# Patient Record
Sex: Female | Born: 1990 | Hispanic: No | Smoking: Never smoker
Health system: Southern US, Community
[De-identification: ages and names within clinical notes are randomized; demographics above are authoritative.]

## PROBLEM LIST (undated history)

## (undated) ENCOUNTER — Inpatient Hospital Stay (HOSPITAL_COMMUNITY): Payer: Self-pay

## (undated) DIAGNOSIS — Z3490 Encounter for supervision of normal pregnancy, unspecified, unspecified trimester: Secondary | ICD-10-CM

## (undated) DIAGNOSIS — E0590 Thyrotoxicosis, unspecified without thyrotoxic crisis or storm: Secondary | ICD-10-CM

---

## 2013-05-02 ENCOUNTER — Inpatient Hospital Stay (HOSPITAL_COMMUNITY): Payer: Medicaid Other

## 2013-05-02 ENCOUNTER — Inpatient Hospital Stay (HOSPITAL_COMMUNITY)
Admission: AD | Admit: 2013-05-02 | Discharge: 2013-05-02 | Disposition: A | Discharge status: Home / Self Care | Payer: Medicaid Other | Source: Ambulatory Visit | Attending: Family Medicine | Admitting: Family Medicine

## 2013-05-02 ENCOUNTER — Encounter (HOSPITAL_COMMUNITY): Payer: Self-pay | Admitting: Family

## 2013-05-02 DIAGNOSIS — O200 Threatened abortion: Secondary | ICD-10-CM

## 2013-05-02 DIAGNOSIS — R109 Unspecified abdominal pain: Secondary | ICD-10-CM | POA: Diagnosis not present

## 2013-05-02 DIAGNOSIS — O209 Hemorrhage in early pregnancy, unspecified: Secondary | ICD-10-CM | POA: Insufficient documentation

## 2013-05-02 LAB — HCG, QUANTITATIVE, PREGNANCY: hCG, Beta Chain, Quant, S: 353 m[IU]/mL — ABNORMAL HIGH (ref <5)

## 2013-05-02 LAB — URINALYSIS, ROUTINE W REFLEX MICROSCOPIC
Bilirubin Urine: NEGATIVE
Nitrite: NEGATIVE
Specific Gravity, Urine: 1.025 (ref 1.005–1.030)
pH: 6 (ref 5.0–8.0)

## 2013-05-02 LAB — URINE MICROSCOPIC-ADD ON

## 2013-05-02 LAB — WET PREP, GENITAL
Clue Cells Wet Prep HPF POC: NONE SEEN
Yeast Wet Prep HPF POC: NONE SEEN

## 2013-05-02 LAB — ABO/RH: ABO/RH(D): A POS

## 2013-05-02 NOTE — MAU Provider Note (Signed)
None     Chief Complaint:  Vaginal Bleeding and Abdominal Pain   ZENITH KERCHEVAL is  22 y.o. G1P1001 at Unknown presents complaining of Vaginal Bleeding and Abdominal Pain She had a home + UPT 4 days ago, and started having cramping and bleeding today.  States that is more than spotting, but less than a period. Obstetrical/Gynecological History: OB History   Grav Para Term Preterm Abortions TAB SAB Ect Mult Living   1 1 1       1      Past Medical History: Past Medical History  Diagnosis Date  . Medical history non-contributory     Past Surgical History: Past Surgical History  Procedure Laterality Date  . Cesarean section      Family History: History reviewed. No pertinent family history.  Social History: History  Substance Use Topics  . Smoking status: Never Smoker   . Smokeless tobacco: Never Used  . Alcohol Use: No    Allergies: No Known Allergies  Meds:  No prescriptions prior to admission    Review of Systems   Constitutional: Negative for fever and chills Eyes: Negative for visual disturbances Respiratory: Negative for shortness of breath, dyspnea Cardiovascular: Negative for chest pain or palpitations  Gastrointestinal: Negative for vomiting, diarrhea and constipation Genitourinary: Negative for dysuria and urgency Musculoskeletal: Negative for back pain, joint pain, myalgias  Neurological: Negative for dizziness and headaches    Physical Exam  Blood pressure 125/73, pulse 92, temperature 98.2 F (36.8 C), temperature source Oral, resp. rate 16, height 5\' 6"  (1.676 m), weight 64.921 kg (143 lb 2 oz), last menstrual period 03/30/2013, SpO2 100.00%. GENERAL: Well-developed, well-nourished female in no acute distress.  LUNGS: Clear to auscultation bilaterally.  HEART: Regular rate and rhythm. ABDOMEN: Soft, nontender, nondistended, gravid.  EXTREMITIES: Nontender, no edema, 2+ distal pulses. DTR's 2+ CERVICAL EXAM:anterior, short, closed, firm     Labs: Results for orders placed during the hospital encounter of 05/02/13 (from the past 24 hour(s))  URINALYSIS, ROUTINE W REFLEX MICROSCOPIC   Collection Time    05/02/13  2:20 PM      Result Value Range   Color, Urine YELLOW  YELLOW   APPearance CLEAR  CLEAR   Specific Gravity, Urine 1.025  1.005 - 1.030   pH 6.0  5.0 - 8.0   Glucose, UA NEGATIVE  NEGATIVE mg/dL   Hgb urine dipstick LARGE (*) NEGATIVE   Bilirubin Urine NEGATIVE  NEGATIVE   Ketones, ur 15 (*) NEGATIVE mg/dL   Protein, ur NEGATIVE  NEGATIVE mg/dL   Urobilinogen, UA 0.2  0.0 - 1.0 mg/dL   Nitrite NEGATIVE  NEGATIVE   Leukocytes, UA NEGATIVE  NEGATIVE  URINE MICROSCOPIC-ADD ON   Collection Time    05/02/13  2:20 PM      Result Value Range   Squamous Epithelial / LPF FEW (*) RARE   WBC, UA 0-2  <3 WBC/hpf   RBC / HPF 7-10  <3 RBC/hpf   Bacteria, UA FEW (*) RARE   Urine-Other MUCOUS PRESENT    HCG, QUANTITATIVE, PREGNANCY   Collection Time    05/02/13  3:40 PM      Result Value Range   hCG, Beta Chain, Quant, S 353 (*) <5 mIU/mL  ABO/RH   Collection Time    05/02/13  3:40 PM      Result Value Range   ABO/RH(D) A POS    WET PREP, GENITAL   Collection Time    05/02/13  4:45 PM  Result Value Range   Yeast Wet Prep HPF POC NONE SEEN  NONE SEEN   Trich, Wet Prep NONE SEEN  NONE SEEN   Clue Cells Wet Prep HPF POC NONE SEEN  NONE SEEN   WBC, Wet Prep HPF POC FEW (*) NONE SEEN   Imaging Studies:   Assessment: ZAYNAB CHIPMAN is  22 y.o. G1P1000, probable early IUP, threatened ab.  Plan: F/U 2 days for repeat HCG; f/u ultrasound 10 days [facility] start [facility]  CRESENZO-DISHMAN,Teddy Pena 7/28/20145:44 PM

## 2013-05-02 NOTE — MAU Note (Signed)
Patient is in with c/o vaginal bleeding that started today. She states that she had cramps yesterday. lmp 03/30/13. She states that she had a positive home pregnancy test 4 days ago.

## 2013-05-02 NOTE — MAU Note (Signed)
Patient presents to MAU with c/o vaginal bleeding since 1330 today. Reports mild menstrual cramp like bleeding earlier today until bleeding began. Denies pain at this time.

## 2013-05-02 NOTE — MAU Provider Note (Signed)
Chart reviewed and agree with management and plan.  

## 2013-05-04 ENCOUNTER — Encounter (HOSPITAL_COMMUNITY): Payer: Self-pay | Admitting: *Deleted

## 2013-05-04 ENCOUNTER — Inpatient Hospital Stay (HOSPITAL_COMMUNITY)
Admission: AD | Admit: 2013-05-04 | Discharge: 2013-05-04 | Disposition: A | Discharge status: Home / Self Care | Payer: Medicaid Other | Source: Ambulatory Visit | Attending: Obstetrics & Gynecology | Admitting: Obstetrics & Gynecology

## 2013-05-04 DIAGNOSIS — O4690 Antepartum hemorrhage, unspecified, unspecified trimester: Secondary | ICD-10-CM

## 2013-05-04 DIAGNOSIS — O99891 Other specified diseases and conditions complicating pregnancy: Secondary | ICD-10-CM | POA: Insufficient documentation

## 2013-05-04 DIAGNOSIS — O209 Hemorrhage in early pregnancy, unspecified: Secondary | ICD-10-CM

## 2013-05-04 LAB — HCG, QUANTITATIVE, PREGNANCY: hCG, Beta Chain, Quant, S: 899 m[IU]/mL — ABNORMAL HIGH (ref <5)

## 2013-05-04 NOTE — MAU Provider Note (Signed)
  History     CSN: [id number]  Arrival date and time: 05/04/13 1554   None     No chief complaint on file.  HPI Nicole Love is 22 y.o. G1P1001 Unknown weeks presenting for repeat BHCG.  She was seen 7/28--BHCG 353, blood type A+ and U/S showed possible early IUGS.      Past Medical History  Diagnosis Date  . Medical history non-contributory     Past Surgical History  Procedure Laterality Date  . Cesarean section      No family history on file.  History  Substance Use Topics  . Smoking status: Never Smoker   . Smokeless tobacco: Never Used  . Alcohol Use: No    Allergies: No Known Allergies  No prescriptions prior to admission    ROS Physical Exam   Last menstrual period 03/30/2013.  Physical Exam  MAU Course  Procedures  MDM  BHCG pending.  Care turned over to J. Either, PA at 20:10  Assessment and Plan    Sorren Vallier,EVE M 05/04/2013, 4:01 PM

## 2013-05-04 NOTE — MAU Note (Signed)
Here for rpt BHCG.  No pain, less bleeding now- just spotting.

## 2013-05-04 NOTE — Discharge Instructions (Signed)
Pelvic Rest °Pelvic rest is sometimes recommended for women when:  °· The placenta is partially or completely covering the opening of the cervix (placenta previa). °· There is bleeding between the uterine wall and the amniotic sac in the first trimester (subchorionic hemorrhage). °· The cervix begins to open without labor starting (incompetent cervix, cervical insufficiency). °· The labor is too early (preterm labor). °HOME CARE INSTRUCTIONS °· Do not have sexual intercourse, stimulation, or an orgasm. °· Do not use tampons, douche, or put anything in the vagina. °· Do not lift anything over 10 pounds (4.5 kg). °· Avoid strenuous activity or straining your pelvic muscles. °SEEK MEDICAL CARE IF:  °· You have any vaginal bleeding during pregnancy. Treat this as a potential emergency. °· You have cramping pain felt low in the stomach (stronger than menstrual cramps). °· You notice vaginal discharge (watery, mucus, or bloody). °· You have a low, dull backache. °· There are regular contractions or uterine tightening. °SEEK IMMEDIATE MEDICAL CARE IF: °You have vaginal bleeding and have placenta previa.  °Document Released: 01/17/2011 Document Revised: 12/15/2011 Document Reviewed: 01/17/2011 °ExitCare® Patient Information ©2014 [employer]. ° °

## 2013-05-11 ENCOUNTER — Inpatient Hospital Stay (HOSPITAL_COMMUNITY)
Admission: AD | Admit: 2013-05-11 | Discharge: 2013-05-11 | Disposition: A | Discharge status: Home / Self Care | Payer: Medicaid - Out of State | Source: Ambulatory Visit | Attending: Obstetrics & Gynecology | Admitting: Obstetrics & Gynecology

## 2013-05-11 ENCOUNTER — Ambulatory Visit (HOSPITAL_COMMUNITY)
Admission: RE | Admit: 2013-05-11 | Discharge: 2013-05-11 | Disposition: A | Discharge status: Home / Self Care | Payer: Medicaid Other | Source: Ambulatory Visit | Attending: Medical | Admitting: Medical

## 2013-05-11 ENCOUNTER — Encounter (HOSPITAL_COMMUNITY): Payer: Self-pay | Admitting: Medical

## 2013-05-11 DIAGNOSIS — O368390 Maternal care for abnormalities of the fetal heart rate or rhythm, unspecified trimester, not applicable or unspecified: Secondary | ICD-10-CM | POA: Insufficient documentation

## 2013-05-11 DIAGNOSIS — O209 Hemorrhage in early pregnancy, unspecified: Secondary | ICD-10-CM

## 2013-05-11 DIAGNOSIS — O4690 Antepartum hemorrhage, unspecified, unspecified trimester: Secondary | ICD-10-CM

## 2013-05-11 DIAGNOSIS — Z3689 Encounter for other specified antenatal screening: Secondary | ICD-10-CM | POA: Insufficient documentation

## 2013-05-11 DIAGNOSIS — O26859 Spotting complicating pregnancy, unspecified trimester: Secondary | ICD-10-CM | POA: Insufficient documentation

## 2013-05-11 DIAGNOSIS — O34219 Maternal care for unspecified type scar from previous cesarean delivery: Secondary | ICD-10-CM | POA: Insufficient documentation

## 2013-05-11 DIAGNOSIS — O3680X0 Pregnancy with inconclusive fetal viability, not applicable or unspecified: Secondary | ICD-10-CM | POA: Insufficient documentation

## 2013-05-11 NOTE — MAU Provider Note (Signed)
Ms. Nicole Love is a 22 y.o. G2P1001 at Unknown who presents to MAU today for follow-up US results. Patient reports continued spotting since last visit that is improving. Denies pain.   LMP 03/30/2013 GENERAL: Well-developed, well-nourished female in no acute distress.  HEENT: Normocephalic, atraumatic.   LUNGS: Effort normal HEART: Regular rate  SKIN: Warm, dry and without erythema PSYCH: Normal mood and affect    A: IUP at [redacted]w[redacted]d with fetal bradycardia  P: Discharge home First trimester warning signs reviewed Patient plans to start prenatal care at [facility] Pregnancy confirmation letter and Medicaid assistance information given Patient may return to MAU as needed or if her condition were to change or worsen  Freddi Starr, PA-C 05/11/2013 10:05 AM

## 2013-05-25 ENCOUNTER — Ambulatory Visit (INDEPENDENT_AMBULATORY_CARE_PROVIDER_SITE_OTHER): Payer: Medicaid Other | Admitting: Advanced Practice Midwife

## 2013-05-25 ENCOUNTER — Encounter: Payer: Self-pay | Admitting: Advanced Practice Midwife

## 2013-05-25 VITALS — BP 123/88 | Temp 97.8°F | Wt 140.1 lb

## 2013-05-25 DIAGNOSIS — O34219 Maternal care for unspecified type scar from previous cesarean delivery: Secondary | ICD-10-CM

## 2013-05-25 LAB — POCT URINALYSIS DIP (DEVICE)
Glucose, UA: NEGATIVE mg/dL
Ketones, ur: 40 mg/dL — AB
Leukocytes, UA: NEGATIVE
Specific Gravity, Urine: >=1.03 (ref 1.005–1.030)

## 2013-05-25 LAB — HIV ANTIBODY (ROUTINE TESTING W REFLEX): HIV: NONREACTIVE

## 2013-05-25 LAB — OB RESULTS CONSOLE GC/CHLAMYDIA
Chlamydia: NEGATIVE
Gonorrhea: NEGATIVE

## 2013-05-25 MED ORDER — PROMETHAZINE HCL 12.5 MG PO TABS: 12.5000 mg | ORAL_TABLET | Freq: Four times a day (QID) | ORAL | Status: DC | PRN

## 2013-05-25 MED ORDER — PRENATAL VITAMINS 0.8 MG PO TABS: 1.0000 | ORAL_TABLET | Freq: Every day | ORAL | Status: DC

## 2013-05-25 NOTE — Progress Notes (Signed)
First screen scheduled @ [facility] on 9/18 @ 0900

## 2013-05-25 NOTE — Progress Notes (Signed)
Pulse- 89 Weight gain 25-35lbs New ob packet given

## 2013-05-25 NOTE — Progress Notes (Signed)
S: Nicole Love is a 22 y.o. G2P1001 at [redacted]w[redacted]d who presents today for her initial prenatal visit. She has a c-section for FTP with her first pregnancy. She was 4cm at the time of the c-section. She was induced due to post-dates. She is interested in trying a VBAC. She denies any other complications with that pregnancy. She is planning on breastfeeding. She is having some nausea, and states that she has lost about 5lbs due to nausea at this time, and would like a RX to help.  O: VS reviewed Abd: soft, NT External: no lesion Vagina: small amount of white discharge Cervix: pink, smooth, no CMT Uterus: 8 weeks A/P: G2P1001 at [redacted]w[redacted]d Initial prenatal  Desires: FIRST screen, AFP and anatomy scan.  Labs today  Will schedule FIRST screen RX: phenergan and PNV 75% of 45 min visit spent in counseling.

## 2013-05-26 LAB — OBSTETRIC PANEL
Antibody Screen: NEGATIVE
Basophils Absolute: 0 10*3/uL (ref 0.0–0.1)
Basophils Relative: 0 % (ref 0–1)
Eosinophils Absolute: 0.1 10*3/uL (ref 0.0–0.7)
Eosinophils Relative: 1 % (ref 0–5)
Lymphocytes Relative: 24 % (ref 12–46)
MCHC: 35.5 g/dL (ref 30.0–36.0)
MCV: 90.3 fL (ref 78.0–100.0)
Monocytes Absolute: 0.7 10*3/uL (ref 0.1–1.0)
Platelets: 232 10*3/uL (ref 150–400)
RDW: 12.9 % (ref 11.5–15.5)
Rubella: 4.01 Index — ABNORMAL HIGH (ref <0.90)
WBC: 7.8 10*3/uL (ref 4.0–10.5)

## 2013-06-22 ENCOUNTER — Encounter: Payer: Self-pay | Admitting: Obstetrics and Gynecology

## 2013-06-22 ENCOUNTER — Other Ambulatory Visit: Payer: Self-pay | Admitting: Obstetrics and Gynecology

## 2013-06-22 DIAGNOSIS — Z3682 Encounter for antenatal screening for nuchal translucency: Secondary | ICD-10-CM

## 2013-06-23 ENCOUNTER — Ambulatory Visit (HOSPITAL_COMMUNITY): Payer: Medicaid Other

## 2013-06-23 ENCOUNTER — Ambulatory Visit (HOSPITAL_COMMUNITY): Payer: Medicaid Other | Attending: Obstetrics and Gynecology

## 2013-07-19 ENCOUNTER — Encounter: Payer: Self-pay | Admitting: Family Medicine

## 2013-07-19 ENCOUNTER — Ambulatory Visit (INDEPENDENT_AMBULATORY_CARE_PROVIDER_SITE_OTHER): Payer: Medicaid Other | Admitting: Family Medicine

## 2013-07-19 VITALS — BP 112/79 | Temp 98.5°F | Wt 137.0 lb

## 2013-07-19 DIAGNOSIS — Z3480 Encounter for supervision of other normal pregnancy, unspecified trimester: Secondary | ICD-10-CM | POA: Insufficient documentation

## 2013-07-19 DIAGNOSIS — Z23 Encounter for immunization: Secondary | ICD-10-CM

## 2013-07-19 DIAGNOSIS — O34219 Maternal care for unspecified type scar from previous cesarean delivery: Secondary | ICD-10-CM

## 2013-07-19 DIAGNOSIS — Z3481 Encounter for supervision of other normal pregnancy, first trimester: Secondary | ICD-10-CM

## 2013-07-19 LAB — POCT URINALYSIS DIP (DEVICE)
Glucose, UA: NEGATIVE mg/dL
Hgb urine dipstick: NEGATIVE
Nitrite: NEGATIVE
Specific Gravity, Urine: >=1.03 (ref 1.005–1.030)
Urobilinogen, UA: 1 mg/dL (ref 0.0–1.0)
pH: 6 (ref 5.0–8.0)

## 2013-07-19 MED ORDER — PROMETHAZINE HCL 12.5 MG PO TABS: 12.5000 mg | ORAL_TABLET | Freq: Four times a day (QID) | ORAL | Status: DC | PRN

## 2013-07-19 MED ORDER — PRENATAL 19 PO CHEW: 1.0000 | CHEWABLE_TABLET | Freq: Every day | ORAL | Status: DC

## 2013-07-19 NOTE — Progress Notes (Signed)
P=91  Pt states that Prenatal vitamin is making her sick/desires different prescription. Phenergan prescription is in need of a new prescription

## 2013-07-19 NOTE — Patient Instructions (Signed)
Pregnancy - Second Trimester The second trimester of pregnancy (3 to 6 months) is a period of rapid growth for you and your baby. At the end of the sixth month, your baby is about 9 inches long and weighs 1 1/2 pounds. You will begin to feel the baby move between 18 and 20 weeks of the pregnancy. This is called quickening. Weight gain is faster. A clear fluid (colostrum) may leak out of your breasts. You may feel small contractions of the womb (uterus). This is known as false labor or Braxton-Hicks contractions. This is like a practice for labor when the baby is ready to be born. Usually, the problems with morning sickness have usually passed by the end of your first trimester. Some women develop small dark blotches (called cholasma, mask of pregnancy) on their face that usually goes away after the baby is born. Exposure to the sun makes the blotches worse. Acne may also develop in some pregnant women and pregnant women who have acne, may find that it goes away. PRENATAL EXAMS  Blood work may continue to be done during prenatal exams. These tests are done to check on your health and the probable health of your baby. Blood work is used to follow your blood levels (hemoglobin). Anemia (low hemoglobin) is common during pregnancy. Iron and vitamins are given to help prevent this. You will also be checked for diabetes between 24 and 28 weeks of the pregnancy. Some of the previous blood tests may be repeated.  The size of the uterus is measured during each visit. This is to make sure that the baby is continuing to grow properly according to the dates of the pregnancy.  Your blood pressure is checked every prenatal visit. This is to make sure you are not getting toxemia.  Your urine is checked to make sure you do not have an infection, diabetes or protein in the urine.  Your weight is checked often to make sure gains are happening at the suggested rate. This is to ensure that both you and your baby are  growing normally.  Sometimes, an ultrasound is performed to confirm the proper growth and development of the baby. This is a test which bounces harmless sound waves off the baby so your caregiver can more accurately determine due dates. Sometimes, a test is done on the amniotic fluid surrounding the baby. This test is called an amniocentesis. The amniotic fluid is obtained by sticking a needle into the belly (abdomen). This is done to check the chromosomes in instances where there is a concern about possible genetic problems with the baby. It is also sometimes done near the end of pregnancy if an early delivery is required. In this case, it is done to help make sure the baby's lungs are mature enough for the baby to live outside of the womb. CHANGES OCCURING IN THE SECOND TRIMESTER OF PREGNANCY Your body goes through many changes during pregnancy. They vary from person to person. Talk to your caregiver about changes you notice that you are concerned about.  During the second trimester, you will likely have an increase in your appetite. It is normal to have cravings for certain foods. This varies from person to person and pregnancy to pregnancy.  Your lower abdomen will begin to bulge.  You may have to urinate more often because the uterus and baby are pressing on your bladder. It is also common to get more bladder infections during pregnancy. You can help this by drinking lots of fluids   and emptying your bladder before and after intercourse.  You may begin to get stretch marks on your hips, abdomen, and breasts. These are normal changes in the body during pregnancy. There are no exercises or medicines to take that prevent this change.  You may begin to develop swollen and bulging veins (varicose veins) in your legs. Wearing support hose, elevating your feet for 15 minutes, 3 to 4 times a day and limiting salt in your diet helps lessen the problem.  Heartburn may develop as the uterus grows and  pushes up against the stomach. Antacids recommended by your caregiver helps with this problem. Also, eating smaller meals 4 to 5 times a day helps.  Constipation can be treated with a stool softener or adding bulk to your diet. Drinking lots of fluids, and eating vegetables, fruits, and whole grains are helpful.  Exercising is also helpful. If you have been very active up until your pregnancy, most of these activities can be continued during your pregnancy. If you have been less active, it is helpful to start an exercise program such as walking.  Hemorrhoids may develop at the end of the second trimester. Warm sitz baths and hemorrhoid cream recommended by your caregiver helps hemorrhoid problems.  Backaches may develop during this time of your pregnancy. Avoid heavy lifting, wear low heal shoes, and practice good posture to help with backache problems.  Some pregnant women develop tingling and numbness of their hand and fingers because of swelling and tightening of ligaments in the wrist (carpel tunnel syndrome). This goes away after the baby is born.  As your breasts enlarge, you may have to get a bigger bra. Get a comfortable, cotton, support bra. Do not get a nursing bra until the last month of the pregnancy if you will be nursing the baby.  You may get a dark line from your belly button to the pubic area called the linea nigra.  You may develop rosy cheeks because of increase blood flow to the face.  You may develop spider looking lines of the face, neck, arms, and chest. These go away after the baby is born. HOME CARE INSTRUCTIONS   It is extremely important to avoid all smoking, herbs, alcohol, and unprescribed drugs during your pregnancy. These chemicals affect the formation and growth of the baby. Avoid these chemicals throughout the pregnancy to ensure the delivery of a healthy infant.  Most of your home care instructions are the same as suggested for the first trimester of your  pregnancy. Keep your caregiver's appointments. Follow your caregiver's instructions regarding medicine use, exercise, and diet.  During pregnancy, you are providing food for you and your baby. Continue to eat regular, well-balanced meals. Choose foods such as meat, fish, milk and other low fat dairy products, vegetables, fruits, and whole-grain breads and cereals. Your caregiver will tell you of the ideal weight gain.  A physical sexual relationship may be continued up until near the end of pregnancy if there are no other problems. Problems could include early (premature) leaking of amniotic fluid from the membranes, vaginal bleeding, abdominal pain, or other medical or pregnancy problems.  Exercise regularly if there are no restrictions. Check with your caregiver if you are unsure of the safety of some of your exercises. The greatest weight gain will occur in the last 2 trimesters of pregnancy. Exercise will help you:  Control your weight.  Get you in shape for labor and delivery.  Lose weight after you have the baby.  Wear   a good support or jogging bra for breast tenderness during pregnancy. This may help if worn during sleep. Pads or tissues may be used in the bra if you are leaking colostrum.  Do not use hot tubs, steam rooms or saunas throughout the pregnancy.  Wear your seat belt at all times when driving. This protects you and your baby if you are in an accident.  Avoid raw meat, uncooked cheese, cat litter boxes, and soil used by cats. These carry germs that can cause birth defects in the baby.  The second trimester is also a good time to visit your dentist for your dental health if this has not been done yet. Getting your teeth cleaned is okay. Use a soft toothbrush. Brush gently during pregnancy.  It is easier to leak urine during pregnancy. Tightening up and strengthening the pelvic muscles will help with this problem. Practice stopping your urination while you are going to the  bathroom. These are the same muscles you need to strengthen. It is also the muscles you would use as if you were trying to stop from passing gas. You can practice tightening these muscles up 10 times a set and repeating this about 3 times per day. Once you know what muscles to tighten up, do not perform these exercises during urination. It is more likely to contribute to an infection by backing up the urine.  Ask for help if you have financial, counseling, or nutritional needs during pregnancy. Your caregiver will be able to offer counseling for these needs as well as refer you for other special needs.  Your skin may become oily. If so, wash your face with mild soap, use non-greasy moisturizer and oil or cream based makeup. MEDICINES AND DRUG USE IN PREGNANCY  Take prenatal vitamins as directed. The vitamin should contain 1 milligram of folic acid. Keep all vitamins out of reach of children. Only a couple vitamins or tablets containing iron may be fatal to a baby or young child when ingested.  Avoid use of all medicines, including herbs, over-the-counter medicines, not prescribed or suggested by your caregiver. Only take over-the-counter or prescription medicines for pain, discomfort, or fever as directed by your caregiver. Do not use aspirin.  Let your caregiver also know about herbs you may be using.  Alcohol is related to a number of birth defects. This includes fetal alcohol syndrome. All alcohol, in any form, should be avoided completely. Smoking will cause low birth rate and premature babies.  Street or illegal drugs are very harmful to the baby. They are absolutely forbidden. A baby born to an addicted mother will be addicted at birth. The baby will go through the same withdrawal an adult does. SEEK MEDICAL CARE IF:  You have any concerns or worries during your pregnancy. It is better to call with your questions if you feel they cannot wait, rather than worry about them. SEEK IMMEDIATE  MEDICAL CARE IF:   An unexplained oral temperature above 102 F (38.9 C) develops, or as your caregiver suggests.  You have leaking of fluid from the vagina (birth canal). If leaking membranes are suspected, take your temperature and tell your caregiver of this when you call.  There is vaginal spotting, bleeding, or passing clots. Tell your caregiver of the amount and how many pads are used. Light spotting in pregnancy is common, especially following intercourse.  You develop a bad smelling vaginal discharge with a change in the color from clear to white.  You continue to feel   sick to your stomach (nauseated) and have no relief from remedies suggested. You vomit blood or coffee ground-like materials.  You lose more than 2 pounds of weight or gain more than 2 pounds of weight over 1 week, or as suggested by your caregiver.  You notice swelling of your face, hands, feet, or legs.  You get exposed to German measles and have never had them.  You are exposed to fifth disease or chickenpox.  You develop belly (abdominal) pain. Round ligament discomfort is a common non-cancerous (benign) cause of abdominal pain in pregnancy. Your caregiver still must evaluate you.  You develop a bad headache that does not go away.  You develop fever, diarrhea, pain with urination, or shortness of breath.  You develop visual problems, blurry, or double vision.  You fall or are in a car accident or any kind of trauma.  There is mental or physical violence at home. Document Released: 09/16/2001 Document Revised: 06/16/2012 Document Reviewed: 03/21/2009 ExitCare Patient Information 2014 [employer].  

## 2013-07-19 NOTE — Progress Notes (Signed)
22 yo G2P1001 @ [redacted]w[redacted]d here for ROBV - hx of c/s due to FTP past 4cm (IOL at 41 weeks) - really wants to VBAC - nervous. No ctx, vb, lof.  O: see flowheet  A/P: - doing well. Nervous but excited to TOLAC - VBAC consent signed today - rx of phenergan refilled and PNV also refilled - VB precautions discussed.

## 2013-07-21 LAB — ALPHA FETOPROTEIN, MATERNAL
Curr Gest Age: 15.6 wks.days
Osb Risk: 1:16100 {titer}

## 2013-07-22 ENCOUNTER — Encounter: Payer: Self-pay | Admitting: *Deleted

## 2013-07-22 DIAGNOSIS — Z3480 Encounter for supervision of other normal pregnancy, unspecified trimester: Secondary | ICD-10-CM

## 2013-07-28 ENCOUNTER — Other Ambulatory Visit: Payer: Self-pay

## 2013-07-28 MED ORDER — PROMETHAZINE HCL 25 MG PO TABS: 12.5000 mg | ORAL_TABLET | Freq: Four times a day (QID) | ORAL | Status: DC | PRN

## 2013-07-28 NOTE — Telephone Encounter (Signed)
[facility] pharmacy faxed [place] a refill to change to the order to phenergan 25 mg then pt can half the tab so that it would be cheaper cost to the patient.

## 2013-08-16 ENCOUNTER — Encounter: Payer: Medicaid Other | Admitting: Family Medicine

## 2013-09-07 ENCOUNTER — Encounter: Payer: Self-pay | Admitting: *Deleted

## 2014-03-09 ENCOUNTER — Encounter (HOSPITAL_COMMUNITY): Payer: Self-pay | Admitting: *Deleted

## 2014-08-07 ENCOUNTER — Encounter (HOSPITAL_COMMUNITY): Payer: Self-pay | Admitting: *Deleted

## 2017-01-23 ENCOUNTER — Ambulatory Visit (HOSPITAL_COMMUNITY)
Admission: EM | Admit: 2017-01-23 | Discharge: 2017-01-23 | Disposition: A | Discharge status: Home / Self Care | Payer: Medicaid Other | Attending: Internal Medicine | Admitting: Internal Medicine

## 2017-01-23 ENCOUNTER — Encounter (HOSPITAL_COMMUNITY): Payer: Self-pay | Admitting: Emergency Medicine

## 2017-01-23 DIAGNOSIS — H9201 Otalgia, right ear: Secondary | ICD-10-CM

## 2017-01-23 DIAGNOSIS — J302 Other seasonal allergic rhinitis: Secondary | ICD-10-CM | POA: Diagnosis not present

## 2017-01-23 MED ORDER — MONTELUKAST SODIUM 10 MG PO TABS: 10.0000 mg | ORAL_TABLET | Freq: Every day | ORAL | 2 refills | Status: DC

## 2017-01-23 NOTE — ED Triage Notes (Signed)
The patient presented to the [facility] with a complaint of right ear pain that started 2 days ago.

## 2017-01-23 NOTE — Discharge Instructions (Signed)
Your ear is not infected, however there is fluid build up most likely related to allergies. I recommend using flonase two sprays each nostril daily, along with an OTC antihistamine such as claritin or zyrtec every day. I have prescribed a medicine called singulair, take one tablet every night at bedtime. If your symptoms persist past 1 week follow up with primary care or return to clinic.

## 2017-01-23 NOTE — ED Provider Notes (Signed)
CSN: [id number]     Arrival date & time 01/23/17  1836 History   First MD Initiated Contact with Patient 01/23/17 1953     Chief Complaint  Patient presents with  . Otalgia   (Consider location/radiation/quality/duration/timing/severity/associated sxs/prior Treatment) 26 year old female presents to clinic for evaluation of right ear pain.   The history is provided by the patient.  Otalgia  Location:  Right Behind ear:  No abnormality Quality:  Pressure Severity:  Mild Onset quality:  Gradual Duration:  1 day Timing:  Constant Progression:  Worsening Chronicity:  New Context: not direct blow, not elevation change, not foreign body in ear, not loud noise, not recent URI and not water in ear   Relieved by:  Nothing Worsened by:  Nothing Ineffective treatments:  OTC medications Associated symptoms: congestion and rhinorrhea   Associated symptoms: no abdominal pain, no ear discharge, no fever, no headaches, no rash, no sore throat, no tinnitus and no vomiting   Congestion:    Location:  Nasal   Interferes with sleep: no     Interferes with eating/drinking: no   Rhinorrhea:    Quality:  Clear   Severity:  Moderate   Duration:  2 weeks   Timing:  Constant   Past Medical History:  Diagnosis Date  . Medical history non-contributory    Past Surgical History:  Procedure Laterality Date  . CESAREAN SECTION     Family History  Problem Relation Age of Onset  . Diabetes Maternal Grandmother    Social History  Substance Use Topics  . Smoking status: Never Smoker  . Smokeless tobacco: Never Used  . Alcohol use No   OB History    Gravida Para Term Preterm AB Living   SAB TAB Ectopic Multiple Live Births           1     Review of Systems  Constitutional: Negative for chills and fever.  HENT: Positive for congestion, ear pain, rhinorrhea and sneezing. Negative for ear discharge, sore throat and tinnitus.   Eyes: Positive for redness and itching.   Respiratory: Negative.   Cardiovascular: Negative.   Gastrointestinal: Negative for abdominal pain, nausea and vomiting.  Genitourinary: Negative.   Musculoskeletal: Negative.   Skin: Negative.  Negative for color change and rash.  Neurological: Negative for light-headedness and headaches.    Allergies  Patient has no known allergies.  Home Medications   Prior to Admission medications   Medication Sig Start Date End Date Taking? Authorizing Provider  montelukast (SINGULAIR) 10 MG tablet Take 1 tablet (10 mg total) by mouth at bedtime. 01/23/17   Dorena Bodo, NP   Meds Ordered and Administered this Visit  Medications - No data to display  BP (!) 144/82 (BP Location: Right Arm)   Pulse 72   Temp 98.4 F (36.9 C) (Oral)   Resp 18   LMP 01/23/2017   SpO2 100%   Breastfeeding? No  No data found.   Physical Exam  Constitutional: She is oriented to person, place, and time. She appears well-developed and well-nourished. No distress.  HENT:  Head: Normocephalic and atraumatic.  Right Ear: External ear normal. A middle ear effusion is present.  Left Ear: Tympanic membrane and external ear normal.  Eyes: Conjunctivae are normal. Right eye exhibits no discharge. Left eye exhibits no discharge.  Cardiovascular: Normal rate and regular rhythm.   Pulmonary/Chest: Effort normal and breath sounds normal.  Neurological: She is  alert and oriented to person, place, and time.  Skin: Skin is warm and dry. Capillary refill takes less than 2 seconds. No rash noted. She is not diaphoretic. No erythema.  Psychiatric: She has a normal mood and affect. Her behavior is normal.  Nursing note and vitals reviewed.   Urgent Care Course     Procedures (including critical care time)  Labs Review Labs Reviewed - No data to display  Imaging Review No results found.   MDM   1. Seasonal allergic rhinitis, unspecified trigger   2. Right ear pain    Seasonal allergies. Start Flonase,  Zyrtec, and Singulair. Follow up with primary care or return to clinic in one week as needed     Dorena Bodo, NP 01/23/17 2038

## 2017-04-20 ENCOUNTER — Encounter (HOSPITAL_COMMUNITY): Payer: Self-pay

## 2017-04-20 ENCOUNTER — Inpatient Hospital Stay (HOSPITAL_COMMUNITY)
Admission: AD | Admit: 2017-04-20 | Discharge: 2017-04-20 | Disposition: A | Discharge status: Home / Self Care | Payer: Medicaid Other | Source: Ambulatory Visit | Attending: Obstetrics & Gynecology | Admitting: Obstetrics & Gynecology

## 2017-04-20 ENCOUNTER — Inpatient Hospital Stay (HOSPITAL_COMMUNITY): Payer: Medicaid Other

## 2017-04-20 DIAGNOSIS — O209 Hemorrhage in early pregnancy, unspecified: Secondary | ICD-10-CM | POA: Diagnosis not present

## 2017-04-20 DIAGNOSIS — Z3A01 Less than 8 weeks gestation of pregnancy: Secondary | ICD-10-CM | POA: Insufficient documentation

## 2017-04-20 DIAGNOSIS — O210 Mild hyperemesis gravidarum: Secondary | ICD-10-CM | POA: Diagnosis not present

## 2017-04-20 DIAGNOSIS — O219 Vomiting of pregnancy, unspecified: Secondary | ICD-10-CM

## 2017-04-20 DIAGNOSIS — O4691 Antepartum hemorrhage, unspecified, first trimester: Secondary | ICD-10-CM | POA: Insufficient documentation

## 2017-04-20 HISTORY — DX: Thyrotoxicosis, unspecified without thyrotoxic crisis or storm: E05.90

## 2017-04-20 LAB — CBC
HCT: 38.5 % (ref 36.0–46.0)
Hemoglobin: 13.5 g/dL (ref 12.0–15.0)
MCH: 30.4 pg (ref 26.0–34.0)
MCHC: 35.1 g/dL (ref 30.0–36.0)
MCV: 86.7 fL (ref 78.0–100.0)
PLATELETS: 240 10*3/uL (ref 150–400)
RBC: 4.44 MIL/uL (ref 3.87–5.11)
RDW: 12.7 % (ref 11.5–15.5)
WBC: 9.8 10*3/uL (ref 4.0–10.5)

## 2017-04-20 LAB — URINALYSIS, ROUTINE W REFLEX MICROSCOPIC
Glucose, UA: NEGATIVE mg/dL
Ketones, ur: 80 mg/dL — AB
NITRITE: NEGATIVE
Protein, ur: 100 mg/dL — AB
Specific Gravity, Urine: 1.039 — ABNORMAL HIGH (ref 1.005–1.030)
pH: 5 (ref 5.0–8.0)

## 2017-04-20 LAB — WET PREP, GENITAL
Clue Cells Wet Prep HPF POC: NONE SEEN
Sperm: NONE SEEN
TRICH WET PREP: NONE SEEN
YEAST WET PREP: NONE SEEN

## 2017-04-20 LAB — POCT PREGNANCY, URINE: PREG TEST UR: POSITIVE — AB

## 2017-04-20 LAB — HCG, QUANTITATIVE, PREGNANCY: hCG, Beta Chain, Quant, S: 30201 m[IU]/mL — ABNORMAL HIGH (ref <5)

## 2017-04-20 MED ORDER — PROMETHAZINE HCL 12.5 MG PO TABS: 12.5000 mg | ORAL_TABLET | Freq: Four times a day (QID) | ORAL | 0 refills | Status: DC | PRN

## 2017-04-20 NOTE — MAU Provider Note (Signed)
History     CSN: [id number]  Arrival date and time: 04/20/17 1207   None     No chief complaint on file.  HPI Nicole Love is 26 y.o. [id number] Unknown weeks presenting with vaginal bleeding without abdominal pain.  States she delivered in [place] 4 months ago and bled for about 1 month.  Had periods in May and June that she states were longer than normal.  July period began at expected time on 7/6 and she has continued to bleed.  Blood has an odor. + for Nausea.  Vomited X 1 today.  Hx of N&V with previous pregnancy, she used Zofran and Diclegis that made her sxs worse. Has been sexually active without contraception since delivery. She was told by her endocrinologist per her report that she only makes an egg q 3 years so she has not every used contraception   Past Medical History:  Diagnosis Date  . Hyperthyroidism     Past Surgical History:  Procedure Laterality Date  . CESAREAN SECTION      Family History  Problem Relation Age of Onset  . Diabetes Maternal Grandmother     Social History  Substance Use Topics  . Smoking status: Never Smoker  . Smokeless tobacco: Never Used  . Alcohol use No    Allergies: No Known Allergies  No prescriptions prior to admission.    Review of Systems  Constitutional: Negative for chills and fever.  Respiratory: Negative for shortness of breath.   Cardiovascular: Negative for chest pain.  Gastrointestinal: Positive for nausea and vomiting (X 1 today.). Negative for abdominal pain.  Genitourinary: Positive for vaginal bleeding. Negative for difficulty urinating, dysuria, flank pain, frequency, pelvic pain and vaginal pain.  Neurological: Negative for dizziness.   Physical Exam   Blood pressure 120/73, pulse 86, temperature 97.6 F (36.4 C), temperature source Oral, resp. rate 18, height 5\' 6"  (1.676 m), weight 137 lb 12 oz (62.5 kg), last menstrual period 04/10/2017, SpO2 100 %, not currently breastfeeding.  Physical Exam  Nursing  note and vitals reviewed. Constitutional: She is oriented to person, place, and time. She appears well-developed and well-nourished. No distress.  HENT:  Head: Normocephalic.  Neck: Normal range of motion.  Cardiovascular: Normal rate.   Respiratory: Effort normal.  GI: Soft. She exhibits no distension and no mass. There is no tenderness. There is no rebound and no guarding.  Genitourinary: There is no rash, tenderness or lesion on the right labia. There is no rash, tenderness or lesion on the left labia. Uterus is not tender. Cervix exhibits no motion tenderness, no discharge and no friability. Right adnexum displays no mass, no tenderness and no fullness. Left adnexum displays no mass, no tenderness and no fullness. No tenderness or bleeding (negative for active bleeding) in the vagina. Vaginal discharge: brownish red discharge without clots.  Neurological: She is alert and oriented to person, place, and time.  Skin: Skin is warm and dry.  Psychiatric: She has a normal mood and affect. Her behavior is normal. Thought content normal.   Results for orders placed or performed during the hospital encounter of 04/20/17 (from the past 24 hour(s))  Urinalysis, Routine w reflex microscopic     Status: Abnormal   Collection Time: 04/20/17  1:00 PM  Result Value Ref Range   Color, Urine AMBER (A) YELLOW   APPearance HAZY (A) CLEAR   Specific Gravity, Urine 1.039 (H) 1.005 - 1.030   pH 5.0 5.0 - 8.0   Glucose, UA  NEGATIVE NEGATIVE mg/dL   Hgb urine dipstick SMALL (A) NEGATIVE   Bilirubin Urine SMALL (A) NEGATIVE   Ketones, ur 80 (A) NEGATIVE mg/dL   Protein, ur 578100 (A) NEGATIVE mg/dL   Nitrite NEGATIVE NEGATIVE   Leukocytes, UA TRACE (A) NEGATIVE   RBC / HPF 0-5 0 - 5 RBC/hpf   WBC, UA 0-5 0 - 5 WBC/hpf   Bacteria, UA RARE (A) NONE SEEN   Squamous Epithelial / LPF 6-30 (A) NONE SEEN   Mucous PRESENT   Pregnancy, urine POC     Status: Abnormal   Collection Time: 04/20/17  1:06 PM  Result  Value Ref Range   Preg Test, Ur POSITIVE (A) NEGATIVE  CBC     Status: None   Collection Time: 04/20/17  1:22 PM  Result Value Ref Range   WBC 9.8 4.0 - 10.5 K/uL   RBC 4.44 3.87 - 5.11 MIL/uL   Hemoglobin 13.5 12.0 - 15.0 g/dL   HCT 46.938.5 62.936.0 - 52.846.0 %   MCV 86.7 78.0 - 100.0 fL   MCH 30.4 26.0 - 34.0 pg   MCHC 35.1 30.0 - 36.0 g/dL   RDW 41.312.7 24.411.5 - 01.015.5 %   Platelets 240 150 - 400 K/uL  hCG, quantitative, pregnancy     Status: Abnormal   Collection Time: 04/20/17  1:22 PM  Result Value Ref Range   hCG, Beta Chain, Quant, S 30,201 (H) <5 mIU/mL  Wet prep, genital     Status: Abnormal   Collection Time: 04/20/17  1:25 PM  Result Value Ref Range   Yeast Wet Prep HPF POC NONE SEEN NONE SEEN   Trich, Wet Prep NONE SEEN NONE SEEN   Clue Cells Wet Prep HPF POC NONE SEEN NONE SEEN   WBC, Wet Prep HPF POC FEW (A) NONE SEEN   Sperm NONE SEEN    BLOOD TYPE from previous visit A Positive  [place] Ob Comp Less 14 Wks  Result Date: 04/20/2017 CLINICAL DATA:  Early pregnancy.  Bleeding. EXAM: OBSTETRIC <14 WK US AND TRANSVAGINAL OB US TECHNIQUE: Both transabdominal and transvaginal ultrasound examinations were performed for complete evaluation of the gestation as well as the maternal uterus, adnexal regions, and pelvic cul-de-sac. Transvaginal technique was performed to assess early pregnancy. COMPARISON:  None. FINDINGS: Intrauterine gestational sac: Single Yolk sac:  Visualized. Embryo:  Visualized. Cardiac Activity: Visualized. Heart Rate: 93  bpm CRL:  2  mm   5 w   5 d                  US EDC: 12/15/2016 Subchorionic hemorrhage:  None visualized. Maternal uterus/adnexae: Subchorionic hemorrhage: None Right ovary: Normal Left ovary: Normal containing corpus luteum Other :None Free fluid:  None IMPRESSION: 1. Single living intrauterine gestation. The estimated gestational age is 5 weeks and 5 days. 2. No findings to explain patient's bleeding. Electronically Signed   By: Signa Kellaylor  Stroud M.D.   On:  04/20/2017 14:50   [place] Ob Transvaginal  Result Date: 04/20/2017 CLINICAL DATA:  Early pregnancy.  Bleeding. EXAM: OBSTETRIC <14 WK US AND TRANSVAGINAL OB US TECHNIQUE: Both transabdominal and transvaginal ultrasound examinations were performed for complete evaluation of the gestation as well as the maternal uterus, adnexal regions, and pelvic cul-de-sac. Transvaginal technique was performed to assess early pregnancy. COMPARISON:  None. FINDINGS: Intrauterine gestational sac: Single Yolk sac:  Visualized. Embryo:  Visualized. Cardiac Activity: Visualized. Heart Rate: 93  bpm CRL:  2  mm   5 w  5 d                  [place] EDC: 12/15/2016 Subchorionic hemorrhage:  None visualized. Maternal uterus/adnexae: Subchorionic hemorrhage: None Right ovary: Normal Left ovary: Normal containing corpus luteum Other :None Free fluid:  None IMPRESSION: 1. Single living intrauterine gestation. The estimated gestational age is 5 weeks and 5 days. 2. No findings to explain patient's bleeding. Electronically Signed   By: Signa Kell M.D.   On: 04/20/2017 14:50   MAU Course  Procedures  GC/CHL culture pending  MDM MSE Labs Exam [place] Rx for home for nausea  Assessment and Plan  A:  Vaginal bleeding in first trimester pregnancy        Positive Pregnancy Test       Viable IUP by U/S at [redacted]w[redacted]d gestation-neg for cause of bleeding on U/S       Nausea in early pregnancy  P:  Discussed lab and U/S findings      Encouraged her to begin prenatal care, start OTC daily prenatal vitamins      Pt requested Rx for nausea--Rx for Phenergan 12.5mg  patient alerted to drowsiness associated with medication      Encouraged her to drink more po fluids.          Dennison Mascot Key 04/20/2017, 3:15 PM

## 2017-04-20 NOTE — Discharge Instructions (Signed)
Morning Sickness Morning sickness is when you feel sick to your stomach (nauseous) during pregnancy. You may feel sick to your stomach and throw up (vomit). You may feel sick in the morning, but you can feel this way any time of day. Some women feel very sick to their stomach and cannot stop throwing up (hyperemesis gravidarum). Follow these instructions at home:  Only take medicines as told by your doctor.  Take multivitamins as told by your doctor. Taking multivitamins before getting pregnant can stop or lessen the harshness of morning sickness.  Eat dry toast or unsalted crackers before getting out of bed.  Eat 5 to 6 small meals a day.  Eat dry and bland foods like rice and baked potatoes.  Do not drink liquids with meals. Drink between meals.  Do not eat greasy, fatty, or spicy foods.  Have someone cook for you if the smell of food causes you to feel sick or throw up.  If you feel sick to your stomach after taking prenatal vitamins, take them at night or with a snack.  Eat protein when you need a snack (nuts, yogurt, cheese).  Eat unsweetened gelatins for dessert.  Wear a bracelet used for sea sickness (acupressure wristband).  Go to a doctor that puts thin needles into certain body points (acupuncture) to improve how you feel.  Do not smoke.  Use a humidifier to keep the air in your house free of odors.  Get lots of fresh air. Contact a doctor if:  You need medicine to feel better.  You feel dizzy or lightheaded.  You are losing weight. Get help right away if:  You feel very sick to your stomach and cannot stop throwing up.  You pass out (faint). This information is not intended to replace advice given to you by your health care provider. Make sure you discuss any questions you have with your health care provider. Document Released: 10/30/2004 Document Revised: 02/28/2016 Document Reviewed: 03/09/2013 Elsevier Interactive Patient Education  2017 Elsevier  Inc. Vaginal Bleeding During Pregnancy, First Trimester A small amount of bleeding (spotting) from the vagina is common in early pregnancy. Sometimes the bleeding is normal and is not a problem, and sometimes it is a sign of something serious. Be sure to tell your doctor about any bleeding from your vagina right away. Follow these instructions at home:  Watch your condition for any changes.  Follow your doctor's instructions about how active you can be.  If you are on bed rest: ? You may need to stay in bed and only get up to use the bathroom. ? You may be allowed to do some activities. ? If you need help, make plans for someone to help you.  Write down: ? The number of pads you use each day. ? How often you change pads. ? How soaked (saturated) your pads are.  Do not use tampons.  Do not douche.  Do not have sex or orgasms until your doctor says it is okay.  If you pass any tissue from your vagina, save the tissue so you can show it to your doctor.  Only take medicines as told by your doctor.  Do not take aspirin because it can make you bleed.  Keep all follow-up visits as told by your doctor. Contact a doctor if:  You bleed from your vagina.  You have cramps.  You have labor pains.  You have a fever that does not go away after you take medicine. Get help right  away if:  You have very bad cramps in your back or belly (abdomen).  You pass large clots or tissue from your vagina.  You bleed more.  You feel light-headed or weak.  You pass out (faint).  You have chills.  You are leaking fluid or have a gush of fluid from your vagina.  You pass out while pooping (having a bowel movement). This information is not intended to replace advice given to you by your health care provider. Make sure you discuss any questions you have with your health care provider. Document Released: 02/06/2014 Document Revised: 02/28/2016 Document Reviewed: 05/30/2013 Elsevier  Interactive Patient Education  [employer].

## 2017-04-20 NOTE — MAU Note (Signed)
Pt states she had a vaginal delivery in March. Pt states she had postpartum bleeding for about a month after the baby was born. Pt states she started having periods again in May. Pt states since May her periods have been lasting 10-15 days. Pt states she started bleeding this month on the 6th and has been bleeding since then. Pt denies clots. Pt states for the last three days she has had a headache and today she feels sick and vomited x1.

## 2017-04-21 LAB — GC/CHLAMYDIA PROBE AMP (~~LOC~~) NOT AT ARMC
Chlamydia: NEGATIVE
NEISSERIA GONORRHEA: NEGATIVE

## 2017-04-21 LAB — HIV ANTIBODY (ROUTINE TESTING W REFLEX): HIV SCREEN 4TH GENERATION: NONREACTIVE

## 2017-05-16 ENCOUNTER — Encounter (HOSPITAL_COMMUNITY): Payer: Self-pay | Admitting: *Deleted

## 2017-05-16 ENCOUNTER — Inpatient Hospital Stay (HOSPITAL_COMMUNITY): Payer: Medicaid Other

## 2017-05-16 ENCOUNTER — Inpatient Hospital Stay (HOSPITAL_COMMUNITY)
Admission: AD | Admit: 2017-05-16 | Discharge: 2017-05-16 | Disposition: A | Discharge status: Home / Self Care | Payer: Medicaid Other | Source: Ambulatory Visit | Attending: Family Medicine | Admitting: Family Medicine

## 2017-05-16 DIAGNOSIS — O209 Hemorrhage in early pregnancy, unspecified: Secondary | ICD-10-CM | POA: Diagnosis not present

## 2017-05-16 DIAGNOSIS — O99281 Endocrine, nutritional and metabolic diseases complicating pregnancy, first trimester: Secondary | ICD-10-CM | POA: Diagnosis not present

## 2017-05-16 DIAGNOSIS — E0590 Thyrotoxicosis, unspecified without thyrotoxic crisis or storm: Secondary | ICD-10-CM | POA: Insufficient documentation

## 2017-05-16 DIAGNOSIS — O039 Complete or unspecified spontaneous abortion without complication: Secondary | ICD-10-CM

## 2017-05-16 DIAGNOSIS — Z3A09 9 weeks gestation of pregnancy: Secondary | ICD-10-CM | POA: Diagnosis not present

## 2017-05-16 LAB — RAPID URINE DRUG SCREEN, HOSP PERFORMED
Amphetamines: NOT DETECTED
Barbiturates: NOT DETECTED
Benzodiazepines: NOT DETECTED
Cocaine: NOT DETECTED
Opiates: NOT DETECTED
Tetrahydrocannabinol: POSITIVE — AB

## 2017-05-16 MED ORDER — MISOPROSTOL 200 MCG PO TABS
800.0000 ug | ORAL_TABLET | Freq: Once | ORAL | Status: AC
  Administered 2017-05-16: 800 ug via ORAL
  Filled 2017-05-16: qty 4

## 2017-05-16 MED ORDER — OXYCODONE-ACETAMINOPHEN 5-325 MG PO TABS: 2.0000 | ORAL_TABLET | ORAL | 0 refills | Status: DC | PRN

## 2017-05-16 NOTE — MAU Provider Note (Signed)
History   G3P2002 @ 9.3 wks in with vag bleeding x 2 days w2ith passing lg clot this morning. Having sm amt bright vag bleeding at present.  CSN: [id number]  Arrival date & time 05/16/17  1536   None     Chief Complaint  Patient presents with  . Vaginal Bleeding    HPI  Past Medical History:  Diagnosis Date  . Hyperthyroidism     Past Surgical History:  Procedure Laterality Date  . CESAREAN SECTION      Family History  Problem Relation Age of Onset  . Diabetes Maternal Grandmother     Social History  Substance Use Topics  . Smoking status: Never Smoker  . Smokeless tobacco: Never Used  . Alcohol use No    OB History    Gravida Para Term Preterm AB Living   4 3 2     3    SAB TAB Ectopic Multiple Live Births           3      Review of Systems  Constitutional: Negative.   HENT: Negative.   Eyes: Negative.   Respiratory: Negative.   Cardiovascular: Negative.   Gastrointestinal: Positive for abdominal pain.  Endocrine: Negative.   Genitourinary: Positive for vaginal bleeding.  Musculoskeletal: Negative.   Skin: Negative.   Allergic/Immunologic: Negative.   Neurological: Negative.   Hematological: Negative.   Psychiatric/Behavioral: Negative.     Allergies  Patient has no known allergies.  Home Medications    BP 117/81 (BP Location: Right Arm)   Pulse 76   Temp 98.7 F (37.1 C) (Oral)   Resp 16   Wt 137 lb 12 oz (62.5 kg)   LMP 04/10/2017 (Exact Date)   SpO2 100%   BMI 22.23 kg/m   Physical Exam  Constitutional: She is oriented to person, place, and time. She appears well-developed and well-nourished.  HENT:  Head: Normocephalic.  Eyes: Pupils are equal, round, and reactive to light.  Neck: Normal range of motion.  Cardiovascular: Normal rate, regular rhythm, normal heart sounds and intact distal pulses.   Pulmonary/Chest: Effort normal and breath sounds normal.  Abdominal: Soft. Bowel sounds are normal.  Genitourinary:  Genitourinary  Comments: sm amt bright vag bleeding  Musculoskeletal: Normal range of motion.  Neurological: She is alert and oriented to person, place, and time. She has normal reflexes.  Skin: Skin is warm and dry.  Psychiatric: She has a normal mood and affect. Her behavior is normal. Judgment and thought content normal.    MAU Course  Procedures (including critical care time)  Labs Reviewed  RAPID URINE DRUG SCREEN, HOSP PERFORMED   No results found.   1. Vaginal bleeding in pregnancy, first trimester       MDM  VSS, Sm amt bright vag bleeding. US shows miscarriage. Dr. Shawnie PonsPratt reviewed us findings. Will give 800 cytotec d/c home with pain management

## 2017-05-16 NOTE — Discharge Instructions (Signed)

## 2017-05-16 NOTE — MAU Note (Signed)
Passed a large clot this morning, continues to bleed.

## 2017-08-23 ENCOUNTER — Encounter (HOSPITAL_COMMUNITY): Payer: Self-pay | Admitting: *Deleted

## 2017-08-23 ENCOUNTER — Other Ambulatory Visit: Payer: Self-pay

## 2017-08-23 ENCOUNTER — Ambulatory Visit (HOSPITAL_COMMUNITY)
Admission: EM | Admit: 2017-08-23 | Discharge: 2017-08-23 | Disposition: A | Discharge status: Home / Self Care | Payer: Medicaid Other | Attending: Family Medicine | Admitting: Family Medicine

## 2017-08-23 DIAGNOSIS — Z3202 Encounter for pregnancy test, result negative: Secondary | ICD-10-CM | POA: Diagnosis not present

## 2017-08-23 DIAGNOSIS — M545 Low back pain: Secondary | ICD-10-CM | POA: Diagnosis not present

## 2017-08-23 DIAGNOSIS — S39012A Strain of muscle, fascia and tendon of lower back, initial encounter: Secondary | ICD-10-CM | POA: Diagnosis not present

## 2017-08-23 LAB — POCT URINALYSIS DIP (DEVICE)
Bilirubin Urine: NEGATIVE
Glucose, UA: NEGATIVE mg/dL
Hgb urine dipstick: NEGATIVE
KETONES UR: NEGATIVE mg/dL
Leukocytes, UA: NEGATIVE
Nitrite: NEGATIVE
PH: 7 (ref 5.0–8.0)
PROTEIN: NEGATIVE mg/dL
Specific Gravity, Urine: 1.02 (ref 1.005–1.030)
UROBILINOGEN UA: 1 mg/dL (ref 0.0–1.0)

## 2017-08-23 LAB — POCT PREGNANCY, URINE: PREG TEST UR: NEGATIVE

## 2017-08-23 MED ORDER — DICLOFENAC SODIUM 75 MG PO TBEC: 75.0000 mg | DELAYED_RELEASE_TABLET | Freq: Two times a day (BID) | ORAL | 0 refills | Status: DC

## 2017-08-23 NOTE — ED Provider Notes (Signed)
[facility]   [id number] 08/23/17 Arrival Time: 1534   SUBJECTIVE:  Nicole Love is a 26 y.o. female who presents to the urgent care with complaint of  low back pain x 1 wk without fevers or radiation.  States she wonders if UTI - had recently changed soap.     Past Medical History:  Diagnosis Date  . Hyperthyroidism    Family History  Problem Relation Age of Onset  . Diabetes Maternal Grandmother    Social History   Socioeconomic History  . Marital status: Single    Spouse name: Not on file  . Number of children: Not on file  . Years of education: Not on file  . Highest education level: Not on file  Social Needs  . Financial resource strain: Not on file  . Food insecurity - worry: Not on file  . Food insecurity - inability: Not on file  . Transportation needs - medical: Not on file  . Transportation needs - non-medical: Not on file  Occupational History  . Not on file  Tobacco Use  . Smoking status: Never Smoker  . Smokeless tobacco: Never Used  Substance and Sexual Activity  . Alcohol use: No  . Drug use: No  . Sexual activity: Yes    Birth control/protection: None  Other Topics Concern  . Not on file  Social History Narrative  . Not on file   No outpatient medications have been marked as taking for the 08/23/17 encounter [facility](Hospital Encounter).   No Known Allergies    ROS: As per HPI, remainder of ROS negative.   OBJECTIVE:   Vitals:   08/23/17 1553  BP: 119/79  Pulse: 66  Resp: 16  Temp: (!) 97.3 F (36.3 C)  TempSrc: Oral  SpO2: 97%     General appearance: alert; no distress Eyes: PERRL; EOMI; conjunctiva normal HENT: normocephalic; atraumatic; external ears normal without trauma; nasal mucosa normal; oral mucosa normal Neck: supple Lungs: clear to auscultation bilaterally Heart: regular rate and rhythm Abdomen: soft, non-tender; bowel sounds normal; no masses or organomegaly; no guarding or rebound tenderness Back: no  CVA tenderness Extremities: no cyanosis or edema; symmetrical with no gross deformities Skin: warm and dry Neurologic: normal gait; grossly normal Psychological: alert and cooperative; normal mood and affect  Patient is holding her baby and moving easily without any sign of discomfort.  No back tenderness.     Labs:  Results for orders placed or performed during the hospital encounter of 08/23/17  POCT urinalysis dip (device)  Result Value Ref Range   Glucose, UA NEGATIVE NEGATIVE mg/dL   Bilirubin Urine NEGATIVE NEGATIVE   Ketones, ur NEGATIVE NEGATIVE mg/dL   Specific Gravity, Urine 1.020 1.005 - 1.030   Hgb urine dipstick NEGATIVE NEGATIVE   pH 7.0 5.0 - 8.0   Protein, ur NEGATIVE NEGATIVE mg/dL   Urobilinogen, UA 1.0 0.0 - 1.0 mg/dL   Nitrite NEGATIVE NEGATIVE   Leukocytes, UA NEGATIVE NEGATIVE    Labs Reviewed  POCT URINALYSIS DIP (DEVICE)  POCT PREGNANCY, URINE    No results found.     ASSESSMENT & PLAN:  1. Strain of lumbar region, initial encounter     Meds ordered this encounter  Medications  . diclofenac (VOLTAREN) 75 MG EC tablet    Sig: Take 1 tablet (75 mg total) 2 (two) times daily by mouth.    Dispense:  14 tablet    Refill:  0    Reviewed expectations re: course of current  medical issues. Questions answered. Outlined signs and symptoms indicating need for more acute intervention. Patient verbalized understanding. After Visit Summary given.    Procedures:      Elvina SidleLauenstein, Idalys Konecny, MD 08/23/17 1609

## 2017-08-23 NOTE — ED Triage Notes (Signed)
Denies injury.  C/O low back pain x 1 wk without fevers or radiation.  States she wonders if UTI - had recently changed soap.

## 2017-08-23 NOTE — Discharge Instructions (Signed)
The urine test is normal.  I suspect this is a muscle strain.  If the pain is not improved in a week, see your doctor or return for further evaluation.

## 2017-09-10 ENCOUNTER — Ambulatory Visit: Payer: Medicaid Other

## 2017-09-17 ENCOUNTER — Encounter: Payer: Self-pay | Admitting: *Deleted

## 2017-09-17 ENCOUNTER — Ambulatory Visit (INDEPENDENT_AMBULATORY_CARE_PROVIDER_SITE_OTHER): Payer: Medicaid Other | Admitting: *Deleted

## 2017-09-17 DIAGNOSIS — Z3201 Encounter for pregnancy test, result positive: Secondary | ICD-10-CM | POA: Diagnosis present

## 2017-09-17 DIAGNOSIS — Z3480 Encounter for supervision of other normal pregnancy, unspecified trimester: Secondary | ICD-10-CM

## 2017-09-17 DIAGNOSIS — Z3200 Encounter for pregnancy test, result unknown: Secondary | ICD-10-CM

## 2017-09-17 LAB — POCT PREGNANCY, URINE: Preg Test, Ur: POSITIVE — AB

## 2017-09-17 MED ORDER — PREPLUS 27-1 MG PO TABS: 1.0000 | ORAL_TABLET | Freq: Every day | ORAL | 9 refills | Status: DC

## 2017-09-17 NOTE — Progress Notes (Signed)
Here for pregnancy test which was positive. States LMP 08/11/17 which makes her be 3731w2d with EDD 05/18/18. Would like to get prenatal care here.

## 2017-09-17 NOTE — Progress Notes (Signed)
Agree with nursing staff's documentation of this patient's clinic encounter.  Ashaunte Standley, MD 09/17/2017 12:02 PM    

## 2017-10-01 ENCOUNTER — Telehealth: Payer: Self-pay | Admitting: General Practice

## 2017-10-01 DIAGNOSIS — O219 Vomiting of pregnancy, unspecified: Secondary | ICD-10-CM

## 2017-10-01 MED ORDER — PROMETHAZINE HCL 12.5 MG PO TABS: 12.5000 mg | ORAL_TABLET | Freq: Four times a day (QID) | ORAL | 1 refills | Status: DC | PRN

## 2017-10-01 NOTE — Telephone Encounter (Signed)
Patient called and left message on nurse line stating she is [redacted] weeks pregnant and hasn't been able to eat anything for days. Patient states she has been so sick and keeps throwing up. Per Samara DeistKathryn, can send in Rx for phenergan. Called and informed patient and discussed BRAT diet and eating small frequent meals instead of 3 large meals. Patient verbalized understanding and states in the past she feels like phenergan may have made her feel funny. Told patient this is a low dose but if she has problems to call us back. Patient verbalized understanding and had no other questions

## 2017-10-02 ENCOUNTER — Other Ambulatory Visit: Payer: Self-pay

## 2017-10-02 ENCOUNTER — Ambulatory Visit (HOSPITAL_COMMUNITY)
Admission: EM | Admit: 2017-10-02 | Discharge: 2017-10-02 | Disposition: A | Discharge status: Home / Self Care | Payer: Medicaid Other | Attending: Internal Medicine | Admitting: Internal Medicine

## 2017-10-02 ENCOUNTER — Encounter (HOSPITAL_COMMUNITY): Payer: Self-pay | Admitting: Emergency Medicine

## 2017-10-02 DIAGNOSIS — O219 Vomiting of pregnancy, unspecified: Secondary | ICD-10-CM | POA: Diagnosis not present

## 2017-10-02 MED ORDER — DOXYLAMINE-PYRIDOXINE 10-10 MG PO TBEC: DELAYED_RELEASE_TABLET | ORAL | 0 refills | Status: DC

## 2017-10-02 NOTE — Discharge Instructions (Signed)
Start Diclegis as directed.  You can try small dose of Phenergan to help with nausea and vomiting.  Best treatment for constipation and pregnancy is to increase fiber intake.  I would encourage small sips of water to slowly keep fluids down.  please follow-up with OB/GYN for further management needed.  If continued to have vomiting with weakness, dizziness, follow-up the emergency department for further evaluation.

## 2017-10-02 NOTE — ED Provider Notes (Signed)
[facility]    CSN: [id number] Arrival date & time: 10/02/17  1911     History   Chief Complaint Chief Complaint  Patient presents with  . Routine Prenatal Visit  . Nausea    HPI Nicole Love is a 26 y.o. female.   26 year old female who was [redacted] weeks pregnant comes in for nausea and vomiting.  States she has not been able to keep food down.  Has been trying toast, ginger supplements without relief.  States she called her OB/GYN, and Phenergan was called in.  Patient states that she has a history of chest pain and discomfort taking Phenergan so she has not had it refilled.  She denies any abdominal pain, vaginal spotting.  She has had constipation, and cannot recall last bowel movement.  Denies URI symptoms such as cough, congestion, sore throat.  Denies fever, chills, night sweats.      Past Medical History:  Diagnosis Date  . Hyperthyroidism     Patient Active Problem List   Diagnosis Date Noted  . Supervision of normal subsequent pregnancy 07/19/2013    Past Surgical History:  Procedure Laterality Date  . CESAREAN SECTION      OB History    Gravida Para Term Preterm AB Living   5 3 2     3    SAB TAB Ectopic Multiple Live Births           3       Home Medications    Prior to Admission medications   Medication Sig Start Date End Date Taking? Authorizing Provider  diclofenac (VOLTAREN) 75 MG EC tablet Take 1 tablet (75 mg total) 2 (two) times daily by mouth. 08/23/17   Elvina SidleLauenstein, Kurt, MD  Doxylamine-Pyridoxine 10-10 MG TBEC Two tablets at bedtime on day 1 and 2; if symptoms persist, take 1 tablet in morning and 2 tablets at bedtime on day 3; if symptoms persist, may increase to 1 tablet in morning, 1 tablet mid-afternoon, and 2 tablets at bedtime on day 4 10/02/17   Belinda FisherYu, Amy V, PA-C  Prenatal Vit-Fe Fumarate-FA (PRENATAL MULTIVITAMIN) TABS tablet Take 1 tablet by mouth daily.    [provider]  Prenatal Vit-Fe Fumarate-FA (PREPLUS) 27-1  MG TABS Take 1 tablet by mouth daily. 09/17/17   Conan Bowensavis, Kelly M, MD  promethazine (PHENERGAN) 12.5 MG tablet Take 1 tablet (12.5 mg total) by mouth every 6 (six) hours as needed for nausea or vomiting. 10/01/17   Marylene LandKooistra, Kathryn Lorraine, CNM    Family History Family History  Problem Relation Age of Onset  . Diabetes Maternal Grandmother     Social History Social History   Tobacco Use  . Smoking status: Never Smoker  . Smokeless tobacco: Never Used  Substance Use Topics  . Alcohol use: No  . Drug use: No     Allergies   Patient has no known allergies.   Review of Systems Review of Systems  Reason unable to perform ROS: See HPI as above.     Physical Exam Triage Vital Signs ED Triage Vitals  Enc Vitals Group     BP 10/02/17 1919 133/81     Pulse Rate 10/02/17 1919 78     Resp 10/02/17 1919 16     Temp 10/02/17 1919 98.9 F (37.2 C)     Temp src --      SpO2 10/02/17 1919 99 %     Weight --      Height --  Head Circumference --      Peak Flow --      Pain Score 10/02/17 1921 8     Pain Loc --      Pain Edu? --      Excl. in GC? --    No data found.  Updated Vital Signs BP 133/81   Pulse 78   Temp 98.9 F (37.2 C)   Resp 16   LMP 08/11/2017   SpO2 99%   Physical Exam  Constitutional: She is oriented to person, place, and time. She appears well-developed and well-nourished. No distress.  HENT:  Head: Normocephalic and atraumatic.  Eyes: Conjunctivae are normal. Pupils are equal, round, and reactive to light.  Cardiovascular: Normal rate, regular rhythm and normal heart sounds. Exam reveals no gallop and no friction rub.  No murmur heard. Pulmonary/Chest: Effort normal and breath sounds normal. She has no wheezes. She has no rales.  Abdominal: Soft. Bowel sounds are normal. She exhibits no mass. There is no tenderness. There is no rebound, no guarding and no CVA tenderness.  Neurological: She is alert and oriented to person, place, and time.    Skin: Skin is warm and dry.  Psychiatric: She has a normal mood and affect. Her behavior is normal. Judgment normal.     UC Treatments / Results  Labs (all labs ordered are listed, but only abnormal results are displayed) Labs Reviewed - No data to display  EKG  EKG Interpretation None       Radiology No results found.  Procedures Procedures (including critical care time)  Medications Ordered in UC Medications - No data to display   Initial Impression / Assessment and Plan / UC Course  I have reviewed the triage vital signs and the nursing notes.  Pertinent labs & imaging results that were available during my care of the patient were reviewed by me and considered in my medical decision making (see chart for details).    Start diclegis directed.  Other symptomatic treatment discussed.  Patient to follow-up with OB/GYN for further evaluation and treatment needed.  Return precautions given.  Patient expresses understanding and agrees to plan.  Final Clinical Impressions(s) / UC Diagnoses   Final diagnoses:  Nausea/vomiting in pregnancy    ED Discharge Orders        Ordered    Doxylamine-Pyridoxine 10-10 MG TBEC     10/02/17 1944        Belinda FisherYu, Amy V, PA-C 10/02/17 1953

## 2017-10-02 NOTE — ED Triage Notes (Signed)
Pt states "I just found out a week ago that im pregnant, i've been very sick, about christmas eve i've been vomiting like crazy, not eating, coughing, chest pain".

## 2017-10-04 ENCOUNTER — Other Ambulatory Visit: Payer: Self-pay

## 2017-10-04 ENCOUNTER — Encounter (HOSPITAL_COMMUNITY): Payer: Self-pay | Admitting: Emergency Medicine

## 2017-10-04 ENCOUNTER — Emergency Department (HOSPITAL_COMMUNITY)
Admission: EM | Admit: 2017-10-04 | Discharge: 2017-10-04 | Disposition: A | Discharge status: Home / Self Care | Payer: Medicaid Other | Attending: Physician Assistant | Admitting: Physician Assistant

## 2017-10-04 DIAGNOSIS — Z79899 Other long term (current) drug therapy: Secondary | ICD-10-CM | POA: Insufficient documentation

## 2017-10-04 DIAGNOSIS — Z3A00 Weeks of gestation of pregnancy not specified: Secondary | ICD-10-CM | POA: Insufficient documentation

## 2017-10-04 DIAGNOSIS — O219 Vomiting of pregnancy, unspecified: Secondary | ICD-10-CM

## 2017-10-04 DIAGNOSIS — O218 Other vomiting complicating pregnancy: Secondary | ICD-10-CM | POA: Diagnosis not present

## 2017-10-04 DIAGNOSIS — R112 Nausea with vomiting, unspecified: Secondary | ICD-10-CM | POA: Diagnosis not present

## 2017-10-04 HISTORY — DX: Encounter for supervision of normal pregnancy, unspecified, unspecified trimester: Z34.90

## 2017-10-04 LAB — COMPREHENSIVE METABOLIC PANEL
ALK PHOS: 50 U/L (ref 38–126)
ALT: 11 U/L — AB (ref 14–54)
AST: 16 U/L (ref 15–41)
Albumin: 4.2 g/dL (ref 3.5–5.0)
Anion gap: 8 (ref 5–15)
BILIRUBIN TOTAL: 0.9 mg/dL (ref 0.3–1.2)
BUN: 8 mg/dL (ref 6–20)
CALCIUM: 9.3 mg/dL (ref 8.9–10.3)
CO2: 22 mmol/L (ref 22–32)
CREATININE: 0.6 mg/dL (ref 0.44–1.00)
Chloride: 104 mmol/L (ref 101–111)
GFR calc Af Amer: >60 mL/min (ref >60)
Glucose, Bld: 85 mg/dL (ref 65–99)
Potassium: 3.5 mmol/L (ref 3.5–5.1)
Sodium: 134 mmol/L — ABNORMAL LOW (ref 135–145)
TOTAL PROTEIN: 7.4 g/dL (ref 6.5–8.1)

## 2017-10-04 LAB — PREGNANCY, URINE: Preg Test, Ur: POSITIVE — AB

## 2017-10-04 LAB — CBC
HEMATOCRIT: 36.6 % (ref 36.0–46.0)
Hemoglobin: 13.1 g/dL (ref 12.0–15.0)
MCH: 31 pg (ref 26.0–34.0)
MCHC: 35.8 g/dL (ref 30.0–36.0)
MCV: 86.7 fL (ref 78.0–100.0)
Platelets: 244 10*3/uL (ref 150–400)
RBC: 4.22 MIL/uL (ref 3.87–5.11)
RDW: 13.3 % (ref 11.5–15.5)
WBC: 12.4 10*3/uL — AB (ref 4.0–10.5)

## 2017-10-04 MED ORDER — DOCUSATE SODIUM 100 MG PO CAPS: 100.0000 mg | ORAL_CAPSULE | Freq: Two times a day (BID) | ORAL | 0 refills | Status: DC

## 2017-10-04 MED ORDER — ONDANSETRON 4 MG PO TBDP: 4.0000 mg | ORAL_TABLET | Freq: Three times a day (TID) | ORAL | 0 refills | Status: DC | PRN

## 2017-10-04 MED ORDER — ONDANSETRON 4 MG PO TBDP
4.0000 mg | ORAL_TABLET | Freq: Once | ORAL | Status: AC
  Administered 2017-10-04: 4 mg via ORAL
  Filled 2017-10-04: qty 1

## 2017-10-04 NOTE — ED Triage Notes (Signed)
Pt. Stated, Nicole Atlasve been sick for a week. I went to UC and they said they can't do anything for me.  Im not able to eat cause of throwing up and its probably due to pregnancy. The medicine I take causes me to have chest pain and throw up more. Now Im weak and dehydrated.

## 2017-10-04 NOTE — ED Notes (Signed)
Pt provided with [place] sandwich bag and apple juice.

## 2017-10-04 NOTE — ED Notes (Signed)
Pt given ice chips

## 2017-10-04 NOTE — Discharge Instructions (Signed)
Follow-up with your OBGYN

## 2017-10-04 NOTE — ED Notes (Signed)
Patient able to ambulate independently  

## 2017-10-04 NOTE — ED Notes (Signed)
Called lab to follow up on CMP, who states they will pull and run it now.

## 2017-10-04 NOTE — ED Provider Notes (Signed)
[facility] Provider Note   CSN: [id number] Arrival date & time: 10/04/17  1437     History   Chief Complaint Chief Complaint  Patient presents with  . Emesis  . Routine Prenatal Visit  . Chest Pain    HPI Nicole Love is a 26 y.o. female.  HPI   Patient is 26 year old female presenting with new pregnancy and nausea.  Patient reports she is  nauseated during this early pregnancy..  She reports that Phenergan and Diclegis that they just do not work for her.  She denies any other symptoms for fatigue.  She does report that she occasionally has chest pain when she gets so nauseated that the nausea travels up into her chest.  Past Medical History:  Diagnosis Date  . Hyperthyroidism   . Pregnancy     Patient Active Problem List   Diagnosis Date Noted  . Supervision of normal subsequent pregnancy 07/19/2013    Past Surgical History:  Procedure Laterality Date  . CESAREAN SECTION      OB History    Gravida Para Term Preterm AB Living   5 3 2     3    SAB TAB Ectopic Multiple Live Births           3       Home Medications    Prior to Admission medications   Medication Sig Start Date End Date Taking? Authorizing Provider  diclofenac (VOLTAREN) 75 MG EC tablet Take 1 tablet (75 mg total) 2 (two) times daily by mouth. 08/23/17   Elvina SidleLauenstein, Kurt, MD  Doxylamine-Pyridoxine 10-10 MG TBEC Two tablets at bedtime on day 1 and 2; if symptoms persist, take 1 tablet in morning and 2 tablets at bedtime on day 3; if symptoms persist, may increase to 1 tablet in morning, 1 tablet mid-afternoon, and 2 tablets at bedtime on day 4 10/02/17   Belinda FisherYu, Amy V, PA-C  Prenatal Vit-Fe Fumarate-FA (PRENATAL MULTIVITAMIN) TABS tablet Take 1 tablet by mouth daily.    [provider]  Prenatal Vit-Fe Fumarate-FA (PREPLUS) 27-1 MG TABS Take 1 tablet by mouth daily. 09/17/17   Conan Bowensavis, Kelly M, MD  promethazine (PHENERGAN) 12.5 MG tablet Take 1 tablet  (12.5 mg total) by mouth every 6 (six) hours as needed for nausea or vomiting. 10/01/17   Marylene LandKooistra, Kathryn Lorraine, CNM    Family History Family History  Problem Relation Age of Onset  . Diabetes Maternal Grandmother     Social History Social History   Tobacco Use  . Smoking status: Never Smoker  . Smokeless tobacco: Never Used  Substance Use Topics  . Alcohol use: No  . Drug use: No     Allergies   Patient has no known allergies.   Review of Systems Review of Systems  Constitutional: Negative for activity change and fever.  HENT: Negative for congestion.   Respiratory: Negative for shortness of breath.   Cardiovascular: Negative for chest pain.  Gastrointestinal: Positive for constipation and nausea. Negative for abdominal pain and diarrhea.  All other systems reviewed and are negative.    Physical Exam Updated Vital Signs BP 119/90 (BP Location: Right Arm)   Pulse 87   Resp 17   Ht 5\' 6"  (1.676 m)   Wt 64.4 kg (142 lb)   LMP 08/11/2017   SpO2 99%   BMI 22.92 kg/m   Physical Exam  Constitutional: She is oriented to person, place, and time. She appears well-developed and well-nourished.  HENT:  Head: Normocephalic and atraumatic.  Eyes: Right eye exhibits no discharge.  Neck: Normal range of motion. Neck supple.  Cardiovascular: Normal rate, regular rhythm and normal pulses.  Pulmonary/Chest: Effort normal and breath sounds normal.  Neurological: She is oriented to person, place, and time.  Skin: Skin is warm and dry. She is not diaphoretic.  Psychiatric: She has a normal mood and affect.  Nursing note and vitals reviewed.    ED Treatments / Results  Labs (all labs ordered are listed, but only abnormal results are displayed) Labs Reviewed  CBC - Abnormal; Notable for the following components:      Result Value   WBC 12.4 (*)    All other components within normal limits  COMPREHENSIVE METABOLIC PANEL - Abnormal; Notable for the following  components:   Sodium 134 (*)    ALT 11 (*)    All other components within normal limits  PREGNANCY, URINE    EKG  EKG Interpretation  Date/Time:  Sunday October 04 2017 14:45:28 EST Ventricular Rate:  77 PR Interval:  132 QRS Duration: 78 QT Interval:  348 QTC Calculation: 393 R Axis:   73 Text Interpretation:  Normal sinus rhythm Normal ECG Normal sinus rhythm Confirmed by Corlis LeakMackuen, Daysia Vandenboom ([id number]) on 10/04/2017 6:18:59 PM       Radiology No results found.  Procedures Procedures (including critical care time)  Medications Ordered in ED Medications  ondansetron (ZOFRAN-ODT) disintegrating tablet 4 mg (4 mg Oral Given 10/04/17 1853)     Initial Impression / Assessment and Plan / ED Course  I have reviewed the triage vital signs and the nursing notes.  Pertinent labs & imaging results that were available during my care of the patient were reviewed by me and considered in my medical decision making (see chart for details).     Patient is 26 year old female presenting with new pregnancy and nausea.  Patient reports she is  nauseated during this early pregnancy..  She reports that Phenergan and Diclegis that they just do not work for her.  She denies any other symptoms for fatigue.  She does report that she occasionally has chest pain when she gets so nauseated that the nausea travels up into her chest.  7:13 PM Long discussion had about Zofran and the risks.  Patient would like to take medication despite the risks.  I think this is a reasonable decision.  Will prescribe short course of Zofran.  We will offer  her Colace to help with constipation.  We will have her follow-up with her primary OB/GYN.  No abdominal pain no bleeding.  Final Clinical Impressions(s) / ED Diagnoses   Final diagnoses:  None    ED Discharge Orders    None       Abelino DerrickMackuen, Shalaine Payson Lyn, MD 10/04/17 1914

## 2017-10-06 NOTE — L&D Delivery Note (Signed)
Patient: Nicole RhineHannah L Love MRN: [id number]  GBS status: Neg  Patient is a 27 y.o. now G5P4 s/p NSVD at 3423w2d, who was admitted for SROM. SROM 31h 38106m prior to delivery with clear fluid. Labor augmented with Pitocin.    Delivery Note At 3:40 PM a viable female was delivered via VBAC, Spontaneous (Presentation: LOA ).  APGAR:9 , 9; weight 7lb 3.7 oz .   Placenta status: spontaneous ,intact .  Cord: 3 vessel  with the following complications: none .    Anesthesia:  Epidural  Episiotomy:  None  Lacerations:  Left Periurethral  Suture Repair: hemostatic  Est. Blood Loss (mL):  200    Head delivered LOA. No nuchal cord present. Shoulder and body delivered in usual fashion. Infant with spontaneous cry, placed on mother's abdomen, dried and bulb suctioned. Cord clamped x 2 after 1-minute delay, and cut by family member. Cord blood drawn. Placenta delivered spontaneously with gentle cord traction. Fundus firm with massage and Pitocin. Perineum inspected and found to have left periurethral laceration, which was found to be hemostatic.  Mom to postpartum.  Baby to Couplet care / Skin to Skin.  De HollingsheadCatherine L Eirik Schueler 05/17/2018, 4:02 PM

## 2017-10-13 ENCOUNTER — Telehealth: Payer: Self-pay

## 2017-10-13 NOTE — Telephone Encounter (Signed)
Pt.called& left  message on nurse line reqarding having same symptoms she had before during previous miscarriage minus bleeding, some horrible cramping. Wants to know if we can schedule her appt. Sooner than 10/22/17. Called pt.no answer ,left message on VM to come to [facility] if needed.

## 2017-10-15 ENCOUNTER — Telehealth: Payer: Self-pay

## 2017-10-15 ENCOUNTER — Telehealth: Payer: Self-pay | Admitting: Obstetrics & Gynecology

## 2017-10-15 DIAGNOSIS — R112 Nausea with vomiting, unspecified: Secondary | ICD-10-CM

## 2017-10-15 MED ORDER — DOXYLAMINE-PYRIDOXINE 10-10 MG PO TBEC: DELAYED_RELEASE_TABLET | ORAL | 0 refills | Status: DC

## 2017-10-15 MED ORDER — DOXYLAMINE-PYRIDOXINE 10-10 MG PO TBEC: 10.0000 mg | DELAYED_RELEASE_TABLET | Freq: Four times a day (QID) | ORAL | 0 refills | Status: DC

## 2017-10-15 MED ORDER — DOXYLAMINE-PYRIDOXINE 10-10 MG PO TBEC: 10.0000 mg | DELAYED_RELEASE_TABLET | Freq: Four times a day (QID) | ORAL | 5 refills | Status: AC

## 2017-10-15 NOTE — Telephone Encounter (Signed)
Patient called to say she has not been able to keep anything down, not even water. Spoke with [place] CMA, and she will speak with a provider about getting a Rx. Wants it to go to [facility] on [address].

## 2017-10-15 NOTE — Telephone Encounter (Signed)
Patient called the office stating that she is having severe nausea and vomiting. Pt states that Zofan is the only medication that works for her.Advised patient to keep new OB appt and discuss with provider if syptoms persist. Patient verbalized understanding and had no questions.

## 2017-10-22 ENCOUNTER — Other Ambulatory Visit (HOSPITAL_COMMUNITY)
Admission: RE | Admit: 2017-10-22 | Discharge: 2017-10-22 | Disposition: A | Discharge status: Home / Self Care | Payer: Medicaid Other | Source: Ambulatory Visit | Attending: Student | Admitting: Student

## 2017-10-22 ENCOUNTER — Encounter: Payer: Self-pay | Admitting: Student

## 2017-10-22 ENCOUNTER — Ambulatory Visit (INDEPENDENT_AMBULATORY_CARE_PROVIDER_SITE_OTHER): Payer: Medicaid Other | Admitting: Student

## 2017-10-22 ENCOUNTER — Ambulatory Visit: Payer: Self-pay

## 2017-10-22 VITALS — BP 120/77 | HR 89 | Wt 136.8 lb

## 2017-10-22 DIAGNOSIS — E0590 Thyrotoxicosis, unspecified without thyrotoxic crisis or storm: Secondary | ICD-10-CM | POA: Diagnosis not present

## 2017-10-22 DIAGNOSIS — Z3480 Encounter for supervision of other normal pregnancy, unspecified trimester: Secondary | ICD-10-CM | POA: Insufficient documentation

## 2017-10-22 DIAGNOSIS — Z3A00 Weeks of gestation of pregnancy not specified: Secondary | ICD-10-CM | POA: Insufficient documentation

## 2017-10-22 DIAGNOSIS — O3680X0 Pregnancy with inconclusive fetal viability, not applicable or unspecified: Secondary | ICD-10-CM

## 2017-10-22 DIAGNOSIS — Z3481 Encounter for supervision of other normal pregnancy, first trimester: Secondary | ICD-10-CM

## 2017-10-22 DIAGNOSIS — O0993 Supervision of high risk pregnancy, unspecified, third trimester: Secondary | ICD-10-CM | POA: Insufficient documentation

## 2017-10-22 LAB — POCT URINALYSIS DIP (DEVICE)
Glucose, UA: NEGATIVE mg/dL
Hgb urine dipstick: NEGATIVE
KETONES UR: 40 mg/dL — AB
Leukocytes, UA: NEGATIVE
Nitrite: NEGATIVE
PROTEIN: 30 mg/dL — AB
Specific Gravity, Urine: 1.025 (ref 1.005–1.030)
Urobilinogen, UA: 0.2 mg/dL (ref 0.0–1.0)
pH: 6.5 (ref 5.0–8.0)

## 2017-10-22 MED ORDER — ONDANSETRON HCL 8 MG PO TABS: 8.0000 mg | ORAL_TABLET | Freq: Every day | ORAL | 1 refills | Status: DC

## 2017-10-22 NOTE — Patient Instructions (Signed)
Morning Sickness Morning sickness is when you feel sick to your stomach (nauseous) during pregnancy. You may feel sick to your stomach and throw up (vomit). You may feel sick in the morning, but you can feel this way any time of day. Some women feel very sick to their stomach and cannot stop throwing up (hyperemesis gravidarum). Follow these instructions at home:  Only take medicines as told by your doctor.  Take multivitamins as told by your doctor. Taking multivitamins before getting pregnant can stop or lessen the harshness of morning sickness.  Eat dry toast or unsalted crackers before getting out of bed.  Eat 5 to 6 small meals a day.  Eat dry and bland foods like rice and baked potatoes.  Do not drink liquids with meals. Drink between meals.  Do not eat greasy, fatty, or spicy foods.  Have someone cook for you if the smell of food causes you to feel sick or throw up.  If you feel sick to your stomach after taking prenatal vitamins, take them at night or with a snack.  Eat protein when you need a snack (nuts, yogurt, cheese).  Eat unsweetened gelatins for dessert.  Wear a bracelet used for sea sickness (acupressure wristband).  Go to a doctor that puts thin needles into certain body points (acupuncture) to improve how you feel.  Do not smoke.  Use a humidifier to keep the air in your house free of odors.  Get lots of fresh air. Contact a doctor if:  You need medicine to feel better.  You feel dizzy or lightheaded.  You are losing weight. Get help right away if:  You feel very sick to your stomach and cannot stop throwing up.  You pass out (faint). This information is not intended to replace advice given to you by your health care provider. Make sure you discuss any questions you have with your health care provider. Document Released: 10/30/2004 Document Revised: 02/28/2016 Document Reviewed: 03/09/2013 Elsevier Interactive Patient Education  2017 Elsevier  Inc. Hyperthyroidism Hyperthyroidism is when the thyroid is too active (overactive). Your thyroid is a large gland that is located in your neck. The thyroid helps to control how your body uses food (metabolism). When your thyroid is overactive, it produces too much of a hormone called thyroxine. What are the causes? Causes of hyperthyroidism may include:  Graves disease. This is when your immune system attacks the thyroid gland. This is the most common cause.  Inflammation of the thyroid gland.  Tumor in the thyroid gland or somewhere else.  Excessive use of thyroid medicines, including: ? Prescription thyroid supplement. ? Herbal supplements that mimic thyroid hormones.  Solid or fluid-filled lumps within your thyroid gland (thyroid nodules).  Excessive ingestion of iodine.  What increases the risk?  Being female.  Having a family history of thyroid conditions. What are the signs or symptoms? Signs and symptoms of hyperthyroidism may include:  Nervousness.  Inability to tolerate heat.  Unexplained weight loss.  Diarrhea.  Change in the texture of hair or skin.  Heart skipping beats or making extra beats.  Rapid heart rate.  Loss of menstruation.  Shaky hands.  Fatigue.  Restlessness.  Increased appetite.  Sleep problems.  Enlarged thyroid gland or nodules.  How is this diagnosed? Diagnosis of hyperthyroidism may include:  Medical history and physical exam.  Blood tests.  Ultrasound tests.  How is this treated? Treatment may include:  Medicines to control your thyroid.  Surgery to remove your thyroid.  Radiation  therapy.  Follow these instructions at home:  Take medicines only as directed by your health care provider.  Do not use any tobacco products, including cigarettes, chewing tobacco, or electronic cigarettes. If you need help quitting, ask your health care provider.  Do not exercise or do physical activity until your health care  provider approves.  Keep all follow-up appointments as directed by your health care provider. This is important. Contact a health care provider if:  Your symptoms do not get better with treatment.  You have fever.  You are taking thyroid replacement medicine and you: ? Have depression. ? Feel mentally and physically slow. ? Have weight gain. Get help right away if:  You have decreased alertness or a change in your awareness.  You have abdominal pain.  You feel dizzy.  You have a rapid heartbeat.  You have an irregular heartbeat. This information is not intended to replace advice given to you by your health care provider. Make sure you discuss any questions you have with your health care provider. Document Released: 09/22/2005 Document Revised: 02/21/2016 Document Reviewed: 02/07/2014 Elsevier Interactive Patient Education  2018 [employer]

## 2017-10-22 NOTE — Progress Notes (Signed)
Pt informed that the ultrasound is considered a limited OB ultrasound and is not intended to be a complete ultrasound exam.  Patient also informed that the ultrasound is not being completed with the intent of assessing for fetal or placental anomalies or any pelvic abnormalities.  Explained that the purpose of today's ultrasound is to assess for  viability.  Patient acknowledges the purpose of the exam and the limitations of the study.    

## 2017-10-23 LAB — GC/CHLAMYDIA PROBE AMP (~~LOC~~) NOT AT ARMC
CHLAMYDIA, DNA PROBE: NEGATIVE
NEISSERIA GONORRHEA: NEGATIVE

## 2017-10-23 NOTE — Progress Notes (Signed)
  Subjective:    Nicole RhineHannah L Love is being seen today for her first obstetrical visit.  This is not a planned pregnancy. She is at 2664w6d gestation. Her obstetrical history is significant for hyperthyroidism. Relationship with FOB: significant other, living together. Patient does intend to breast feed. Pregnancy history fully reviewed.  Patient reports no complaints.  Review of Systems:   Review of Systems  Constitutional: Negative.   HENT: Negative.   Respiratory: Negative.   Cardiovascular: Negative.   Genitourinary: Negative.   Neurological: Negative.     Objective:     BP 120/77   Pulse 89   Wt 136 lb 12.8 oz (62.1 kg)   LMP 08/08/2017 (Exact Date)   BMI 22.08 kg/m  Physical Exam  Constitutional: She is oriented to person, place, and time. She appears well-developed and well-nourished.  HENT:  Head: Normocephalic.  Neck: Normal range of motion.  Respiratory: Effort normal and breath sounds normal.  GI: Soft.  Musculoskeletal: Normal range of motion.  Neurological: She is alert and oriented to person, place, and time.  Skin: Skin is warm and dry.    Exam    Assessment:    Pregnancy: G5P2003 Patient Active Problem List   Diagnosis Date Noted  . Supervision of other normal pregnancy, antepartum 10/22/2017  . Hyperthyroidism 10/22/2017       Plan:     Initial labs drawn. Prenatal vitamins. Problem list reviewed and updated. AFP3 discussed: will do NIPS today and AFP later. . Role of ultrasound in pregnancy discussed; fetal survey: will order at next visit. Amniocentesis discussed: not indicated. Follow up in 8 weeks. 50% of 30 min visit spent on counseling and coordination of care.  -oriented patient to practice; explained role of students.  -patient signed release of information to get records From previous practice  Charlesetta GaribaldiKathryn Lorraine [facility] CNM 10/23/2017

## 2017-10-25 LAB — URINE CULTURE, OB REFLEX

## 2017-10-25 LAB — CULTURE, OB URINE

## 2017-10-26 ENCOUNTER — Other Ambulatory Visit: Payer: Self-pay | Admitting: Student

## 2017-10-26 ENCOUNTER — Telehealth: Payer: Self-pay | Admitting: Student

## 2017-10-26 DIAGNOSIS — Z3480 Encounter for supervision of other normal pregnancy, unspecified trimester: Secondary | ICD-10-CM

## 2017-10-26 DIAGNOSIS — N3000 Acute cystitis without hematuria: Secondary | ICD-10-CM

## 2017-10-26 DIAGNOSIS — N390 Urinary tract infection, site not specified: Secondary | ICD-10-CM | POA: Insufficient documentation

## 2017-10-26 MED ORDER — CEPHALEXIN 500 MG PO CAPS: 500.0000 mg | ORAL_CAPSULE | Freq: Four times a day (QID) | ORAL | 0 refills | Status: DC

## 2017-10-26 NOTE — Telephone Encounter (Signed)
Patient notified; patient will pick up RX.

## 2017-10-27 ENCOUNTER — Encounter: Payer: Self-pay | Admitting: *Deleted

## 2017-10-29 ENCOUNTER — Encounter: Payer: Self-pay | Admitting: Student

## 2017-10-30 ENCOUNTER — Other Ambulatory Visit: Payer: Self-pay | Admitting: General Practice

## 2017-10-30 DIAGNOSIS — B379 Candidiasis, unspecified: Secondary | ICD-10-CM

## 2017-10-30 DIAGNOSIS — T3695XA Adverse effect of unspecified systemic antibiotic, initial encounter: Principal | ICD-10-CM

## 2017-10-30 LAB — SMN1 COPY NUMBER ANALYSIS (SMA CARRIER SCREENING)

## 2017-10-30 MED ORDER — MONISTAT 7 COMPLETE THERAPY 100-2 MG-% VA KIT: 1.0000 | PACK | Freq: Every day | VAGINAL | 0 refills | Status: AC

## 2017-11-02 ENCOUNTER — Telehealth: Payer: Self-pay | Admitting: General Practice

## 2017-11-02 DIAGNOSIS — B379 Candidiasis, unspecified: Secondary | ICD-10-CM

## 2017-11-02 LAB — OBSTETRIC PANEL, INCLUDING HIV
Antibody Screen: NEGATIVE
BASOS: 0 %
Basophils Absolute: 0 10*3/uL (ref 0.0–0.2)
EOS (ABSOLUTE): 0.1 10*3/uL (ref 0.0–0.4)
Eos: 1 %
HEMATOCRIT: 40.2 % (ref 34.0–46.6)
HIV Screen 4th Generation wRfx: NONREACTIVE
Hemoglobin: 13.1 g/dL (ref 11.1–15.9)
Hepatitis B Surface Ag: NEGATIVE
IMMATURE GRANS (ABS): 0 10*3/uL (ref 0.0–0.1)
IMMATURE GRANULOCYTES: 0 %
LYMPHS: 19 %
Lymphocytes Absolute: 2.1 10*3/uL (ref 0.7–3.1)
MCH: 30.4 pg (ref 26.6–33.0)
MCHC: 32.6 g/dL (ref 31.5–35.7)
MCV: 93 fL (ref 79–97)
MONOS ABS: 0.6 10*3/uL (ref 0.1–0.9)
Monocytes: 5 %
NEUTROS PCT: 75 %
Neutrophils Absolute: 8.6 10*3/uL — ABNORMAL HIGH (ref 1.4–7.0)
Platelets: 244 10*3/uL (ref 150–379)
RBC: 4.31 x10E6/uL (ref 3.77–5.28)
RDW: 14.4 % (ref 12.3–15.4)
RPR Ser Ql: NONREACTIVE
RUBELLA: 5.26 {index} (ref >0.99)
Rh Factor: POSITIVE
WBC: 11.5 10*3/uL — ABNORMAL HIGH (ref 3.4–10.8)

## 2017-11-02 LAB — HEMOGLOBINOPATHY EVALUATION
HEMOGLOBIN A2 QUANTITATION: 2.3 % (ref 1.8–3.2)
HEMOGLOBIN F QUANTITATION: 0 % (ref 0.0–2.0)
HGB C: 0 %
HGB S: 0 %
HGB VARIANT: 0 %
Hgb A: 97.7 % (ref 96.4–98.8)

## 2017-11-02 LAB — T4, FREE: FREE T4: 1.91 ng/dL — AB (ref 0.82–1.77)

## 2017-11-02 LAB — CYSTIC FIBROSIS GENE TEST

## 2017-11-02 LAB — TSH: TSH: 0.024 u[IU]/mL — AB (ref 0.450–4.500)

## 2017-11-02 LAB — T3: T3 TOTAL: 203 ng/dL — AB (ref 71–180)

## 2017-11-02 MED ORDER — TERCONAZOLE 0.8 % VA CREA: 1.0000 | TOPICAL_CREAM | Freq: Every day | VAGINAL | 0 refills | Status: DC

## 2017-11-02 NOTE — Telephone Encounter (Signed)
Patient called and left message on nurse line requesting terazol for yeast infection because OTC monistat isn't helping. Med called in per protocol. Called and informed patient. Patient verbalized understanding and had no questions

## 2017-11-04 ENCOUNTER — Other Ambulatory Visit: Payer: Self-pay | Admitting: Student

## 2017-11-04 ENCOUNTER — Telehealth: Payer: Self-pay

## 2017-11-04 NOTE — Telephone Encounter (Signed)
Called [employer] and provided info

## 2017-11-04 NOTE — Telephone Encounter (Signed)
A representative from [employer] called to confirm the ordering location for this pt's Panor[facility]. Please call [phone]. Case ID [id number].

## 2017-11-05 ENCOUNTER — Encounter: Payer: Self-pay | Admitting: *Deleted

## 2017-11-05 ENCOUNTER — Encounter: Payer: Self-pay | Admitting: Student

## 2017-11-05 ENCOUNTER — Telehealth: Payer: Self-pay | Admitting: Student

## 2017-11-05 ENCOUNTER — Encounter: Payer: Medicaid Other | Admitting: Student

## 2017-11-05 ENCOUNTER — Other Ambulatory Visit: Payer: Self-pay

## 2017-11-05 DIAGNOSIS — E0590 Thyrotoxicosis, unspecified without thyrotoxic crisis or storm: Secondary | ICD-10-CM

## 2017-11-05 NOTE — Telephone Encounter (Signed)
Called patient and left message saying that we had set up appt for her at [facility] for hyper[facility]roidism on Feb 7 at 3 pm. [facility] on her hyperthyroidism. Suggested patient send me a MyChart message if she needs clarification, or she can call the clinic back.  Nicole KitchensKathryn Kooistra

## 2017-11-12 ENCOUNTER — Ambulatory Visit (HOSPITAL_COMMUNITY)
Admission: RE | Admit: 2017-11-12 | Discharge: 2017-11-12 | Disposition: A | Discharge status: Home / Self Care | Payer: Medicaid Other | Source: Ambulatory Visit | Attending: Student | Admitting: Student

## 2017-11-12 ENCOUNTER — Encounter (HOSPITAL_COMMUNITY): Payer: Self-pay

## 2017-11-12 DIAGNOSIS — R946 Abnormal results of thyroid function studies: Secondary | ICD-10-CM | POA: Diagnosis not present

## 2017-11-12 DIAGNOSIS — O09891 Supervision of other high risk pregnancies, first trimester: Secondary | ICD-10-CM | POA: Diagnosis not present

## 2017-11-12 DIAGNOSIS — Z3A13 13 weeks gestation of pregnancy: Secondary | ICD-10-CM | POA: Insufficient documentation

## 2017-11-12 DIAGNOSIS — E0590 Thyrotoxicosis, unspecified without thyrotoxic crisis or storm: Secondary | ICD-10-CM | POA: Diagnosis not present

## 2017-11-12 DIAGNOSIS — Z7189 Other specified counseling: Secondary | ICD-10-CM | POA: Diagnosis not present

## 2017-11-12 DIAGNOSIS — O99281 Endocrine, nutritional and metabolic diseases complicating pregnancy, first trimester: Secondary | ICD-10-CM | POA: Diagnosis not present

## 2017-11-12 DIAGNOSIS — Z3480 Encounter for supervision of other normal pregnancy, unspecified trimester: Secondary | ICD-10-CM

## 2017-11-12 NOTE — Consult Note (Signed)
MFM consult  27 yr old [id number] at 628w5d with possible hyperthyroidism referred by Dr. Crisoforo OxfordKooistra for consult.  Past OB hx: 1 full term C section; 2 full term VBAC- no complications In last pregnancy in 2017 patient was diagnosed with hyperthyroidism- was not on any medication- just had her hormone levels followed in pregnancy- also had severe hyperemesis and weight loss  PMH: hyperthyroidism- see above; patient is not aware if she has had any antibody testing; has never taken any medication and has not been evaluated since her last pregnancy PSH: C section Medications: zofran, PNV Allergies: NKDA Social hx: negative  I counseled the patient as follows: 1. Hyperthyroidism: - I reviewed the patient's labs from 10/21/17 which showed a decreased TSH and elevated free T4 and total T3 - discussed that this may be consistent with hyperthyroidism given the elevated T4 and T3; however discussed that in the first trimester of pregnancy it can be common to have a suppressed TSH and that the free T4 (of which assays are very variable) and total T3 are not >1.5 the upper limits of normal for nonpregnant range and no specific lab data is given on trimester specific ranges and therefore this may be just subclinical hyperthyroidism which does not carry the increased risk of adverse fetal/neonatal/maternal outcomes - patient has had nausea/vomiting with this pregnancy but not as severe as last pregnancy; no weight loss - discussed that most common cause of hyperthyroidism is Grave's disease- recommend we check thyroid stimulating antibodies today to aid in a diagnosis- drawn today - discussed if patient is diagnosed with hyperthyroidism there is an increased risk of fetal loss, preterm labor, fetal growth restriction, preeclampsia, stillbirth, and heart failure; also discussed risk of thyroid storm - if has thyroid stimulating antibodies discussed risk of fetal or neonatal hyperthyroidism- discussed rarely fetal  tachycardia and goiter can be seen and neonatal affects are transient due to maternal antibodies- theses risks are correlated with the level of maternal antibodies  - patient currently is asymptomatic- does not have palpitations, heat intolerance, or weight loss- did experience these with her last pregnancy  - Recommend following TFTs every 4 weeks - The goal of treatment is to maintain persistent but mild hyperthyroidism in the mother in an attempt to prevent fetal hypothyroidism since the fetal thyroid is more sensitive to the action of antithyroid drugs. Overtreatment of maternal hyperthyroidism with thionamide antithyroid drugs (ATDs) can cause fetal goiter and primary hypothyroidism. On the other hand, transient central hypothyroidism may be seen in infants whose mothers had poorly controlled hyperthyroidism during pregnancy, presumably due to suppression of the fetal pituitary-thyroid axis. To attain the goal of mild hyperthyroidism, the mother's serum free thyroxine (T4) concentration should be maintained at or just above the trimester-specific normal range for pregnancy or (especially if the trimester-specific reference range is not available) the total T4 and triiodothyronine (T3) should be maintained at 1.5 times above the nonpregnant reference range. The serum thyroid-stimulating hormone (TSH) concentration should be below the reference range for pregnancy (eg, goal TSH approximately 0.1 to 0.3 mU/L), using the lowest possible dose of medication   Women with symptomatic, moderate to severe, overt hyperthyroidism due to Graves' disease, toxic adenoma, toxic multinodular goiter, or gestational trophoblastic disease require treatment of hyperthyroidism. Such patients will almost always have TSH values below 0.05 mU/L and elevations in trimester-specific free T4 concentrations and/or total T4 and T3 concentrations that exceed 1.5 times the upper limit of normal for nonpregnant patients  If medical  management is  indicated methimazole is the recommended drug of choice (except in the first trimester due to risk of congenital anomalies I.e aplasia cutis). If medication is needed in the first trimester PTU is preferred and it is recommended to switch to methimazole in the second trimester as the risk of liver failure is higher with PTU. As the patient is just about out of the first trimester and does not require medical management at this time (does not meet criteria above) if she should need medical management in the future would recommend starting methimazole.  Symptom control can be achieved with beta blockers.  If the diagnosis of hyperthyroidism is confirmed I recommend: - fetal growth every 4 weeks starting at 24 weeks - antenatal testing starting at 32 weeks  Recommend follow TFTs every 4 weeks. Patient has been referred to Endocrinology.  I spent a total of 45 minutes with the patient of which >50% was in face to face consultation. Please call with questions.  Eulis Foster, MD

## 2017-11-13 LAB — THYROID STIMULATING IMMUNOGLOBULIN

## 2017-11-19 ENCOUNTER — Encounter (HOSPITAL_COMMUNITY): Payer: Self-pay | Admitting: Obstetrics and Gynecology

## 2017-11-19 ENCOUNTER — Ambulatory Visit (INDEPENDENT_AMBULATORY_CARE_PROVIDER_SITE_OTHER): Payer: Medicaid Other | Admitting: Student

## 2017-11-19 VITALS — BP 123/80 | HR 80 | Wt 139.0 lb

## 2017-11-19 DIAGNOSIS — E0590 Thyrotoxicosis, unspecified without thyrotoxic crisis or storm: Secondary | ICD-10-CM

## 2017-11-19 DIAGNOSIS — Z3480 Encounter for supervision of other normal pregnancy, unspecified trimester: Secondary | ICD-10-CM

## 2017-11-19 NOTE — Patient Instructions (Signed)

## 2017-11-20 ENCOUNTER — Other Ambulatory Visit: Payer: Self-pay | Admitting: General Practice

## 2017-11-20 DIAGNOSIS — O99282 Endocrine, nutritional and metabolic diseases complicating pregnancy, second trimester: Principal | ICD-10-CM

## 2017-11-20 DIAGNOSIS — E0590 Thyrotoxicosis, unspecified without thyrotoxic crisis or storm: Secondary | ICD-10-CM

## 2017-11-20 LAB — TSH+FREE T4
FREE T4: 1.62 ng/dL (ref 0.82–1.77)
TSH: 0.276 u[IU]/mL — ABNORMAL LOW (ref 0.450–4.500)

## 2017-11-20 LAB — T3: T3, Total: 183 ng/dL — ABNORMAL HIGH (ref 71–180)

## 2017-11-20 NOTE — Progress Notes (Signed)
   PRENATAL VISIT NOTE  Subjective:  Nicole Love is a 27 y.o. G5P2013 at 2256w6d being seen today for ongoing prenatal care.  She is currently monitored for the following issues for this high-risk pregnancy and has Supervision of other normal pregnancy, antepartum; Hyperthyroidism; and Urinary tract infection on their problem list.  Patient reports no complaints. Patient has completed her vaginal cream and Keflex. No complaints today.  Contractions: Not present. Vag. Bleeding: None.  Movement: Present. Denies leaking of fluid.   The following portions of the patient's history were reviewed and updated as appropriate: allergies, current medications, past family history, past medical history, past social history, past surgical history and problem list. Problem list updated.  Objective:   Vitals:   11/19/17 1531  BP: 123/80  Pulse: 80  Weight: 139 lb (63 kg)    Fetal Status: Fetal Heart Rate (bpm): 148   Movement: Present     General:  Alert, oriented and cooperative. Patient is in no acute distress.  Skin: Skin is warm and dry. No rash noted.   Cardiovascular: Normal heart rate noted  Respiratory: Normal respiratory effort, no problems with respiration noted  Abdomen: Soft, gravid, appropriate for gestational age.  Pain/Pressure: Absent     Pelvic: Cervical exam deferred        Extremities: Normal range of motion.  Edema: None  Mental Status:  Normal mood and affect. Normal behavior. Normal judgment and thought content.   Assessment and Plan:  Pregnancy: G5P2013 at 6756w6d  1. Supervision of other normal pregnancy, antepartum -Still waiting on records from previous practice - US MFM OB COMP + 14 WK; Future  2. Hyperthyroidism - TSH + free T4 - T3 -No Thyroid stimulating antibodies on lab draw from 2-7; note from MFM reviewed.  -Patient to see MD at next appointment; depending on stability of TFT's patient may come back to APP schedule.   Preterm labor symptoms and general  obstetric precautions including but not limited to vaginal bleeding, contractions, leaking of fluid and fetal movement were reviewed in detail with the patient. Please refer to After Visit Summary for other counseling recommendations.  Return in about 4 weeks (around 12/17/2017), or needs appt with MD, then can come back to midwife schedule .   Marylene LandKathryn Lorraine Datra Clary, CNM

## 2017-11-22 NOTE — Progress Notes (Addendum)
Received notification from [facility] that they do not have opening until October. Contacted [facility] and they can see patient in May. Will send message to [facility] Clinical pool to [facility] and press for earlier appointment, as well as contact [facility] to set up Endo appointment there. Patient made aware.

## 2017-11-24 ENCOUNTER — Encounter: Payer: Self-pay | Admitting: Student

## 2017-11-26 ENCOUNTER — Other Ambulatory Visit: Payer: Self-pay | Admitting: Family Medicine

## 2017-11-26 ENCOUNTER — Other Ambulatory Visit: Payer: Self-pay | Admitting: Student

## 2017-11-26 DIAGNOSIS — B379 Candidiasis, unspecified: Secondary | ICD-10-CM

## 2017-11-26 MED ORDER — TERCONAZOLE 0.8 % VA CREA: 1.0000 | TOPICAL_CREAM | Freq: Every day | VAGINAL | 0 refills | Status: DC

## 2017-11-26 MED ORDER — ONDANSETRON 8 MG PO TBDP: 8.0000 mg | ORAL_TABLET | Freq: Three times a day (TID) | ORAL | 2 refills | Status: DC | PRN

## 2017-11-30 ENCOUNTER — Encounter: Payer: Self-pay | Admitting: General Practice

## 2017-11-30 NOTE — Progress Notes (Signed)
Called [facility] for [facility]. They have received the patient's information and will contact her & us once they have scheduled an appt for [place] at [place]. Will send mychart message

## 2017-12-16 ENCOUNTER — Ambulatory Visit (INDEPENDENT_AMBULATORY_CARE_PROVIDER_SITE_OTHER): Payer: Medicaid Other | Admitting: Obstetrics and Gynecology

## 2017-12-16 ENCOUNTER — Encounter: Payer: Self-pay | Admitting: Obstetrics and Gynecology

## 2017-12-16 VITALS — BP 97/55 | HR 72 | Wt 150.2 lb

## 2017-12-16 DIAGNOSIS — O34219 Maternal care for unspecified type scar from previous cesarean delivery: Secondary | ICD-10-CM

## 2017-12-16 DIAGNOSIS — Z3480 Encounter for supervision of other normal pregnancy, unspecified trimester: Secondary | ICD-10-CM

## 2017-12-16 DIAGNOSIS — E0590 Thyrotoxicosis, unspecified without thyrotoxic crisis or storm: Secondary | ICD-10-CM

## 2017-12-16 NOTE — Progress Notes (Signed)
   PRENATAL VISIT NOTE  Subjective:  Nicole Love is a [age] y.o. G5P2013 at [redacted]w[redacted]d being seen today for ongoing prenatal care.  She is currently monitored for the following issues for this high-risk pregnancy and has Supervision of other normal pregnancy, antepartum; Hyperthyroidism; and Urinary tract infection on their problem list.  Patient reports no complaints.  Contractions: Not present. Vag. Bleeding: None.  Movement: Present. Denies leaking of fluid.   The following portions of the patient's history were reviewed and updated as appropriate: allergies, current medications, past family history, past medical history, past social history, past surgical history and problem list. Problem list updated.  Objective:   Vitals:   12/16/17 1316 12/16/17 1318  BP: (!) 87/59 (!) 97/55  Pulse: 76 72  Weight: 150 lb 3.2 oz (68.1 kg)     Fetal Status: Fetal Heart Rate (bpm): 141   Movement: Present     General:  Alert, oriented and cooperative. Patient is in no acute distress.  Skin: Skin is warm and dry. No rash noted.   Cardiovascular: Normal heart rate noted  Respiratory: Normal respiratory effort, no problems with respiration noted  Abdomen: Soft, gravid, appropriate for gestational age.  Pain/Pressure: Absent     Pelvic: Cervical exam deferred        Extremities: Normal range of motion.  Edema: Trace  Mental Status:  Normal mood and affect. Normal behavior. Normal judgment and thought content.   Assessment and Plan:  Pregnancy: G5P2013 at 634w4d  1. Supervision of other normal pregnancy, antepartum Patient is doing well without complaints Follow up anatomy ultrasound ordered AFP today - AFP, Serum, Open Spina Bifida  2. Hyperthyroidism Not currently on medication. Patient reports same issue with last pregnancy and was never on medication Scheduled to see endocrinologist [facility] on 3/21 Will repeat labs today - TSH - T3, free - T4, free  Preterm labor symptoms and  general obstetric precautions including but not limited to vaginal bleeding, contractions, leaking of fluid and fetal movement were reviewed in detail with the patient. Please refer to After Visit Summary for other counseling recommendations.  Return in about 4 weeks (around 01/13/2018) for ROB.   Catalina AntiguaPeggy Bradley [place]ide, MD

## 2017-12-17 LAB — T4, FREE: Free T4: 1.2 ng/dL (ref 0.82–1.77)

## 2017-12-17 LAB — T3, FREE: T3, Free: 3.3 pg/mL (ref 2.0–4.4)

## 2017-12-17 LAB — TSH: TSH: 0.796 u[IU]/mL (ref 0.450–4.500)

## 2017-12-21 ENCOUNTER — Other Ambulatory Visit: Payer: Self-pay | Admitting: Student

## 2017-12-21 ENCOUNTER — Ambulatory Visit: Payer: Medicaid Other

## 2017-12-21 ENCOUNTER — Ambulatory Visit (HOSPITAL_COMMUNITY)
Admission: RE | Admit: 2017-12-21 | Discharge: 2017-12-21 | Disposition: A | Discharge status: Home / Self Care | Payer: Medicaid Other | Source: Ambulatory Visit | Attending: Student | Admitting: Student

## 2017-12-21 ENCOUNTER — Encounter: Payer: Self-pay | Admitting: Student

## 2017-12-21 ENCOUNTER — Other Ambulatory Visit (HOSPITAL_COMMUNITY)
Admission: RE | Admit: 2017-12-21 | Discharge: 2017-12-21 | Disposition: A | Discharge status: Home / Self Care | Payer: Medicaid Other | Source: Ambulatory Visit | Attending: Obstetrics and Gynecology | Admitting: Obstetrics and Gynecology

## 2017-12-21 DIAGNOSIS — B373 Candidiasis of vulva and vagina: Secondary | ICD-10-CM | POA: Insufficient documentation

## 2017-12-21 DIAGNOSIS — Z3480 Encounter for supervision of other normal pregnancy, unspecified trimester: Secondary | ICD-10-CM

## 2017-12-21 DIAGNOSIS — O99281 Endocrine, nutritional and metabolic diseases complicating pregnancy, first trimester: Secondary | ICD-10-CM | POA: Diagnosis present

## 2017-12-21 DIAGNOSIS — Z3689 Encounter for other specified antenatal screening: Secondary | ICD-10-CM

## 2017-12-21 DIAGNOSIS — Z3A00 Weeks of gestation of pregnancy not specified: Secondary | ICD-10-CM | POA: Diagnosis not present

## 2017-12-21 DIAGNOSIS — E0590 Thyrotoxicosis, unspecified without thyrotoxic crisis or storm: Secondary | ICD-10-CM

## 2017-12-21 DIAGNOSIS — O98819 Other maternal infectious and parasitic diseases complicating pregnancy, unspecified trimester: Secondary | ICD-10-CM | POA: Diagnosis not present

## 2017-12-21 DIAGNOSIS — Z3A19 19 weeks gestation of pregnancy: Secondary | ICD-10-CM | POA: Insufficient documentation

## 2017-12-21 DIAGNOSIS — N898 Other specified noninflammatory disorders of vagina: Secondary | ICD-10-CM | POA: Diagnosis present

## 2017-12-21 DIAGNOSIS — O26899 Other specified pregnancy related conditions, unspecified trimester: Secondary | ICD-10-CM | POA: Insufficient documentation

## 2017-12-21 NOTE — Progress Notes (Signed)
Pt here today c/o vaginal discharge with slight odor. Pt instructed on how to self swab. Self swab obtained. Pt notified that we will contact her if treatment is needed.  Pt stated understanding with no further questions.

## 2017-12-21 NOTE — Progress Notes (Signed)
I have reviewed this chart and agree with the RN/CMA assessment and management.    K. Meryl Maryori Weide, M.D. Attending Obstetrician & Gynecologist, [facility], [facility]  

## 2017-12-22 ENCOUNTER — Other Ambulatory Visit: Payer: Self-pay | Admitting: Obstetrics and Gynecology

## 2017-12-22 ENCOUNTER — Other Ambulatory Visit (HOSPITAL_COMMUNITY): Payer: Self-pay | Admitting: *Deleted

## 2017-12-22 DIAGNOSIS — E0590 Thyrotoxicosis, unspecified without thyrotoxic crisis or storm: Secondary | ICD-10-CM

## 2017-12-22 LAB — CERVICOVAGINAL ANCILLARY ONLY
Bacterial vaginitis: NEGATIVE
CHLAMYDIA, DNA PROBE: NEGATIVE
Candida vaginitis: POSITIVE — AB
NEISSERIA GONORRHEA: NEGATIVE
Trichomonas: NEGATIVE

## 2017-12-22 MED ORDER — CLOTRIMAZOLE 1 % VA CREA: 1.0000 | TOPICAL_CREAM | Freq: Every day | VAGINAL | 2 refills | Status: DC

## 2017-12-24 ENCOUNTER — Encounter: Payer: Self-pay | Admitting: Student

## 2018-01-14 ENCOUNTER — Ambulatory Visit (INDEPENDENT_AMBULATORY_CARE_PROVIDER_SITE_OTHER): Payer: Medicaid Other | Admitting: Student

## 2018-01-14 VITALS — BP 109/64 | HR 79 | Wt 144.5 lb

## 2018-01-14 DIAGNOSIS — E0590 Thyrotoxicosis, unspecified without thyrotoxic crisis or storm: Secondary | ICD-10-CM

## 2018-01-14 DIAGNOSIS — N3000 Acute cystitis without hematuria: Secondary | ICD-10-CM

## 2018-01-14 DIAGNOSIS — Z3480 Encounter for supervision of other normal pregnancy, unspecified trimester: Secondary | ICD-10-CM

## 2018-01-14 NOTE — Progress Notes (Addendum)
Patient ID: Nicole Love Facer, female   DOB: 01/09/1991, 27 y.o.   MRN: [id number]   PRENATAL VISIT NOTE  Subjective:  Nicole Love Ruggles is a 27 y.o. G5P2013 at 438w5d being seen today for ongoing prenatal care.  She is currently monitored for the following issues for this high-risk pregnancy and has Supervision of other normal pregnancy, antepartum; Hyperthyroidism; Urinary tract infection; and Previous cesarean delivery, antepartum on their problem list.  Patient reports no complaints.  She is feeling well; only a little nauseated. No feelings of sweating, weight loss or palpitations. Had her blood drawn at Endocrinology but has not seen provider there yet.   Contractions: Irregular. Vag. Bleeding: None.  Movement: Present. Denies leaking of fluid.   The following portions of the patient's history were reviewed and updated as appropriate: allergies, current medications, past family history, past medical history, past social history, past surgical history and problem list. Problem list updated.  Objective:   Vitals:   01/14/18 1055  BP: 109/64  Pulse: 79  Weight: 144 lb 8 oz (65.5 kg)    Fetal Status: Fetal Heart Rate (bpm): 148 Fundal Height: 22 cm Movement: Present     General:  Alert, oriented and cooperative. Patient is in no acute distress.  Skin: Skin is warm and dry. No rash noted.   Cardiovascular: Normal heart rate noted  Respiratory: Normal respiratory effort, no problems with respiration noted  Abdomen: Soft, gravid, appropriate for gestational age.  Pain/Pressure: Present     Pelvic: Cervical exam deferred        Extremities: Normal range of motion.  Edema: Trace  Mental Status: Normal mood and affect. Normal behavior. Normal judgment and thought content.   Assessment and Plan:  Pregnancy: G5P2013 at 438w5d  1. Encounter for supervision of normal pregnancy in multigravida  - AFP, Serum, Open Spina Bifida  2. Supervision of other normal pregnancy, antepartum -Signed VBAC  consent today  3. Acute cystitis without hematuria Follow up TOC today - Culture, OB Urine  4. Hyperthyroidism _planning to [facility] early May - T4, free - T3, free - TSH  Preterm labor symptoms and general obstetric precautions including but not limited to vaginal bleeding, contractions, leaking of fluid and fetal movement were reviewed in detail with the patient. Please refer to After Visit Summary for other counseling recommendations.  No follow-ups on file.  Future Appointments  Date Time Provider Department Center  02/01/2018 10:30 AM [facility] US 1 [facility]  02/04/2018 10:15 AM Romero BellingEllison, Sean, MD [facility] None    Marylene LandKathryn Lorraine Kooistra, CNM

## 2018-01-14 NOTE — Patient Instructions (Signed)
Alpha-Fetoprotein Test Why am I having this test? This test is used to screen for birth defects, such as chromosomal abnormalities, neural tube defects, and body wall defects. It can also be used as a tumor marker for certain cancers. Alpha-Fetoprotein (AFP) is a protein that is made by the fetal liver. Levels can be detected during pregnancy, starting at [redacted] weeks gestation and peaking at 16-[redacted] weeks gestation. Your health care provider may perform this test if you are pregnant or if a tumor is suspected. What kind of sample is taken? A blood sample is required for this test. It is usually collected by inserting a needle into a vein. How do I prepare for this test? There is no preparation or fasting required for this test. What are the reference ranges? References ranges are considered healthy ranges established after testing a large group of healthy people. Reference ranges may vary among different people, labs, and hospitals. It is your responsibility to obtain your test results. Ask the lab or department performing the test when and how you will get your results. Reference ranges for AFP are the following:  Adult: Less than 40 ng/mL or less than 40 mcg/L (SI units).  Child younger than 1 year: Less than 30 ng/mL.  Ranges are stratified by weeks of gestation, and they vary among laboratories. What do the results mean? Values above the reference ranges in pregnant women may indicate:  Neural tube defects.  Abdominal wall defects.  Multiple pregnancy.  Congenital abnormalities.  Fetal distress or fetal death.  Values above the reference ranges in nonpregnant women may indicate:  Reproductive cancers.  Liver cancer.  Liver cell death.  Other types of cancer.  Values below the reference ranges in pregnant women may indicate:  Down syndrome.  Fetal death.  Talk with your health care provider to discuss your results, treatment options, and if necessary, the need for more  tests. Talk with your health care provider if you have any questions about your results. Talk with your health care provider to discuss your results, treatment options, and if necessary, the need for more tests. Talk with your health care provider if you have any questions about your results. This information is not intended to replace advice given to you by your health care provider. Make sure you discuss any questions you have with your health care provider. Document Released: 10/16/2004 Document Revised: 05/26/2016 Document Reviewed: 02/24/2014 Elsevier Interactive Patient Education  2018 Elsevier Inc.  

## 2018-01-14 NOTE — Progress Notes (Signed)
C/o braxton hicks contractions occasionally

## 2018-01-16 LAB — AFP, SERUM, OPEN SPINA BIFIDA
AFP MoM: 1.07
AFP Value: 78.6 ng/mL
GEST. AGE ON COLLECTION DATE: 22.5 wk
MATERNAL AGE AT EDD: 26 a
OSBR Risk 1 IN: 9862
Test Results:: NEGATIVE
WEIGHT: 144 [lb_av]

## 2018-01-16 LAB — TSH: TSH: 0.399 u[IU]/mL — ABNORMAL LOW (ref 0.450–4.500)

## 2018-01-16 LAB — T3, FREE: T3 FREE: 2.7 pg/mL (ref 2.0–4.4)

## 2018-01-16 LAB — T4, FREE: FREE T4: 1.49 ng/dL (ref 0.82–1.77)

## 2018-01-18 LAB — URINE CULTURE, OB REFLEX

## 2018-01-18 LAB — CULTURE, OB URINE

## 2018-01-19 ENCOUNTER — Encounter: Payer: Self-pay | Admitting: *Deleted

## 2018-01-21 ENCOUNTER — Other Ambulatory Visit: Payer: Self-pay | Admitting: Student

## 2018-01-21 ENCOUNTER — Telehealth: Payer: Self-pay | Admitting: Student

## 2018-01-21 DIAGNOSIS — N3000 Acute cystitis without hematuria: Secondary | ICD-10-CM

## 2018-01-21 MED ORDER — AMOXICILLIN-POT CLAVULANATE 875-125 MG PO TABS: 1.0000 | ORAL_TABLET | Freq: Two times a day (BID) | ORAL | 0 refills | Status: DC

## 2018-01-21 NOTE — Telephone Encounter (Signed)
Called patient and left message that Augmentin was waiting for her pharmacy.

## 2018-01-21 NOTE — Telephone Encounter (Signed)
Left message with patient asking if she had totally taken all of her keflex for her UTI, and tell her that we needed to switch her to a new antibiotic if she did, in fact, take her keflex. Gave her MAU number and asked her to call me back by 10 pm if she could, otherwise call the clinic.

## 2018-01-28 ENCOUNTER — Encounter (HOSPITAL_COMMUNITY): Payer: Self-pay

## 2018-01-28 ENCOUNTER — Encounter: Payer: Self-pay | Admitting: *Deleted

## 2018-02-01 ENCOUNTER — Ambulatory Visit (HOSPITAL_COMMUNITY)
Admission: RE | Admit: 2018-02-01 | Discharge: 2018-02-01 | Disposition: A | Discharge status: Home / Self Care | Payer: Medicaid Other | Source: Ambulatory Visit | Attending: Student | Admitting: Student

## 2018-02-01 ENCOUNTER — Other Ambulatory Visit (HOSPITAL_COMMUNITY): Payer: Self-pay | Admitting: *Deleted

## 2018-02-01 ENCOUNTER — Encounter (HOSPITAL_COMMUNITY): Payer: Self-pay

## 2018-02-01 DIAGNOSIS — O99282 Endocrine, nutritional and metabolic diseases complicating pregnancy, second trimester: Secondary | ICD-10-CM | POA: Insufficient documentation

## 2018-02-01 DIAGNOSIS — Z362 Encounter for other antenatal screening follow-up: Secondary | ICD-10-CM | POA: Diagnosis not present

## 2018-02-01 DIAGNOSIS — E0590 Thyrotoxicosis, unspecified without thyrotoxic crisis or storm: Secondary | ICD-10-CM | POA: Insufficient documentation

## 2018-02-01 DIAGNOSIS — Z3A25 25 weeks gestation of pregnancy: Secondary | ICD-10-CM | POA: Insufficient documentation

## 2018-02-02 ENCOUNTER — Telehealth: Payer: Self-pay | Admitting: Student

## 2018-02-02 NOTE — Telephone Encounter (Addendum)
Left message for patient telling her that she had antibiotic at the pharmacy. Stressed the importance of taking all her medication, and that we will need to check her urine again in a month.  Nicole Love

## 2018-02-04 ENCOUNTER — Ambulatory Visit: Payer: Medicaid Other | Admitting: Endocrinology

## 2018-02-08 ENCOUNTER — Ambulatory Visit (INDEPENDENT_AMBULATORY_CARE_PROVIDER_SITE_OTHER): Payer: Medicaid Other | Admitting: Family Medicine

## 2018-02-08 VITALS — BP 116/70 | HR 74 | Wt 150.3 lb

## 2018-02-08 DIAGNOSIS — Z3480 Encounter for supervision of other normal pregnancy, unspecified trimester: Secondary | ICD-10-CM

## 2018-02-08 DIAGNOSIS — O34219 Maternal care for unspecified type scar from previous cesarean delivery: Secondary | ICD-10-CM

## 2018-02-08 DIAGNOSIS — E0590 Thyrotoxicosis, unspecified without thyrotoxic crisis or storm: Secondary | ICD-10-CM

## 2018-02-08 NOTE — Progress Notes (Signed)
   PRENATAL VISIT NOTE  Subjective:  Nicole Love is a 27 y.o. [id number] at [redacted]w[redacted]d being seen today for ongoing prenatal care.  She is currently monitored for the following issues for this high-risk pregnancy and has Supervision of high risk pregnancy, antepartum, third trimester; Hyperthyroidism; Urinary tract infection; and Previous cesarean delivery, antepartum on their problem list.  Patient reports no complaints.  Contractions: Irritability. Vag. Bleeding: None.  Movement: Present. Denies leaking of fluid.   The following portions of the patient's history were reviewed and updated as appropriate: allergies, current medications, past family history, past medical history, past social history, past surgical history and problem list. Problem list updated.  Objective:   Vitals:   02/08/18 1418  BP: 116/70  Pulse: 74  Weight: 150 lb 4.8 oz (68.2 kg)    Fetal Status: Fetal Heart Rate (bpm): 145   Movement: Present     General:  Alert, oriented and cooperative. Patient is in no acute distress.  Skin: Skin is warm and dry. No rash noted.   Cardiovascular: Normal heart rate noted  Respiratory: Normal respiratory effort, no problems with respiration noted  Abdomen: Soft, gravid, appropriate for gestational age.  Pain/Pressure: Present     Pelvic: Cervical exam deferred        Extremities: Normal range of motion.  Edema: None  Mental Status: Normal mood and affect. Normal behavior. Normal judgment and thought content.   Assessment and Plan:  Pregnancy: G9F6213 at [redacted]w[redacted]d  1. Supervision of other normal pregnancy, antepartum FHT and FH normal. PP BTL signed today. - CBC - Glucose Tolerance, 2 Hours w/1 Hour - HIV antibody - RPR  2. Hyperthyroidism Check TSH, T3, T4. Fairly well controlled off medication  3. Previous cesarean delivery, antepartum Desires TOLAC  Preterm labor symptoms and general obstetric precautions including but not limited to vaginal bleeding, contractions,  leaking of fluid and fetal movement were reviewed in detail with the patient. Please refer to After Visit Summary for other counseling recommendations.  No follow-ups on file.  Future Appointments  Date Time Provider Department Center  02/08/2018  2:55 PM Noralee Chars [facility]  03/15/2018 11:15 AM [facility] [place] 4 [facility]    Levie Heritage, DO

## 2018-02-09 ENCOUNTER — Encounter (INDEPENDENT_AMBULATORY_CARE_PROVIDER_SITE_OTHER): Payer: Self-pay

## 2018-02-09 LAB — CBC
HEMOGLOBIN: 10.9 g/dL — AB (ref 11.1–15.9)
Hematocrit: 32.3 % — ABNORMAL LOW (ref 34.0–46.6)
MCH: 30.8 pg (ref 26.6–33.0)
MCHC: 33.7 g/dL (ref 31.5–35.7)
MCV: 91 fL (ref 79–97)
PLATELETS: 239 10*3/uL (ref 150–379)
RBC: 3.54 x10E6/uL — AB (ref 3.77–5.28)
RDW: 13.5 % (ref 12.3–15.4)
WBC: 12.3 10*3/uL — AB (ref 3.4–10.8)

## 2018-02-09 LAB — GLUCOSE TOLERANCE, 2 HOURS W/ 1HR
GLUCOSE, 1 HOUR: 89 mg/dL (ref 65–179)
GLUCOSE, 2 HOUR: 75 mg/dL (ref 65–152)
GLUCOSE, FASTING: 76 mg/dL (ref 65–91)

## 2018-02-09 LAB — TSH: TSH: 1.2 u[IU]/mL (ref 0.450–4.500)

## 2018-02-09 LAB — T3, FREE: T3 FREE: 3.3 pg/mL (ref 2.0–4.4)

## 2018-02-09 LAB — T4, FREE: Free T4: 1.29 ng/dL (ref 0.82–1.77)

## 2018-02-09 LAB — HIV ANTIBODY (ROUTINE TESTING W REFLEX): HIV SCREEN 4TH GENERATION: NONREACTIVE

## 2018-02-09 LAB — RPR: RPR: NONREACTIVE

## 2018-02-10 ENCOUNTER — Encounter: Payer: Self-pay | Admitting: *Deleted

## 2018-02-23 ENCOUNTER — Encounter: Payer: Self-pay | Admitting: Student

## 2018-02-24 ENCOUNTER — Other Ambulatory Visit: Payer: Self-pay | Admitting: General Practice

## 2018-02-24 DIAGNOSIS — B379 Candidiasis, unspecified: Secondary | ICD-10-CM

## 2018-02-24 DIAGNOSIS — T3695XA Adverse effect of unspecified systemic antibiotic, initial encounter: Principal | ICD-10-CM

## 2018-02-24 MED ORDER — TERCONAZOLE 0.4 % VA CREA: 1.0000 | TOPICAL_CREAM | Freq: Every day | VAGINAL | 0 refills | Status: DC

## 2018-03-11 ENCOUNTER — Encounter: Payer: Medicaid Other | Admitting: Obstetrics and Gynecology

## 2018-03-15 ENCOUNTER — Encounter (HOSPITAL_COMMUNITY): Payer: Self-pay

## 2018-03-15 ENCOUNTER — Ambulatory Visit (HOSPITAL_COMMUNITY)
Admission: RE | Admit: 2018-03-15 | Discharge: 2018-03-15 | Disposition: A | Discharge status: Home / Self Care | Payer: Medicaid Other | Source: Ambulatory Visit | Attending: Student | Admitting: Student

## 2018-03-15 DIAGNOSIS — Z362 Encounter for other antenatal screening follow-up: Secondary | ICD-10-CM | POA: Diagnosis not present

## 2018-03-15 DIAGNOSIS — O99280 Endocrine, nutritional and metabolic diseases complicating pregnancy, unspecified trimester: Secondary | ICD-10-CM

## 2018-03-15 DIAGNOSIS — E079 Disorder of thyroid, unspecified: Secondary | ICD-10-CM | POA: Insufficient documentation

## 2018-03-15 DIAGNOSIS — Z3A31 31 weeks gestation of pregnancy: Secondary | ICD-10-CM | POA: Insufficient documentation

## 2018-03-22 ENCOUNTER — Encounter: Payer: Self-pay | Admitting: Student

## 2018-03-22 ENCOUNTER — Encounter: Payer: Medicaid Other | Admitting: Family Medicine

## 2018-03-22 MED ORDER — FERROUS GLUCONATE 324 (38 FE) MG PO TABS: 324.0000 mg | ORAL_TABLET | Freq: Two times a day (BID) | ORAL | 3 refills | Status: DC

## 2018-04-09 ENCOUNTER — Other Ambulatory Visit: Payer: Self-pay | Admitting: Student

## 2018-04-09 DIAGNOSIS — Z3A35 35 weeks gestation of pregnancy: Secondary | ICD-10-CM

## 2018-04-09 DIAGNOSIS — Z362 Encounter for other antenatal screening follow-up: Secondary | ICD-10-CM

## 2018-04-12 ENCOUNTER — Ambulatory Visit (HOSPITAL_COMMUNITY)
Admission: RE | Admit: 2018-04-12 | Discharge: 2018-04-12 | Disposition: A | Discharge status: Home / Self Care | Payer: Medicaid Other | Source: Ambulatory Visit | Attending: Student | Admitting: Student

## 2018-04-12 ENCOUNTER — Other Ambulatory Visit (HOSPITAL_COMMUNITY): Payer: Self-pay | Admitting: *Deleted

## 2018-04-12 ENCOUNTER — Encounter: Payer: Self-pay | Admitting: Obstetrics & Gynecology

## 2018-04-12 ENCOUNTER — Encounter: Payer: Medicaid Other | Admitting: Obstetrics & Gynecology

## 2018-04-12 ENCOUNTER — Encounter (HOSPITAL_COMMUNITY): Payer: Self-pay

## 2018-04-12 DIAGNOSIS — O359XX0 Maternal care for (suspected) fetal abnormality and damage, unspecified, not applicable or unspecified: Secondary | ICD-10-CM | POA: Diagnosis not present

## 2018-04-12 DIAGNOSIS — O99283 Endocrine, nutritional and metabolic diseases complicating pregnancy, third trimester: Secondary | ICD-10-CM | POA: Diagnosis not present

## 2018-04-12 DIAGNOSIS — Z362 Encounter for other antenatal screening follow-up: Secondary | ICD-10-CM | POA: Insufficient documentation

## 2018-04-12 DIAGNOSIS — E0590 Thyrotoxicosis, unspecified without thyrotoxic crisis or storm: Secondary | ICD-10-CM | POA: Diagnosis not present

## 2018-04-12 DIAGNOSIS — O34219 Maternal care for unspecified type scar from previous cesarean delivery: Secondary | ICD-10-CM | POA: Diagnosis not present

## 2018-04-12 DIAGNOSIS — Z7689 Persons encountering health services in other specified circumstances: Secondary | ICD-10-CM | POA: Diagnosis not present

## 2018-04-12 DIAGNOSIS — Z3A35 35 weeks gestation of pregnancy: Secondary | ICD-10-CM | POA: Insufficient documentation

## 2018-04-14 ENCOUNTER — Encounter: Payer: Medicaid Other | Admitting: Obstetrics & Gynecology

## 2018-04-15 ENCOUNTER — Other Ambulatory Visit: Payer: Self-pay

## 2018-04-15 ENCOUNTER — Inpatient Hospital Stay (HOSPITAL_COMMUNITY)
Admission: AD | Admit: 2018-04-15 | Discharge: 2018-04-15 | Disposition: A | Discharge status: Home / Self Care | Payer: Medicaid Other | Source: Ambulatory Visit | Attending: Obstetrics & Gynecology | Admitting: Obstetrics & Gynecology

## 2018-04-15 ENCOUNTER — Encounter (HOSPITAL_COMMUNITY): Payer: Self-pay | Admitting: *Deleted

## 2018-04-15 DIAGNOSIS — G43B0 Ophthalmoplegic migraine, not intractable: Secondary | ICD-10-CM

## 2018-04-15 DIAGNOSIS — G43809 Other migraine, not intractable, without status migrainosus: Secondary | ICD-10-CM | POA: Diagnosis not present

## 2018-04-15 DIAGNOSIS — Z3A35 35 weeks gestation of pregnancy: Secondary | ICD-10-CM | POA: Diagnosis not present

## 2018-04-15 DIAGNOSIS — R51 Headache: Secondary | ICD-10-CM | POA: Diagnosis present

## 2018-04-15 DIAGNOSIS — O368130 Decreased fetal movements, third trimester, not applicable or unspecified: Secondary | ICD-10-CM | POA: Diagnosis not present

## 2018-04-15 DIAGNOSIS — G5711 Meralgia paresthetica, right lower limb: Secondary | ICD-10-CM

## 2018-04-15 DIAGNOSIS — O26893 Other specified pregnancy related conditions, third trimester: Secondary | ICD-10-CM | POA: Diagnosis not present

## 2018-04-15 LAB — URINALYSIS, ROUTINE W REFLEX MICROSCOPIC
Bilirubin Urine: NEGATIVE
GLUCOSE, UA: NEGATIVE mg/dL
Hgb urine dipstick: NEGATIVE
Ketones, ur: 20 mg/dL — AB
Nitrite: NEGATIVE
PH: 5 (ref 5.0–8.0)
Protein, ur: 30 mg/dL — AB
SPECIFIC GRAVITY, URINE: 1.031 — AB (ref 1.005–1.030)

## 2018-04-15 MED ORDER — BUTALBITAL-APAP-CAFFEINE 50-325-40 MG PO TABS
1.0000 | ORAL_TABLET | Freq: Once | ORAL | Status: AC
  Administered 2018-04-15: 1 via ORAL
  Filled 2018-04-15: qty 1

## 2018-04-15 MED ORDER — BUTALBITAL-APAP-CAFFEINE 50-325-40 MG PO CAPS: 1.0000 | ORAL_CAPSULE | ORAL | 3 refills | Status: DC | PRN

## 2018-04-15 NOTE — MAU Provider Note (Signed)
Chief Complaint:  Headache and Decreased Fetal Movement   First Provider Initiated Contact with Patient 04/15/18 2016     HPI: Nicole Love is a 27 y.o. [id number] at 43w5dwho presents to maternity admissions reporting headache which she rates as a "3" now.  States it started when she walked from work to home in the heat.  Had a short period of vision loss in right eye that resolved in 15 minutes. Has had this happen several times before, was told is related to migraines.  No visual disturbance now.  Also has intermittent numbness in right arm which has resolved.  Has intermittent numbness in front of right thigh, after sitting, then resolves. No other neurological signs now except mild headache   Tells me she feels fetal movement now. . She reports good fetal movement, denies LOF, vaginal bleeding, vaginal itching/burning, urinary symptoms, dizziness, n/v, diarrhea, constipation or fever/chills.    Headache   This is a recurrent problem. The current episode started today. The problem occurs intermittently. The problem has been gradually improving. The pain is located in the frontal region. The pain does not radiate. The pain quality is similar to prior headaches. The quality of the pain is described as aching. The pain is at a severity of 3/10. The pain is mild. Associated symptoms include blurred vision (earlier loss of vision right eye now resolved), numbness and a visual change. Pertinent negatives include no abdominal pain, back pain, fever, loss of balance, muscle aches, nausea, neck pain, photophobia, seizures, sinus pressure, sore throat, tingling or weakness. The symptoms are aggravated by fatigue. She has tried acetaminophen for the symptoms. The treatment provided mild relief.     RN Note: Hasn't felt her move in 2 days. Did feel her move x2 today.  Went to the dr's on the 8th, wasn't even moving on the [place], heart rate dropped but came back up.  Today at 1400 she went completely blind in right  eye, came back an hour later. Then her rt arm went numb from elbow to finger tips, started coming back around 1600.. Leg goes numb here and there, has experience that multiple times, just figured that was a nerve thing.  Has a massive headache right now, started when she was walking home in the heat.    Past Medical History: Past Medical History:  Diagnosis Date  . Hyperthyroidism   . Pregnancy     Past obstetric history: OB History  Gravida Para Term Preterm AB Living  5 3 3   1 3   SAB TAB Ectopic Multiple Live Births  1       3    # Outcome Date GA Lbr Len/2nd Weight Sex Delivery Anes PTL Lv  5 Current           4 Term 12/15/16 [redacted]w[redacted]d  7 lb 15 oz (3.6 kg) M Vag-Spont None N LIV     Birth Comments: System Generated. Please review and update pregnancy details.  3 Term 01/10/14 [redacted]w[redacted]d  7 lb 5 oz (3.317 kg) F Vag-Spont  N LIV  2 Term 12/12/10 [redacted]w[redacted]d  6 lb 11 oz (3.033 kg) F CS-Unspec  N LIV     Birth Comments: failure to progress   1 SAB             Past Surgical History: Past Surgical History:  Procedure Laterality Date  . CESAREAN SECTION      Family History: Family History  Problem Relation Age of Onset  . Diabetes  Maternal Grandmother     Social History: Social History   Tobacco Use  . Smoking status: Never Smoker  . Smokeless tobacco: Never Used  Substance Use Topics  . Alcohol use: No  . Drug use: No    Allergies: No Known Allergies  Meds:  Medications Prior to Admission  Medication Sig Dispense Refill Last Dose  . ferrous gluconate (FERGON) 324 MG tablet Take 1 tablet (324 mg total) by mouth 2 (two) times daily with a meal. 60 tablet 3 Taking  . ondansetron (ZOFRAN-ODT) 8 MG disintegrating tablet Take 1 tablet (8 mg total) by mouth every 8 (eight) hours as needed for nausea or vomiting. 60 tablet 2 Taking  . Prenatal Vit-Fe Fumarate-FA (PREPLUS) 27-1 MG TABS Take 1 tablet by mouth daily. 30 tablet 9 Taking  . terconazole (TERAZOL 7) 0.4 % vaginal cream  Place 1 applicator vaginally at bedtime. (Patient not taking: Reported on 03/15/2018) 45 g 0 Not Taking    I have reviewed patient's Past Medical Hx, Surgical Hx, Family Hx, Social Hx, medications and allergies.   ROS:  Review of Systems  Constitutional: Negative for fever.  HENT: Negative for sinus pressure and sore throat.   Eyes: Positive for blurred vision (earlier loss of vision right eye now resolved). Negative for photophobia.  Gastrointestinal: Negative for abdominal pain and nausea.  Musculoskeletal: Negative for back pain and neck pain.  Neurological: Positive for numbness and headaches. Negative for tingling, seizures, weakness and loss of balance.   Other systems negative  Physical Exam   Patient Vitals for the past 24 hrs:  BP Temp Temp src Pulse Resp SpO2 Weight  04/15/18 1827 117/80 98.1 F (36.7 C) Oral 94 16 98 % 153 lb 12 oz (69.7 kg)   Constitutional: Well-developed, well-nourished female in no acute distress.  Cardiovascular: normal rate and rhythm Respiratory: normal effort, clear to auscultation bilaterally GI: Abd soft, non-tender, gravid appropriate for gestational age.    MS: Extremities nontender, no edema, normal ROM Neurologic: Alert and oriented x 4. Bilateral hand grips strong and equal.  Bilateral foot flexion and extension strong and equal.  Smile symmetrical.  Speech normal   Vision normal  GU: Neg CVAT.  PELVIC EXAM: Deferred  FHT:  Baseline 140 , moderate variability, accelerations present, no decelerations Contractions:  Irregular     Labs: Results for orders placed or performed during the hospital encounter of 04/15/18 (from the past 24 hour(s))  Urinalysis, Routine w reflex microscopic     Status: Abnormal   Collection Time: 04/15/18  7:01 PM  Result Value Ref Range   Color, Urine YELLOW YELLOW   APPearance HAZY (A) CLEAR   Specific Gravity, Urine 1.031 (H) 1.005 - 1.030   pH 5.0 5.0 - 8.0   Glucose, UA NEGATIVE NEGATIVE mg/dL   Hgb  urine dipstick NEGATIVE NEGATIVE   Bilirubin Urine NEGATIVE NEGATIVE   Ketones, ur 20 (A) NEGATIVE mg/dL   Protein, ur 30 (A) NEGATIVE mg/dL   Nitrite NEGATIVE NEGATIVE   Leukocytes, UA TRACE (A) NEGATIVE   RBC / HPF 0-5 0 - 5 RBC/hpf   WBC, UA 11-20 0 - 5 WBC/hpf   Bacteria, UA RARE (A) NONE SEEN   Squamous Epithelial / LPF 6-10 0 - 5   Mucus PRESENT    Test of cure sent (prior note requesting it for May,never done)  A/Positive/-- (01/17 1219)  Imaging:   MAU Course/MDM: I have ordered labs and reviewed results.   Urine sent for culture for  test of cure (prior note, never done) NST reviewed and is reassuring  Treatments in MAU included Fioricet for headache.  Could not wait for resolution of headache because she was notified her child is in ER with double ear infection. Wants to leave.  Clinically stable with no gross neuro deficits noted.    Assessment: Single intrauterine pregnancy at 2946w5d Headache, similar to prior migraines Loss of vision, resolved, similar to prior episodes with headache Meralgia Paresthetica right thigh, resolved, intermittent  Plan: Discharge home Fioricet given x 1 tablet since she is naive to this drug Rx Fioricet for prn use of headaches Follow up as scheduled in clinic Monday Preterm Labor precautions and fetal kick counts Follow up in Office for prenatal visits and recheck  Encouraged to return here or to other Urgent Care/ED if she develops worsening of symptoms, increase in pain, fever, or other concerning symptoms.   Pt stable at time of discharge.  Wynelle BourgeoisMarie Belissa Kooy CNM, MSN Certified Nurse-Midwife 04/15/2018 8:37 PM

## 2018-04-15 NOTE — MAU Note (Signed)
Running strip on FH in triage.  Pt reports feeling movement

## 2018-04-15 NOTE — Discharge Instructions (Signed)
Migraine Headache A migraine headache is an intense, throbbing pain on one side or both sides of the head. Migraines may also cause other symptoms, such as nausea, vomiting, and sensitivity to light and noise. What are the causes? Doing or taking certain things may also trigger migraines, such as:  Alcohol.  Smoking.  Medicines, such as: ? Medicine used to treat chest pain (nitroglycerine). ? Birth control pills. ? Estrogen pills. ? Certain blood pressure medicines.  Aged cheeses, chocolate, or caffeine.  Foods or drinks that contain nitrates, glutamate, aspartame, or tyramine.  Physical activity.  Other things that may trigger a migraine include:  Menstruation.  Pregnancy.  Hunger.  Stress, lack of sleep, too much sleep, or fatigue.  Weather changes.  What increases the risk? The following factors may make you more likely to experience migraine headaches:  Age. Risk increases with age.  Family history of migraine headaches.  Being Caucasian.  Depression and anxiety.  Obesity.  Being a woman.  Having a hole in the heart (patent foramen ovale) or other heart problems.  What are the signs or symptoms? The main symptom of this condition is pulsating or throbbing pain. Pain may:  Happen in any area of the head, such as on one side or both sides.  Interfere with daily activities.  Get worse with physical activity.  Get worse with exposure to bright lights or loud noises.  Other symptoms may include:  Nausea.  Vomiting.  Dizziness.  General sensitivity to bright lights, loud noises, or smells.  Before you get a migraine, you may get warning signs that a migraine is developing (aura). An aura may include:  Seeing flashing lights or having blind spots.  Seeing bright spots, halos, or zigzag lines.  Having tunnel vision or blurred vision.  Having numbness or a tingling feeling.  Having trouble talking.  Having muscle weakness.  How is this  diagnosed? A migraine headache can be diagnosed based on:  Your symptoms.  A physical exam.  Tests, such as CT scan or MRI of the head. These imaging tests can help rule out other causes of headaches.  Taking fluid from the spine (lumbar puncture) and analyzing it (cerebrospinal fluid analysis, or CSF analysis).  How is this treated? A migraine headache is usually treated with medicines that:  Relieve pain.  Relieve nausea.  Prevent migraines from coming back.  Treatment may also include:  Acupuncture.  Lifestyle changes like avoiding foods that trigger migraines.  Follow these instructions at home: Medicines  Take over-the-counter and prescription medicines only as told by your health care provider.  Do not drive or use heavy machinery while taking prescription pain medicine.  To prevent or treat constipation while you are taking prescription pain medicine, your health care provider may recommend that you: ? Drink enough fluid to keep your urine clear or pale yellow. ? Take over-the-counter or prescription medicines. ? Eat foods that are high in fiber, such as fresh fruits and vegetables, whole grains, and beans. ? Limit foods that are high in fat and processed sugars, such as fried and sweet foods. Lifestyle  Avoid alcohol use.  Do not use any products that contain nicotine or tobacco, such as cigarettes and e-cigarettes. If you need help quitting, ask your health care provider.  Get at least 8 hours of sleep every night.  Limit your stress. General instructions   Keep a journal to find out what may trigger your migraine headaches. For example, write down: ? What you eat and  drink. ? How much sleep you get. ? Any change to your diet or medicines.  If you have a migraine: ? Avoid things that make your symptoms worse, such as bright lights. ? It may help to lie down in a dark, quiet room. ? Do not drive or use heavy machinery. ? Ask your health care provider  what activities are safe for you while you are experiencing symptoms.  Keep all follow-up visits as told by your health care provider. This is important. Contact a health care provider if:  You develop symptoms that are different or more severe than your usual migraine symptoms. Get help right away if:  Your migraine becomes severe.  You have a fever.  You have a stiff neck.  You have vision loss.  Your muscles feel weak or like you cannot control them.  You start to lose your balance often.  You develop trouble walking.  You faint. This information is not intended to replace advice given to you by your health care provider. Make sure you discuss any questions you have with your health care provider. Document Released: 09/22/2005 Document Revised: 04/11/2016 Document Reviewed: 03/10/2016 Elsevier Interactive Patient Education  2017 Elsevier Inc.   Paresthesia Paresthesia is an abnormal burning or prickling sensation. This sensation is generally felt in the hands, arms, legs, or feet. However, it may occur in any part of the body. Usually, it is not painful. The feeling may be described as:  Tingling or numbness.  Pins and needles.  Skin crawling.  Buzzing.  Limbs falling asleep.  Itching.  Most people experience temporary (transient) paresthesia at some time in their lives. Paresthesia may occur when you breathe too quickly (hyperventilation). It can also occur without any apparent cause. Commonly, paresthesia occurs when pressure is placed on a nerve. The sensation quickly goes away after the pressure is removed. For some people, however, paresthesia is a long-lasting (chronic) condition that is caused by an underlying disorder. If you continue to have paresthesia, you may need further medical evaluation. Follow these instructions at home: Watch your condition for any changes. Taking the following actions may help to lessen any discomfort that you are feeling:  Avoid  drinking alcohol.  Try acupuncture or massage to help relieve your symptoms.  Keep all follow-up visits as directed by your health care provider. This is important.  Contact a health care provider if:  You continue to have episodes of paresthesia.  Your burning or prickling feeling gets worse when you walk.  You have pain, cramps, or dizziness.  You develop a rash. Get help right away if:  You feel weak.  You have trouble walking or moving.  You have problems with speech, understanding, or vision.  You feel confused.  You cannot control your bladder or bowel movements.  You have numbness after an injury.  You faint. This information is not intended to replace advice given to you by your health care provider. Make sure you discuss any questions you have with your health care provider. Document Released: 09/12/2002 Document Revised: 02/28/2016 Document Reviewed: 09/18/2014 Elsevier Interactive Patient Education  [employer]2018 Elsevier Inc.

## 2018-04-15 NOTE — MAU Note (Signed)
Hasn't felt her move in 2 days. Did feel her move x2 today.  Went to the dr's on the 8th, wasn't even moving on the [place], heart rate dropped but came back up.  Today at 1400 she went completely blind in right eye, came back an hour later. Then her rt arm went numb from elbow to finger tips, started coming back around 1600.. Leg goes numb here and there, has experience that multiple times, just figured that was a nerve thing.  Has a massive headache right now, started when she was walking home in the heat.

## 2018-04-18 LAB — CULTURE, OB URINE: SPECIAL REQUESTS: NORMAL

## 2018-04-19 ENCOUNTER — Encounter: Payer: Medicaid Other | Admitting: Obstetrics & Gynecology

## 2018-04-29 ENCOUNTER — Ambulatory Visit (INDEPENDENT_AMBULATORY_CARE_PROVIDER_SITE_OTHER): Payer: Medicaid Other | Admitting: Student

## 2018-04-29 ENCOUNTER — Other Ambulatory Visit (HOSPITAL_COMMUNITY)
Admission: RE | Admit: 2018-04-29 | Discharge: 2018-04-29 | Disposition: A | Discharge status: Home / Self Care | Payer: Medicaid Other | Source: Ambulatory Visit | Attending: Student | Admitting: Student

## 2018-04-29 VITALS — BP 110/74 | HR 79 | Wt 156.7 lb

## 2018-04-29 DIAGNOSIS — O0993 Supervision of high risk pregnancy, unspecified, third trimester: Secondary | ICD-10-CM

## 2018-04-29 DIAGNOSIS — E0590 Thyrotoxicosis, unspecified without thyrotoxic crisis or storm: Secondary | ICD-10-CM

## 2018-04-29 DIAGNOSIS — Z3A37 37 weeks gestation of pregnancy: Secondary | ICD-10-CM | POA: Insufficient documentation

## 2018-04-29 DIAGNOSIS — N3000 Acute cystitis without hematuria: Secondary | ICD-10-CM

## 2018-04-29 DIAGNOSIS — Z7689 Persons encountering health services in other specified circumstances: Secondary | ICD-10-CM | POA: Diagnosis not present

## 2018-04-29 DIAGNOSIS — Z23 Encounter for immunization: Secondary | ICD-10-CM | POA: Diagnosis not present

## 2018-04-29 DIAGNOSIS — O34219 Maternal care for unspecified type scar from previous cesarean delivery: Secondary | ICD-10-CM | POA: Diagnosis not present

## 2018-04-29 LAB — OB RESULTS CONSOLE GC/CHLAMYDIA: Gonorrhea: NEGATIVE

## 2018-04-29 LAB — OB RESULTS CONSOLE GBS: GBS: NEGATIVE

## 2018-04-29 MED ORDER — CEPHALEXIN 500 MG PO CAPS: 500.0000 mg | ORAL_CAPSULE | Freq: Four times a day (QID) | ORAL | 0 refills | Status: DC

## 2018-04-29 NOTE — Progress Notes (Signed)
   PRENATAL VISIT NOTE  Subjective:  Nicole Love is a 27 y.o. [phone] at 2667w5d being seen today for ongoing prenatal care.  She is currently monitored for the following issues for this low-risk pregnancy and has Supervision of high risk pregnancy, antepartum, third trimester; Hyperthyroidism; Urinary tract infection; Previous cesarean delivery, antepartum; and Thyroid disease affecting pregnancy on their problem list.  Patient reports no complaints.  Contractions: Not present. Vag. Bleeding: None.  Movement: Present. Denies leaking of fluid.   The following portions of the patient's history were reviewed and updated as appropriate: allergies, current medications, past family history, past medical history, past social history, past surgical history and problem list. Problem list updated.  Objective:   Vitals:   04/29/18 0915  BP: 110/74  Pulse: 79  Weight: 156 lb 11.2 oz (71.1 kg)    Fetal Status: Fetal Heart Rate (bpm): 140 Fundal Height: 37 cm Movement: Present  Presentation: Vertex  General:  Alert, oriented and cooperative. Patient is in no acute distress.  Skin: Skin is warm and dry. No rash noted.   Cardiovascular: Normal heart rate noted  Respiratory: Normal respiratory effort, no problems with respiration noted  Abdomen: Soft, gravid, appropriate for gestational age.  Pain/Pressure: Absent     Pelvic: Cervical exam performed Dilation: Fingertip      Extremities: Normal range of motion.  Edema: None  Mental Status: Normal mood and affect. Normal behavior. Normal judgment and thought content.   Assessment and Plan:  Pregnancy: [id number] at 2667w5d  1. Hyperthyroidism -feeling well, no sweating, NV - TSH + free T4 - T3, free  2. Supervision of high risk pregnancy, antepartum, third trimester  - GC[facility]Chlamydia [facility] amp [facility] - Culture, beta strep (group b only) - Tdap vaccine greater than or equal to 7yo IM  3. Acute cystitis without hematuria With  treat with keflex for 10 days  4. Previous cesarean delivery, antepartum -still desires TOLAC   Term labor symptoms and general obstetric precautions including but not limited to vaginal bleeding, contractions, leaking of fluid and fetal movement were reviewed in detail with the patient. Please refer to After Visit Summary for other counseling recommendations.  Return in about 1 week (around 05/06/2018).  Future Appointments  Date Time Provider Department Center  05/10/2018  9:45 AM [facility] US 2 [facility]    Marylene LandKathryn Lorraine Chevelle Durr, CNM

## 2018-04-29 NOTE — Patient Instructions (Signed)

## 2018-04-30 LAB — GC/CHLAMYDIA PROBE AMP (~~LOC~~) NOT AT ARMC
Chlamydia: NEGATIVE
NEISSERIA GONORRHEA: NEGATIVE

## 2018-04-30 LAB — T3, FREE: T3, Free: 3.1 pg/mL (ref 2.0–4.4)

## 2018-04-30 LAB — TSH+FREE T4
FREE T4: 1.18 ng/dL (ref 0.82–1.77)
TSH: 1.4 u[IU]/mL (ref 0.450–4.500)

## 2018-05-03 ENCOUNTER — Encounter: Payer: Self-pay | Admitting: Student

## 2018-05-03 LAB — CULTURE, BETA STREP (GROUP B ONLY): STREP GP B CULTURE: NEGATIVE

## 2018-05-04 ENCOUNTER — Encounter: Payer: Self-pay | Admitting: Obstetrics and Gynecology

## 2018-05-05 ENCOUNTER — Other Ambulatory Visit: Payer: Self-pay | Admitting: General Practice

## 2018-05-05 DIAGNOSIS — O219 Vomiting of pregnancy, unspecified: Secondary | ICD-10-CM

## 2018-05-05 MED ORDER — ONDANSETRON 8 MG PO TBDP: 8.0000 mg | ORAL_TABLET | Freq: Three times a day (TID) | ORAL | 0 refills | Status: DC | PRN

## 2018-05-06 ENCOUNTER — Encounter: Payer: Medicaid Other | Admitting: Obstetrics & Gynecology

## 2018-05-06 ENCOUNTER — Encounter: Payer: Self-pay | Admitting: Obstetrics and Gynecology

## 2018-05-06 ENCOUNTER — Encounter: Payer: Self-pay | Admitting: Obstetrics & Gynecology

## 2018-05-06 ENCOUNTER — Encounter: Payer: Medicaid Other | Admitting: Obstetrics and Gynecology

## 2018-05-10 ENCOUNTER — Encounter (HOSPITAL_COMMUNITY): Payer: Self-pay | Admitting: *Deleted

## 2018-05-10 ENCOUNTER — Ambulatory Visit (HOSPITAL_COMMUNITY)
Admission: RE | Admit: 2018-05-10 | Discharge: 2018-05-10 | Disposition: A | Discharge status: Home / Self Care | Payer: Medicaid Other | Source: Ambulatory Visit | Attending: Student | Admitting: Student

## 2018-05-10 ENCOUNTER — Telehealth (HOSPITAL_COMMUNITY): Payer: Self-pay | Admitting: *Deleted

## 2018-05-10 ENCOUNTER — Ambulatory Visit (INDEPENDENT_AMBULATORY_CARE_PROVIDER_SITE_OTHER): Payer: Medicaid Other | Admitting: Obstetrics and Gynecology

## 2018-05-10 ENCOUNTER — Encounter: Payer: Self-pay | Admitting: Obstetrics and Gynecology

## 2018-05-10 ENCOUNTER — Encounter (HOSPITAL_COMMUNITY): Payer: Self-pay

## 2018-05-10 VITALS — BP 107/73 | HR 80 | Wt 158.6 lb

## 2018-05-10 DIAGNOSIS — O0993 Supervision of high risk pregnancy, unspecified, third trimester: Secondary | ICD-10-CM

## 2018-05-10 DIAGNOSIS — O99283 Endocrine, nutritional and metabolic diseases complicating pregnancy, third trimester: Secondary | ICD-10-CM

## 2018-05-10 DIAGNOSIS — E0590 Thyrotoxicosis, unspecified without thyrotoxic crisis or storm: Secondary | ICD-10-CM | POA: Diagnosis not present

## 2018-05-10 DIAGNOSIS — Z3A39 39 weeks gestation of pregnancy: Secondary | ICD-10-CM | POA: Insufficient documentation

## 2018-05-10 DIAGNOSIS — Z362 Encounter for other antenatal screening follow-up: Secondary | ICD-10-CM | POA: Insufficient documentation

## 2018-05-10 DIAGNOSIS — Z7689 Persons encountering health services in other specified circumstances: Secondary | ICD-10-CM | POA: Diagnosis not present

## 2018-05-10 DIAGNOSIS — O34219 Maternal care for unspecified type scar from previous cesarean delivery: Secondary | ICD-10-CM

## 2018-05-10 DIAGNOSIS — Z3009 Encounter for other general counseling and advice on contraception: Secondary | ICD-10-CM

## 2018-05-10 DIAGNOSIS — O283 Abnormal ultrasonic finding on antenatal screening of mother: Secondary | ICD-10-CM | POA: Insufficient documentation

## 2018-05-10 DIAGNOSIS — O359XX0 Maternal care for (suspected) fetal abnormality and damage, unspecified, not applicable or unspecified: Secondary | ICD-10-CM | POA: Diagnosis not present

## 2018-05-10 NOTE — Telephone Encounter (Signed)
Preadmission screen  

## 2018-05-10 NOTE — Progress Notes (Signed)
Patient did not keep her OB appointment for 05/10/2018.  Justis Dupas, Jr MD [facility]ending [facility] (Faculty Practice)   

## 2018-05-10 NOTE — Progress Notes (Signed)
   PRENATAL VISIT NOTE  Subjective:  Nicole RhineHannah L Love is a 27 y.o. [phone] at 4367w2d being seen today for ongoing prenatal care.  She is currently monitored for the following issues for this high-risk pregnancy and has Supervision of high risk pregnancy, antepartum, third trimester; Hyperthyroidism; Urinary tract infection; Previous cesarean delivery, antepartum; Thyroid disease affecting pregnancy; and Unwanted fertility on their problem list.  Patient reports pressure.  Contractions: Irritability. Vag. Bleeding: None.  Movement: Present. Denies leaking of fluid.   The following portions of the patient's history were reviewed and updated as appropriate: allergies, current medications, past family history, past medical history, past social history, past surgical history and problem list. Problem list updated.  Objective:   Vitals:   05/10/18 1113  BP: 107/73  Pulse: 80  Weight: 158 lb 9.6 oz (71.9 kg)    Fetal Status: Fetal Heart Rate (bpm): 144   Movement: Present     General:  Alert, oriented and cooperative. Patient is in no acute distress.  Skin: Skin is warm and dry. No rash noted.   Cardiovascular: Normal heart rate noted  Respiratory: Normal respiratory effort, no problems with respiration noted  Abdomen: Soft, gravid, appropriate for gestational age.  Pain/Pressure: Present     Pelvic: Cervical exam performed      1/thick/ballotable  Extremities: Normal range of motion.  Edema: None  Mental Status: Normal mood and affect. Normal behavior. Normal judgment and thought content.   Assessment and Plan:  Pregnancy: [id number] at 5967w2d  1. Supervision of high risk pregnancy, antepartum, third trimester  2. Previous cesarean delivery, antepartum For TOLAC  3. Hyperthyroidism normal labs 04/29/18 Repeat 1 month  4. Unwanted fertility BTL papers signed 02/08/18   Term labor symptoms and general obstetric precautions including but not limited to vaginal bleeding, contractions,  leaking of fluid and fetal movement were reviewed in detail with the patient. Please refer to After Visit Summary for other counseling recommendations.  Return in about 1 week (around 05/17/2018) for OB visit.  Future Appointments  Date Time Provider Department Center  05/22/2018  7:00 AM [facility] ROOM [facility] None    Conan BowensKelly M Davis, MD

## 2018-05-13 ENCOUNTER — Inpatient Hospital Stay (HOSPITAL_COMMUNITY)
Admission: AD | Admit: 2018-05-13 | Discharge: 2018-05-13 | Disposition: A | Discharge status: Home / Self Care | Payer: Medicaid Other | Source: Ambulatory Visit | Attending: Obstetrics & Gynecology | Admitting: Obstetrics & Gynecology

## 2018-05-13 ENCOUNTER — Encounter (HOSPITAL_COMMUNITY): Payer: Self-pay

## 2018-05-13 DIAGNOSIS — Z3A40 40 weeks gestation of pregnancy: Secondary | ICD-10-CM | POA: Diagnosis not present

## 2018-05-13 DIAGNOSIS — O479 False labor, unspecified: Secondary | ICD-10-CM | POA: Diagnosis not present

## 2018-05-13 NOTE — MAU Note (Signed)
Pt reports contractions since 12a but has not timed them. Pt denies LOF or vaginal bleeding. Reports a lot of pelvic pressure. Good fetal movement. Cervix has not been examined

## 2018-05-13 NOTE — Discharge Instructions (Signed)
Braxton Hicks Contractions °Contractions of the uterus can occur throughout pregnancy, but they are not always a sign that you are in labor. You may have practice contractions called Braxton Hicks contractions. These false labor contractions are sometimes confused with true labor. °What are Braxton Hicks contractions? °Braxton Hicks contractions are tightening movements that occur in the muscles of the uterus before labor. Unlike true labor contractions, these contractions do not result in opening (dilation) and thinning of the cervix. Toward the end of pregnancy (32-34 weeks), Braxton Hicks contractions can happen more often and may become stronger. These contractions are sometimes difficult to tell apart from true labor because they can be very uncomfortable. You should not feel embarrassed if you go to the hospital with false labor. °Sometimes, the only way to tell if you are in true labor is for your health care provider to look for changes in the cervix. The health care provider will do a physical exam and may monitor your contractions. If you are not in true labor, the exam should show that your cervix is not dilating and your water has not broken. °If there are other health problems associated with your pregnancy, it is completely safe for you to be sent home with false labor. You may continue to have Braxton Hicks contractions until you go into true labor. °How to tell the difference between true labor and false labor °True labor °· Contractions last 30-70 seconds. °· Contractions become very regular. °· Discomfort is usually felt in the top of the uterus, and it spreads to the lower abdomen and low back. °· Contractions do not go away with walking. °· Contractions usually become more intense and increase in frequency. °· The cervix dilates and gets thinner. °False labor °· Contractions are usually shorter and not as strong as true labor contractions. °· Contractions are usually irregular. °· Contractions  are often felt in the front of the lower abdomen and in the groin. °· Contractions may go away when you walk around or change positions while lying down. °· Contractions get weaker and are shorter-lasting as time goes on. °· The cervix usually does not dilate or become thin. °Follow these instructions at home: °· Take over-the-counter and prescription medicines only as told by your health care provider. °· Keep up with your usual exercises and follow other instructions from your health care provider. °· Eat and drink lightly if you think you are going into labor. °· If Braxton Hicks contractions are making you uncomfortable: °? Change your position from lying down or resting to walking, or change from walking to resting. °? Sit and rest in a tub of warm water. °? Drink enough fluid to keep your urine pale yellow. Dehydration may cause these contractions. °? Do slow and deep breathing several times an hour. °· Keep all follow-up prenatal visits as told by your health care provider. This is important. °Contact a health care provider if: °· You have a fever. °· You have continuous pain in your abdomen. °Get help right away if: °· Your contractions become stronger, more regular, and closer together. °· You have fluid leaking or gushing from your vagina. °· You pass blood-tinged mucus (bloody show). °· You have bleeding from your vagina. °· You have low back pain that you never had before. °· You feel your baby’s head pushing down and causing pelvic pressure. °· Your baby is not moving inside you as much as it used to. °Summary °· Contractions that occur before labor are called Braxton   Hicks contractions, false labor, or practice contractions. °· Braxton Hicks contractions are usually shorter, weaker, farther apart, and less regular than true labor contractions. True labor contractions usually become progressively stronger and regular and they become more frequent. °· Manage discomfort from Braxton Hicks contractions by  changing position, resting in a warm bath, drinking plenty of water, or practicing deep breathing. °This information is not intended to replace advice given to you by your health care provider. Make sure you discuss any questions you have with your health care provider. °Document Released: 02/05/2017 Document Revised: 02/05/2017 Document Reviewed: 02/05/2017 °Elsevier Interactive Patient Education © 2018 [employer] ° °Fetal Movement Counts °Patient Name: ________________________________________________ Patient Due Date: ____________________ °What is a fetal movement count? °A fetal movement count is the number of times that you feel your baby move during a certain amount of time. This may also be called a fetal kick count. A fetal movement count is recommended for every pregnant woman. You may be asked to start counting fetal movements as early as week 28 of your pregnancy. °Pay attention to when your baby is most active. You may notice your baby's sleep and wake cycles. You may also notice things that make your baby move more. You should do a fetal movement count: °· When your baby is normally most active. °· At the same time each day. ° °A good time to count movements is while you are resting, after having something to eat and drink. °How do I count fetal movements? °1. Find a quiet, comfortable area. Sit, or lie down on your side. °2. Write down the date, the start time and stop time, and the number of movements that you felt between those two times. Take this information with you to your health care visits. °3. For 2 hours, count kicks, flutters, swishes, rolls, and jabs. You should feel at least 10 movements during 2 hours. °4. You may stop counting after you have felt 10 movements. °5. If you do not feel 10 movements in 2 hours, have something to eat and drink. Then, keep resting and counting for 1 hour. If you feel at least 4 movements during that hour, you may stop counting. °Contact a health care  provider if: °· You feel fewer than 4 movements in 2 hours. °· Your baby is not moving like he or she usually does. °Date: ____________ Start time: ____________ Stop time: ____________ Movements: ____________ °Date: ____________ Start time: ____________ Stop time: ____________ Movements: ____________ °Date: ____________ Start time: ____________ Stop time: ____________ Movements: ____________ °Date: ____________ Start time: ____________ Stop time: ____________ Movements: ____________ °Date: ____________ Start time: ____________ Stop time: ____________ Movements: ____________ °Date: ____________ Start time: ____________ Stop time: ____________ Movements: ____________ °Date: ____________ Start time: ____________ Stop time: ____________ Movements: ____________ °Date: ____________ Start time: ____________ Stop time: ____________ Movements: ____________ °Date: ____________ Start time: ____________ Stop time: ____________ Movements: ____________ °This information is not intended to replace advice given to you by your health care provider. Make sure you discuss any questions you have with your health care provider. °Document Released: 10/22/2006 Document Revised: 05/21/2016 Document Reviewed: 11/01/2015 °Elsevier Interactive Patient Education © 2018 [employer] ° °

## 2018-05-13 NOTE — MAU Note (Signed)
I have communicated with Dr. Constance Goltzlson and reviewed vital signs:  Vitals:   05/13/18 0428  BP: 120/72  Pulse: 91  Resp: 18  Temp: 98.8 F (37.1 C)  SpO2: 99%    Vaginal exam:  Dilation: 1.5 Effacement (%): 50 Cervical Position: Middle Station: -3 Presentation: Vertex Exam by:: Camelia Enganielle Andelyn Spade RN,   Also reviewed contraction pattern and that non-stress test is reactive.  It has been documented that patient is contracting every 3-5 minutes with no cervical change over 1 hour not indicating active labor.  Patient denies any other complaints.  Based on this report provider has given order for discharge.  A discharge order and diagnosis entered by a provider.   Labor discharge instructions reviewed with patient.

## 2018-05-17 ENCOUNTER — Inpatient Hospital Stay (HOSPITAL_COMMUNITY): Payer: Medicaid Other | Admitting: Anesthesiology

## 2018-05-17 ENCOUNTER — Inpatient Hospital Stay (HOSPITAL_COMMUNITY)
Admission: AD | Admit: 2018-05-17 | Discharge: 2018-05-18 | DRG: 798 | Disposition: A | Discharge status: Home / Self Care | Payer: Medicaid Other | Attending: Obstetrics and Gynecology | Admitting: Obstetrics and Gynecology

## 2018-05-17 ENCOUNTER — Encounter (HOSPITAL_COMMUNITY): Payer: Self-pay | Admitting: *Deleted

## 2018-05-17 ENCOUNTER — Encounter (HOSPITAL_COMMUNITY)
Admission: AD | Disposition: A | Discharge status: Home / Self Care | Payer: Self-pay | Source: Home / Self Care | Attending: Obstetrics and Gynecology

## 2018-05-17 DIAGNOSIS — O0993 Supervision of high risk pregnancy, unspecified, third trimester: Secondary | ICD-10-CM

## 2018-05-17 DIAGNOSIS — Z3009 Encounter for other general counseling and advice on contraception: Secondary | ICD-10-CM

## 2018-05-17 DIAGNOSIS — Z302 Encounter for sterilization: Secondary | ICD-10-CM

## 2018-05-17 DIAGNOSIS — O34219 Maternal care for unspecified type scar from previous cesarean delivery: Secondary | ICD-10-CM | POA: Diagnosis not present

## 2018-05-17 DIAGNOSIS — Z3A40 40 weeks gestation of pregnancy: Secondary | ICD-10-CM | POA: Diagnosis not present

## 2018-05-17 DIAGNOSIS — O4292 Full-term premature rupture of membranes, unspecified as to length of time between rupture and onset of labor: Principal | ICD-10-CM | POA: Diagnosis present

## 2018-05-17 DIAGNOSIS — O4290 Premature rupture of membranes, unspecified as to length of time between rupture and onset of labor, unspecified weeks of gestation: Secondary | ICD-10-CM | POA: Diagnosis present

## 2018-05-17 HISTORY — PX: TUBAL LIGATION: SHX77

## 2018-05-17 LAB — TSH: TSH: 1.046 u[IU]/mL (ref 0.350–4.500)

## 2018-05-17 LAB — CBC
HEMATOCRIT: 31.3 % — AB (ref 36.0–46.0)
HEMOGLOBIN: 10.6 g/dL — AB (ref 12.0–15.0)
MCH: 28.3 pg (ref 26.0–34.0)
MCHC: 33.9 g/dL (ref 30.0–36.0)
MCV: 83.5 fL (ref 78.0–100.0)
PLATELETS: 209 10*3/uL (ref 150–400)
RBC: 3.75 MIL/uL — AB (ref 3.87–5.11)
RDW: 15.7 % — ABNORMAL HIGH (ref 11.5–15.5)
WBC: 13.9 10*3/uL — AB (ref 4.0–10.5)

## 2018-05-17 LAB — TYPE AND SCREEN
ABO/RH(D): A POS
ANTIBODY SCREEN: NEGATIVE

## 2018-05-17 LAB — POCT FERN TEST
POCT FERN TEST: NEGATIVE
POCT Fern Test: NEGATIVE

## 2018-05-17 LAB — T4, FREE: FREE T4: 0.96 ng/dL (ref 0.82–1.77)

## 2018-05-17 LAB — AMNISURE RUPTURE OF MEMBRANE (ROM) NOT AT ARMC: Amnisure ROM: POSITIVE

## 2018-05-17 SURGERY — LIGATION, FALLOPIAN TUBE, POSTPARTUM
Anesthesia: Choice

## 2018-05-17 MED ORDER — BUPIVACAINE HCL (PF) 0.25 % IJ SOLN
INTRAMUSCULAR | Status: DC | PRN
  Administered 2018-05-17: 10 mL

## 2018-05-17 MED ORDER — FENTANYL CITRATE (PF) 100 MCG/2ML IJ SOLN
INTRAMUSCULAR | Status: DC | PRN
  Administered 2018-05-17: 100 ug via INTRAVENOUS

## 2018-05-17 MED ORDER — SENNOSIDES-DOCUSATE SODIUM 8.6-50 MG PO TABS
2.0000 | ORAL_TABLET | ORAL | Status: DC
  Administered 2018-05-18: 2 via ORAL
  Filled 2018-05-17: qty 2

## 2018-05-17 MED ORDER — TERBUTALINE SULFATE 1 MG/ML IJ SOLN: 0.2500 mg | Freq: Once | INTRAMUSCULAR | Status: DC | PRN

## 2018-05-17 MED ORDER — IBUPROFEN 600 MG PO TABS
600.0000 mg | ORAL_TABLET | Freq: Four times a day (QID) | ORAL | Status: DC
  Administered 2018-05-18 (×4): 600 mg via ORAL
  Filled 2018-05-17 (×4): qty 1

## 2018-05-17 MED ORDER — LACTATED RINGERS IV SOLN: INTRAVENOUS | Status: DC

## 2018-05-17 MED ORDER — ONDANSETRON HCL 4 MG/2ML IJ SOLN
4.0000 mg | Freq: Four times a day (QID) | INTRAMUSCULAR | Status: DC | PRN
  Administered 2018-05-17: 4 mg via INTRAVENOUS
  Filled 2018-05-17: qty 2

## 2018-05-17 MED ORDER — DIPHENHYDRAMINE HCL 50 MG/ML IJ SOLN: 12.5000 mg | INTRAMUSCULAR | Status: DC | PRN

## 2018-05-17 MED ORDER — SOD CITRATE-CITRIC ACID 500-334 MG/5ML PO SOLN
30.0000 mL | ORAL | Status: DC | PRN
  Administered 2018-05-17: 30 mL via ORAL
  Filled 2018-05-17: qty 15

## 2018-05-17 MED ORDER — HYDROMORPHONE HCL 1 MG/ML IJ SOLN: 1.0000 mg | INTRAMUSCULAR | Status: DC | PRN

## 2018-05-17 MED ORDER — EPHEDRINE 5 MG/ML INJ: 10.0000 mg | INTRAVENOUS | Status: DC | PRN

## 2018-05-17 MED ORDER — BUPIVACAINE HCL (PF) 0.25 % IJ SOLN
INTRAMUSCULAR | Status: AC
  Filled 2018-05-17: qty 30

## 2018-05-17 MED ORDER — FLEET ENEMA 7-19 GM/118ML RE ENEM: 1.0000 | ENEMA | RECTAL | Status: DC | PRN

## 2018-05-17 MED ORDER — ACETAMINOPHEN 325 MG PO TABS: 650.0000 mg | ORAL_TABLET | ORAL | Status: DC | PRN

## 2018-05-17 MED ORDER — PRENATAL MULTIVITAMIN CH: 1.0000 | ORAL_TABLET | Freq: Every day | ORAL | Status: DC

## 2018-05-17 MED ORDER — TETANUS-DIPHTH-ACELL PERTUSSIS 5-2.5-18.5 LF-MCG/0.5 IM SUSP: 0.5000 mL | Freq: Once | INTRAMUSCULAR | Status: DC

## 2018-05-17 MED ORDER — DIBUCAINE 1 % RE OINT: 1.0000 "application " | TOPICAL_OINTMENT | RECTAL | Status: DC | PRN

## 2018-05-17 MED ORDER — BENZOCAINE-MENTHOL 20-0.5 % EX AERO: 1.0000 "application " | INHALATION_SPRAY | CUTANEOUS | Status: DC | PRN

## 2018-05-17 MED ORDER — OXYCODONE-ACETAMINOPHEN 5-325 MG PO TABS: 2.0000 | ORAL_TABLET | ORAL | Status: DC | PRN

## 2018-05-17 MED ORDER — FENTANYL CITRATE (PF) 100 MCG/2ML IJ SOLN
INTRAMUSCULAR | Status: AC
  Filled 2018-05-17: qty 2

## 2018-05-17 MED ORDER — ZOLPIDEM TARTRATE 5 MG PO TABS: 5.0000 mg | ORAL_TABLET | Freq: Every evening | ORAL | Status: DC | PRN

## 2018-05-17 MED ORDER — ONDANSETRON HCL 4 MG/2ML IJ SOLN: 4.0000 mg | INTRAMUSCULAR | Status: DC | PRN

## 2018-05-17 MED ORDER — SODIUM BICARBONATE 8.4 % IV SOLN
INTRAVENOUS | Status: AC
  Filled 2018-05-17: qty 50

## 2018-05-17 MED ORDER — ONDANSETRON HCL 4 MG/2ML IJ SOLN
INTRAMUSCULAR | Status: AC
  Filled 2018-05-17: qty 2

## 2018-05-17 MED ORDER — WITCH HAZEL-GLYCERIN EX PADS: 1.0000 "application " | MEDICATED_PAD | CUTANEOUS | Status: DC | PRN

## 2018-05-17 MED ORDER — LACTATED RINGERS IV SOLN: 500.0000 mL | INTRAVENOUS | Status: DC | PRN

## 2018-05-17 MED ORDER — KETOROLAC TROMETHAMINE 30 MG/ML IJ SOLN
INTRAMUSCULAR | Status: DC | PRN
  Administered 2018-05-17: 30 mg via INTRAVENOUS

## 2018-05-17 MED ORDER — LIDOCAINE HCL (PF) 1 % IJ SOLN
INTRAMUSCULAR | Status: DC | PRN
  Administered 2018-05-17 (×2): 5 mL via EPIDURAL

## 2018-05-17 MED ORDER — MEASLES, MUMPS & RUBELLA VAC ~~LOC~~ INJ: 0.5000 mL | INJECTION | Freq: Once | SUBCUTANEOUS | Status: DC

## 2018-05-17 MED ORDER — OXYCODONE-ACETAMINOPHEN 5-325 MG PO TABS: 1.0000 | ORAL_TABLET | ORAL | Status: DC | PRN

## 2018-05-17 MED ORDER — KETOROLAC TROMETHAMINE 30 MG/ML IJ SOLN
INTRAMUSCULAR | Status: AC
  Filled 2018-05-17: qty 1

## 2018-05-17 MED ORDER — PRENATAL MULTIVITAMIN CH
1.0000 | ORAL_TABLET | Freq: Every day | ORAL | Status: DC
  Administered 2018-05-18: 1 via ORAL
  Filled 2018-05-17: qty 1

## 2018-05-17 MED ORDER — PROPOFOL 10 MG/ML IV BOLUS
INTRAVENOUS | Status: AC
  Filled 2018-05-17: qty 20

## 2018-05-17 MED ORDER — LACTATED RINGERS IV SOLN
INTRAVENOUS | Status: DC | PRN
  Administered 2018-05-17: 18:00:00 via INTRAVENOUS

## 2018-05-17 MED ORDER — PHENYLEPHRINE 40 MCG/ML (10ML) SYRINGE FOR IV PUSH (FOR BLOOD PRESSURE SUPPORT): 80.0000 ug | PREFILLED_SYRINGE | INTRAVENOUS | Status: DC | PRN

## 2018-05-17 MED ORDER — SODIUM BICARBONATE 8.4 % IV SOLN
INTRAVENOUS | Status: DC | PRN
  Administered 2018-05-17 (×3): 5 mL via EPIDURAL

## 2018-05-17 MED ORDER — LACTATED RINGERS IV SOLN: 500.0000 mL | Freq: Once | INTRAVENOUS | Status: DC

## 2018-05-17 MED ORDER — LIDOCAINE HCL (PF) 1 % IJ SOLN
30.0000 mL | INTRAMUSCULAR | Status: DC | PRN
  Filled 2018-05-17: qty 30

## 2018-05-17 MED ORDER — DIPHENHYDRAMINE HCL 25 MG PO CAPS: 25.0000 mg | ORAL_CAPSULE | Freq: Four times a day (QID) | ORAL | Status: DC | PRN

## 2018-05-17 MED ORDER — OXYTOCIN 40 UNITS IN LACTATED RINGERS INFUSION - SIMPLE MED: 2.5000 [IU]/h | INTRAVENOUS | Status: DC

## 2018-05-17 MED ORDER — OXYTOCIN BOLUS FROM INFUSION
500.0000 mL | Freq: Once | INTRAVENOUS | Status: AC
  Administered 2018-05-17: 500 mL via INTRAVENOUS

## 2018-05-17 MED ORDER — LIDOCAINE-EPINEPHRINE (PF) 2 %-1:200000 IJ SOLN
INTRAMUSCULAR | Status: AC
  Filled 2018-05-17: qty 20

## 2018-05-17 MED ORDER — LACTATED RINGERS IV SOLN
INTRAVENOUS | Status: DC
  Administered 2018-05-17 (×2): via INTRAVENOUS

## 2018-05-17 MED ORDER — LACTATED RINGERS IV SOLN
INTRAVENOUS | Status: DC | PRN
  Administered 2018-05-17 (×3): via INTRAVENOUS

## 2018-05-17 MED ORDER — ONDANSETRON HCL 4 MG PO TABS: 4.0000 mg | ORAL_TABLET | ORAL | Status: DC | PRN

## 2018-05-17 MED ORDER — SIMETHICONE 80 MG PO CHEW: 80.0000 mg | CHEWABLE_TABLET | ORAL | Status: DC | PRN

## 2018-05-17 MED ORDER — ONDANSETRON HCL 4 MG/2ML IJ SOLN
INTRAMUSCULAR | Status: DC | PRN
  Administered 2018-05-17: 4 mg via INTRAVENOUS

## 2018-05-17 MED ORDER — COCONUT OIL OIL: 1.0000 "application " | TOPICAL_OIL | Status: DC | PRN

## 2018-05-17 MED ORDER — IBUPROFEN 800 MG PO TABS: 800.0000 mg | ORAL_TABLET | Freq: Three times a day (TID) | ORAL | Status: DC

## 2018-05-17 MED ORDER — OXYTOCIN 40 UNITS IN LACTATED RINGERS INFUSION - SIMPLE MED
1.0000 m[IU]/min | INTRAVENOUS | Status: DC
  Administered 2018-05-17: 2 m[IU]/min via INTRAVENOUS
  Filled 2018-05-17: qty 1000

## 2018-05-17 MED ORDER — PROPOFOL 10 MG/ML IV BOLUS
INTRAVENOUS | Status: DC | PRN
  Administered 2018-05-17 (×3): 20 mg via INTRAVENOUS

## 2018-05-17 MED ORDER — SENNOSIDES-DOCUSATE SODIUM 8.6-50 MG PO TABS: 2.0000 | ORAL_TABLET | ORAL | Status: DC

## 2018-05-17 MED ORDER — ACETAMINOPHEN 325 MG PO TABS
650.0000 mg | ORAL_TABLET | ORAL | Status: DC | PRN
  Administered 2018-05-18: 650 mg via ORAL
  Filled 2018-05-17: qty 2

## 2018-05-17 MED ORDER — PHENYLEPHRINE 40 MCG/ML (10ML) SYRINGE FOR IV PUSH (FOR BLOOD PRESSURE SUPPORT)
80.0000 ug | PREFILLED_SYRINGE | INTRAVENOUS | Status: DC | PRN
  Filled 2018-05-17: qty 10

## 2018-05-17 MED ORDER — FENTANYL 2.5 MCG/ML BUPIVACAINE 1/10 % EPIDURAL INFUSION (WH - ANES)
14.0000 mL/h | INTRAMUSCULAR | Status: DC | PRN
  Administered 2018-05-17: 14 mL/h via EPIDURAL
  Filled 2018-05-17: qty 100

## 2018-05-17 SURGICAL SUPPLY — 24 items
CLIP FILSHIE TUBAL LIGA STRL (Clip) ×3 IMPLANT
CLOTH BEACON ORANGE TIMEOUT ST (SAFETY) ×3 IMPLANT
DERMABOND ADVANCED (GAUZE/BANDAGES/DRESSINGS) ×2
DERMABOND ADVANCED .7 DNX12 (GAUZE/BANDAGES/DRESSINGS) ×1 IMPLANT
DRSG OPSITE POSTOP 3X4 (GAUZE/BANDAGES/DRESSINGS) ×3 IMPLANT
DURAPREP 26ML APPLICATOR (WOUND CARE) ×3 IMPLANT
GLOVE BIO SURGEON STRL SZ7.5 (GLOVE) ×3 IMPLANT
GLOVE BIOGEL PI IND STRL 7.0 (GLOVE) ×2 IMPLANT
GLOVE BIOGEL PI INDICATOR 7.0 (GLOVE) ×4
GOWN STRL REUS W/TWL LRG LVL3 (GOWN DISPOSABLE) ×3 IMPLANT
GOWN STRL REUS W/TWL XL LVL3 (GOWN DISPOSABLE) ×3 IMPLANT
NEEDLE HYPO 22GX1.5 SAFETY (NEEDLE) ×3 IMPLANT
NS IRRIG 1000ML POUR BTL (IV SOLUTION) ×3 IMPLANT
PACK ABDOMINAL MINOR (CUSTOM PROCEDURE TRAY) ×3 IMPLANT
PROTECTOR NERVE ULNAR (MISCELLANEOUS) ×3 IMPLANT
SPONGE LAP 4X18 X RAY DECT (DISPOSABLE) IMPLANT
SUT MNCRL AB 4-0 PS2 18 (SUTURE) ×3 IMPLANT
SUT PLAIN 0 NONE (SUTURE) ×3 IMPLANT
SUT VIC AB 0 CT1 27 (SUTURE) ×2
SUT VIC AB 0 CT1 27XBRD ANBCTR (SUTURE) ×1 IMPLANT
SYR CONTROL 10ML LL (SYRINGE) ×3 IMPLANT
TOWEL OR 17X24 6PK STRL BLUE (TOWEL DISPOSABLE) ×6 IMPLANT
TRAY FOLEY CATH SILVER 14FR (SET/KITS/TRAYS/PACK) ×3 IMPLANT
WATER STERILE IRR 1000ML POUR (IV SOLUTION) ×3 IMPLANT

## 2018-05-17 NOTE — H&P (Addendum)
OBSTETRIC ADMISSION HISTORY AND PHYSICAL  Nicole Love is a 27 y.o. female [phone] with IUP at [redacted]w[redacted]d by LMP presenting for PROM. LOF occurred at 8am yesterday and was clear fluid, but now she states it is more yellowish in appearance. She states her contractions are 5-10 minutes apart. She rates them at a 7/10 in intensity.  She has been diagnosed with hyperthyroidism but recent labs have been normal and she is not currently taking medicine.  She states she had a one time event about a month ago where she had blurriness in her right eye and numbness in her right arm.   She reports +FMs, peripheral edema, or RUQ pain.  She plans on breast and bottle feeding. She request BTL for birth control. She received her prenatal care at  [facility]:    @[redacted]w[redacted]d , CWD, normal anatomy, cephalic presentation, 3443g, 45% EFW   Prenatal History/Complications: hyperthyroid (euthyroid currently)  Past Medical History: Past Medical History:  Diagnosis Date  . Hyperthyroidism   . Pregnancy     Past Surgical History: Past Surgical History:  Procedure Laterality Date  . CESAREAN SECTION      Obstetrical History: OB History    Gravida  5   Para  3   Term  3   Preterm      AB  1   Living  3     SAB  1   TAB      Ectopic      Multiple      Live Births  3           Social History: Social History   Socioeconomic History  . Marital status: Significant Other    Spouse name: Not on file  . Number of children: Not on file  . Years of education: Not on file  . Highest education level: Not on file  Occupational History  . Not on file  Social Needs  . Financial resource strain: Not hard at all  . Food insecurity:    Worry: Never true    Inability: Never true  . Transportation needs:    Medical: No    Non-medical: No  Tobacco Use  . Smoking status: Never Smoker  . Smokeless tobacco: Never Used  Substance and Sexual Activity  . Alcohol use: No  . Drug use: No  . Sexual  activity: Yes    Birth control/protection: None  Lifestyle  . Physical activity:    Days per week: 3 days    Minutes per session: 20 min  . Stress: Only a little  Relationships  . Social connections:    Talks on phone: Three times a week    Gets together: Twice a week    Attends religious service: Never    Active member of club or organization: No    Attends meetings of clubs or organizations: Never    Relationship status: Living with partner  Other Topics Concern  . Not on file  Social History Narrative  . Not on file    Family History: Family History  Problem Relation Age of Onset  . Diabetes Maternal Grandmother     Allergies: No Known Allergies  Medications Prior to Admission  Medication Sig Dispense Refill Last Dose  . ferrous gluconate (FERGON) 324 MG tablet Take 1 tablet (324 mg total) by mouth 2 (two) times daily with a meal. (Patient taking differently: Take 324 mg by mouth every other day. ) 60 tablet 3 Past Week at Unknown  time  . ondansetron (ZOFRAN-ODT) 8 MG disintegrating tablet Take 1 tablet (8 mg total) by mouth every 8 (eight) hours as needed for nausea or vomiting. 30 tablet 0 05/16/2018 at Unknown time  . Prenatal Vit-Fe Fumarate-FA (PREPLUS) 27-1 MG TABS Take 1 tablet by mouth daily. 30 tablet 9 05/16/2018 at Unknown time  . Butalbital-APAP-Caffeine 50-325-40 MG capsule Take 1 capsule by mouth every 4 (four) hours as needed for headache. 30 capsule 3 prn     Review of Systems   All systems reviewed and negative except as stated in HPI  Blood pressure 107/63, pulse 86, temperature 97.9 F (36.6 C), temperature source Oral, resp. rate 18, weight 70.3 kg, last menstrual period 08/08/2017, SpO2 99 %, not currently breastfeeding. General appearance: alert, cooperative, appears stated age and no distress Lungs: clear to auscultation bilaterally Heart: regular rate and rhythm Abdomen: soft, non-tender; bowel sounds normal Extremities: Homans sign is  negative, no sign of DVT Presentation: cephalic Fetal monitoringBaseline: 130 bpm, Variability: Good {> 6 bpm), Accelerations: Reactive and Decelerations: Absent Uterine activityDate/time of onset: this morning, Frequency: Every 5-10 minutes and Intensity: moderate Dilation: 4.5 Exam by:: Raelyn Moraolitta Dawson CNM   Prenatal labs: ABO, Rh: --/--/A POS (08/12 1120) Antibody: NEG (08/12 1120) Rubella: 5.26 (01/17 1219) RPR: Non Reactive (05/06 1447)  HBsAg: Negative (01/17 1219)  HIV: Non Reactive (05/06 1447)  GBS: Negative (07/25 0000)  1 hr Glucola wnl Genetic screening  afp negative. NIPS low risk Anatomy US normal other than echogenic focus.   Prenatal Transfer Tool  Maternal Diabetes: No Genetic Screening: Normal Maternal Ultrasounds/Referrals: Normal Fetal Ultrasounds or other Referrals:  None Maternal Substance Abuse:  No Significant Maternal Medications:  Iron, zofran Significant Maternal Lab Results: Lab values include: Group B Strep negative  Results for orders placed or performed during the hospital encounter of 05/17/18 (from the past 24 hour(s))  POCT fern test   Collection Time: 05/17/18  8:21 AM  Result Value Ref Range   POCT Fern Test Negative = intact amniotic membranes   [facility] rupture [facility] membrane (rom[facility]   Collection Time: 05/17/18  8:48 AM  Result Value Ref Range   Amnisure ROM POSITIVE   Fern Test   Collection Time: 05/17/18  9:04 AM  Result Value Ref Range   POCT Fern Test Negative = intact amniotic membranes   CBC   Collection Time: 05/17/18 10:38 AM  Result Value Ref Range   WBC 13.9 (H) 4.0 - 10.5 K/uL   RBC 3.75 (L) 3.87 - 5.11 MIL/uL   Hemoglobin 10.6 (L) 12.0 - 15.0 g/dL   HCT 16.131.3 (L) 09.636.0 - 04.546.0 %   MCV 83.5 78.0 - 100.0 fL   MCH 28.3 26.0 - 34.0 pg   MCHC 33.9 30.0 - 36.0 g/dL   RDW 40.915.7 (H) 81.111.5 - 91.415.5 %   Platelets 209 150 - 400 K/uL  TSH   Collection Time: 05/17/18 11:20 AM  Result Value Ref Range   TSH 1.046 0.350 - 4.500  uIU/mL  Type and screen   Collection Time: 05/17/18 11:20 AM  Result Value Ref Range   ABO/RH(D) A POS    Antibody Screen NEG    Sample Expiration      05/20/2018 Performed at [facility], [address] [place], [place] [id number]     Patient Active Problem List   Diagnosis Date Noted  . Amniotic fluid leaking 05/17/2018  . Normal labor 05/17/2018  . Unwanted fertility 05/10/2018  . Thyroid disease affecting  pregnancy   . Previous cesarean delivery, antepartum 12/16/2017  . Urinary tract infection 10/26/2017  . Supervision of high risk pregnancy, antepartum, third trimester 10/22/2017  . Hyperthyroidism 10/22/2017    Assessment/Plan:  Carola RhineHannah L Lezcano is a 27 y.o. G5P3013 at 732w2d here for PROM.   #Labor:patient at 4-5 cm.  Will start with pitocin if necessary. #hyperthyroid: euthyroid and not on medicine.  Will get TSH and T3/4 labs.  #Pain: Epidural upon maternal request.  #FWB: Cat. I.  #ID:  GBS neg #MOF: both #MOC:BTL #Circ:  n/a  Sandre Kittyaniel K Olson, MD  05/17/2018, 12:46 PM   OB FELLOW HISTORY AND PHYSICAL ATTESTATION  I have seen and examined this patient; I agree with above documentation in the resident's note.   Carola RhineHannah L Happ is 27 y.o. [id number] at 6532w2d who presents for SROM >24 hours prior with clear fluid. Patient with h/o hyperthyroidism not on medications; will check thyroid studies. Given prolonged ROM with minimal cervical change, will start augmentation with Pitocin. Epidural is now in place. Anticipate NSVD.   Marcy Sirenatherine Wallace, D.O. OB Fellow  05/17/2018, 2:03 PM

## 2018-05-17 NOTE — Anesthesia Procedure Notes (Addendum)
Epidural Patient location during procedure: OB Start time: 05/17/2018 12:00 PM End time: 05/17/2018 3:40 PM  Staffing Anesthesiologist: Phillips Groutarignan, Caral Whan, MD Performed: anesthesiologist   Preanesthetic Checklist Completed: patient identified, site marked, surgical consent, pre-op evaluation, timeout performed, IV checked, risks and benefits discussed and monitors and equipment checked  Epidural Patient position: sitting Prep: DuraPrep Patient monitoring: heart rate, continuous pulse ox and blood pressure Approach: midline Location: L3-L4 Injection technique: LOR saline  Needle:  Needle type: Tuohy  Needle gauge: 17 G Needle length: 9 cm and 9 Needle insertion depth: 6 cm Catheter type: closed end flexible Catheter size: 20 Guage Catheter at skin depth: 10 cm Test dose: negative  Assessment Events: blood not aspirated, injection not painful, no injection resistance, negative IV test and no paresthesia  Additional Notes Patient identified. Risks/Benefits/Options discussed with patient including but not limited to bleeding, infection, nerve damage, paralysis, failed block, incomplete pain control, headache, blood pressure changes, nausea, vomiting, reactions to medication both or allergic, itching and postpartum back pain. Confirmed with bedside nurse the patient's most recent platelet count. Confirmed with patient that they are not currently taking any anticoagulation, have any bleeding history or any family history of bleeding disorders. Patient expressed understanding and wished to proceed. All questions were answered. Sterile technique was used throughout the entire procedure. Please see nursing notes for vital signs. Test dose was given through epidural needle and negative prior to continuing to dose epidural or start infusion. Warning signs of high block given to the patient including shortness of breath, tingling/numbness in hands, complete motor block, or any concerning symptoms  with instructions to call for help. Patient was given instructions on fall risk and not to get out of bed. All questions and concerns addressed with instructions to call with any issues.

## 2018-05-17 NOTE — Progress Notes (Signed)
Patient states she thought her water might have broken on Sunday at 10 am, she felt a "gush" and some leaking throughout the day.  Pt states she has a UTI and not sure if the discharge is from that or amniotic fluid.  This AM at 0645 she had another "big gush" that appeared yellow and had no odor.  Now having some irregular CTX.

## 2018-05-17 NOTE — Progress Notes (Signed)
Patient ID: Nicole Love, female   DOB: 08/19/1991, 27 y.o.   MRN: [id number] Patient desires bilateral tubal sterilization.  Other reversible forms of contraception were discussed with patient; she declines all other modalities. Discussed bilateral tubal sterilization in detail; discussed options of laparoscopic bilateral tubal sterilization using Filshie clips vs laparoscopic bilateral salpingectomy. Risks and benefits discussed in detail including but not limited to: risk of regret, permanence of method, bleeding, infection, injury to surrounding organs and need for additional procedures.  Failure risk of 1-2 % for Filshie clips and <1% for bilateral salpingectomy with increased risk of ectopic gestation if pregnancy occurs was also discussed with patient.  Also discussed possible reduction of risk of ovarian cancer via bilateral salpingectomy given that a growing body of knowledge reveals that the majority of cases of high grade serous "ovarian" cancer actually are actually  cancers arising from the fimbriated end of the fallopian tubes. Emphasized that removal of fallopian tubes do not result in any known hormonal imbalance.  Patient verbalized understanding of these risks and benefits and wants to proceed with sterilization with laparoscopic bilateral sterilization using Filshie clips.   Medicaid papers have been signed over 30 days.

## 2018-05-17 NOTE — Transfer of Care (Signed)
Immediate Anesthesia Transfer of Care Note  Patient: Nicole Love  Procedure(s) Performed: POST PARTUM TUBAL LIGATION (N/A )  Patient Location: PACU  Anesthesia Type:Epidural  Level of Consciousness: awake and alert   Airway & Oxygen Therapy: Patient Spontanous Breathing  Post-op Assessment: Report given to RN and Post -op Vital signs reviewed and stable  Post vital signs: Reviewed  Last Vitals:  Vitals Value Taken Time  BP    Temp    Pulse 88 05/17/2018  6:49 PM  Resp 16 05/17/2018  6:49 PM  SpO2 100 % 05/17/2018  6:49 PM  Vitals shown include unvalidated device data.  Last Pain:  Vitals:   05/17/18 1731  TempSrc:   PainSc: 0-No pain         Complications: No apparent anesthesia complications

## 2018-05-17 NOTE — Anesthesia Postprocedure Evaluation (Signed)
Anesthesia Post Note  Patient: Nicole FloroHannah L Wessinger  Procedure(s) Performed: POST PARTUM TUBAL LIGATION (N/A )     Patient location during evaluation: Mother Baby Anesthesia Type: Epidural Level of consciousness: awake and alert Pain management: pain level controlled Vital Signs Assessment: post-procedure vital signs reviewed and stable Respiratory status: spontaneous breathing, nonlabored ventilation and respiratory function stable Cardiovascular status: stable Postop Assessment: no headache, no backache and epidural receding Anesthetic complications: no    Last Vitals:  Vitals:   05/17/18 2015 05/17/18 2030  BP: 123/84 123/84  Pulse: 74 77  Resp: 14 18  Temp:  36.6 C  SpO2: 100% 100%    Last Pain:  Vitals:   05/17/18 2030  TempSrc: Axillary  PainSc: 0-No pain   Pain Goal:                 Lowella CurbWarren Ray Clayton Jarmon

## 2018-05-17 NOTE — MAU Provider Note (Addendum)
History     CSN: [id number]  Arrival date and time: 05/17/18 [id number]   First Provider Initiated Contact with Patient 05/17/18 [phone]      Chief Complaint  Patient presents with  . Labor Eval   HPI  Ms.  Nicole Love is a 27 y.o. year old [id number] female at 495w2d weeks gestation who presents to MAU reporting that her water may have broken on Sunday 05/16/2018 @ 1000. She felt a "gush" and continued to leak throughout the day. She reports she has a UTI and not sure if the vaginal d/c is from the UTI or her water being broken. She reports that this AM at 0645 she had another "gush" of yellowish fluid with no urine smell. She also complains of irregular contractions/abominal pain.   Past Medical History:  Diagnosis Date  . Hyperthyroidism   . Pregnancy     Past Surgical History:  Procedure Laterality Date  . CESAREAN SECTION      Family History  Problem Relation Age of Onset  . Diabetes Maternal Grandmother     Social History   Tobacco Use  . Smoking status: Never Smoker  . Smokeless tobacco: Never Used  Substance Use Topics  . Alcohol use: No  . Drug use: No    Allergies: No Known Allergies  Medications Prior to Admission  Medication Sig Dispense Refill Last Dose  . Butalbital-APAP-Caffeine 50-325-40 MG capsule Take 1 capsule by mouth every 4 (four) hours as needed for headache. 30 capsule 3 Taking  . ferrous gluconate (FERGON) 324 MG tablet Take 1 tablet (324 mg total) by mouth 2 (two) times daily with a meal. 60 tablet 3 Taking  . ondansetron (ZOFRAN-ODT) 8 MG disintegrating tablet Take 1 tablet (8 mg total) by mouth every 8 (eight) hours as needed for nausea or vomiting. 30 tablet 0 Taking  . Prenatal Vit-Fe Fumarate-FA (PREPLUS) 27-1 MG TABS Take 1 tablet by mouth daily. 30 tablet 9 Taking    Review of Systems  Constitutional: Negative.   HENT: Negative.   Eyes: Negative.   Respiratory: Negative.   Cardiovascular: Negative.   Gastrointestinal: Positive for  abdominal pain.  Endocrine: Negative.   Genitourinary: Positive for pelvic pain and vaginal discharge.  Musculoskeletal: Negative.   Skin: Negative.   Allergic/Immunologic: Negative.   Neurological: Negative.   Hematological: Negative.   Psychiatric/Behavioral: Negative.    Physical Exam   Blood pressure 110/80, pulse 90, temperature 97.7 F (36.5 C), temperature source Oral, resp. rate 16, weight 70.3 kg, last menstrual period 08/08/2017, SpO2 100 %, not currently breastfeeding.  Physical Exam  Nursing note and vitals reviewed. Constitutional: She is oriented to person, place, and time. She appears well-developed and well-nourished.  HENT:  Head: Normocephalic and atraumatic.  Eyes: Pupils are equal, round, and reactive to light.  Neck: Normal range of motion.  Cardiovascular: Normal rate and regular rhythm.  Respiratory: Effort normal.  GI: Soft.  Genitourinary:  Genitourinary Comments: SSE: copious amounts of clear mucoid fluid in vaginal vault // fern slide & Amnisure obtained // VE: 4.5/60%/Vertex/BBOW  Musculoskeletal: Normal range of motion.  Neurological: She is alert and oriented to person, place, and time.  Skin: Skin is warm and dry.  Psychiatric: She has a normal mood and affect. Her behavior is normal. Judgment and thought content normal.    MAU Course  Procedures  MDM Fern slide Amnisure  NST - FHR: 135 bpm / moderate variability / accels present / decels absent / TOCO: regular every 2-4  mins   Results for orders placed or performed during the hospital encounter of 05/17/18 (from the past 24 hour(s))  POCT fern test     Status: None   Collection Time: 05/17/18  8:21 AM  Result Value Ref Range   POCT Fern Test Negative = intact amniotic membranes   Amnisure rupture [facility] membrane (rom)[facility]     Status: None   Collection Time: 05/17/18  8:48 AM  Result Value Ref Range   Amnisure ROM POSITIVE   Fern Test     Status: None   Collection Time: 05/17/18   9:04 AM  Result Value Ref Range   POCT Fern Test Negative = intact amniotic membranes   CBC     Status: Abnormal   Collection Time: 05/17/18 10:38 AM  Result Value Ref Range   WBC 13.9 (H) 4.0 - 10.5 K/uL   RBC 3.75 (L) 3.87 - 5.11 MIL/uL   Hemoglobin 10.6 (L) 12.0 - 15.0 g/dL   HCT 45.431.3 (L) 09.836.0 - 11.946.0 %   MCV 83.5 78.0 - 100.0 fL   MCH 28.3 26.0 - 34.0 pg   MCHC 33.9 30.0 - 36.0 g/dL   RDW 14.715.7 (H) 82.911.5 - 56.215.5 %   Platelets 209 150 - 400 K/uL     Assessment and Plan  Amniotic leaking fluid - Admit to L&D  - Routine L&D orders    Raelyn Moraolitta Haylie Mccutcheon, MSN, CNM 05/17/2018, 8:32 AM

## 2018-05-17 NOTE — Anesthesia Pain Management Evaluation Note (Signed)
  CRNA Pain Management Visit Note  Patient: Nicole Love, 27 y.o., female  "Hello I am a [facility] of the anesthesia [facility] at [facility]. We have an anesthesia team available at all times to provide care throughout the hospital, including epidural management and anesthesia for C-section. I don't know your plan for the delivery whether it a natural birth, water birth, IV sedation, nitrous supplementation, doula or epidural, but we want to meet your pain goals."   1.Was your pain managed to your expectations on prior hospitalizations?   Yes   2.What is your expectation for pain management during this hospitalization?     Epidural  3.How can we help you reach that goal? Epidural intact  Record the patient's initial score and the patient's pain goal.   Pain: 0  Pain Goal: 7 [facility] wants you to be able to say your pain was always managed very well.  Edison PaceWILKERSON,Loukisha Gunnerson 05/17/2018

## 2018-05-17 NOTE — Op Note (Signed)
Nicole ClientHannah L Love 05/17/2018  PREOPERATIVE DIAGNOSES: Multiparity, undesired fertility  POSTOPERATIVE DIAGNOSES: Multiparity, undesired fertility  PROCEDURE:  Postpartum Bilateral Tubal Sterilization using Filshie Clips   SURGEON: Dr. Nettie ElmMichael Skipper Dacosta  ANESTHESIA:  Epidural and local analgesia using 10 ml of 0.5% Marcaine  COMPLICATIONS:  None immediate.  ESTIMATED BLOOD LOSS: 25 ml.  FLUIDS: As recorded  URINE OUTPUT:  As recorded  INDICATIONS:  27 y.o. Nicole Love with undesired fertility,status post vaginal delivery, desires permanent sterilization.  Other reversible forms of contraception were discussed with patient; she declines all other modalities. Risks of procedure discussed with patient including but not limited to: risk of regret, permanence of method, bleeding, infection, injury to surrounding organs and need for additional procedures.  Failure risk of 1 -2 % with increased risk of ectopic gestation if pregnancy occurs was also discussed with patient.      FINDINGS:  Normal uterus, tubes, and ovaries.  PROCEDURE DETAILS: The patient was taken to the operating room where her epidural anesthesia was dosed up to surgical level and found to be adequate.  She was then placed in the dorsal supine position and prepped and draped in sterile fashion.  After an adequate timeout was performed, attention was turned to the patient's abdomen where a small transverse skin incision was made under the umbilical fold. The incision was taken down to the layer of fascia using the scalpel, and fascia was incised, and extended bilaterally using Mayo scissors. The peritoneum was entered in a sharp fashion. Attention was then turned to the patient's uterus, and left fallopian tube was identified and followed out to the fimbriated end.  A Filshie clip was placed on the left fallopian tube about 3 cm from the cornual attachment, with care given to incorporate the underlying mesosalpinx.  A similar process was  carried out on the right side allowing for bilateral tubal sterilization.  Good hemostasis was noted overall.  Local analgesia was injected into both Filshie application sites.The instruments were then removed from the patient's abdomen and the fascial incision was repaired with 0 Vicryl, and the skin was closed with a 4-0 Vicryl subcuticular stitch. The patient tolerated the procedure well.  Instrument, sponge, and needle counts were correct times two.  The patient was then taken to the recovery room awake and in stable condition.   [name], MD, FACOG Attending Obstetrician & Gynecologist Faculty Practice, [facility] [employer]

## 2018-05-17 NOTE — Anesthesia Preprocedure Evaluation (Signed)
Anesthesia Evaluation  Patient identified by MRN, date of birth, ID band Patient awake    Reviewed: Allergy & Precautions, NPO status , Patient's Chart, lab work & pertinent test results  Airway Mallampati: II  TM Distance: >3 FB Neck ROM: Full    Dental no notable dental hx.    Pulmonary neg pulmonary ROS,    Pulmonary exam normal breath sounds clear to auscultation       Cardiovascular negative cardio ROS Normal cardiovascular exam Rhythm:Regular Rate:Normal     Neuro/Psych negative neurological ROS  negative psych ROS   GI/Hepatic negative GI ROS, Neg liver ROS,   Endo/Other  negative endocrine ROS  Renal/GU negative Renal ROS  negative genitourinary   Musculoskeletal negative musculoskeletal ROS (+)   Abdominal   Peds negative pediatric ROS (+)  Hematology negative hematology ROS (+)   Anesthesia Other Findings   Reproductive/Obstetrics (+) Pregnancy                             Anesthesia Physical Anesthesia Plan  ASA: II  Anesthesia Plan: Epidural   Post-op Pain Management:    Induction:   PONV Risk Score and Plan: 0 and Treatment may vary due to age or medical condition  Airway Management Planned: Natural Airway  Additional Equipment:   Intra-op Plan:   Post-operative Plan:   Informed Consent: I have reviewed the patients History and Physical, chart, labs and discussed the procedure including the risks, benefits and alternatives for the proposed anesthesia with the patient or authorized representative who has indicated his/her understanding and acceptance.     Plan Discussed with:   Anesthesia Plan Comments:         Anesthesia Quick Evaluation

## 2018-05-18 ENCOUNTER — Encounter (HOSPITAL_COMMUNITY): Payer: Self-pay

## 2018-05-18 ENCOUNTER — Encounter (HOSPITAL_COMMUNITY): Payer: Self-pay | Admitting: Obstetrics and Gynecology

## 2018-05-18 LAB — CBC
HCT: 26.2 % — ABNORMAL LOW (ref 36.0–46.0)
Hemoglobin: 8.9 g/dL — ABNORMAL LOW (ref 12.0–15.0)
MCH: 28.5 pg (ref 26.0–34.0)
MCHC: 34 g/dL (ref 30.0–36.0)
MCV: 84 fL (ref 78.0–100.0)
PLATELETS: 168 10*3/uL (ref 150–400)
RBC: 3.12 MIL/uL — AB (ref 3.87–5.11)
RDW: 15.8 % — AB (ref 11.5–15.5)
WBC: 13.8 10*3/uL — ABNORMAL HIGH (ref 4.0–10.5)

## 2018-05-18 LAB — RPR: RPR Ser Ql: NONREACTIVE

## 2018-05-18 LAB — T3, FREE: T3 FREE: 3 pg/mL (ref 2.0–4.4)

## 2018-05-18 MED ORDER — IBUPROFEN 600 MG PO TABS: 600.0000 mg | ORAL_TABLET | Freq: Four times a day (QID) | ORAL | 0 refills | Status: DC

## 2018-05-18 MED ORDER — FERROUS SULFATE 325 (65 FE) MG PO TBEC: 325.0000 mg | DELAYED_RELEASE_TABLET | Freq: Two times a day (BID) | ORAL | 0 refills | Status: DC

## 2018-05-18 MED ORDER — OXYCODONE-ACETAMINOPHEN 5-325 MG PO TABS: 1.0000 | ORAL_TABLET | ORAL | 0 refills | Status: DC | PRN

## 2018-05-18 NOTE — Lactation Note (Signed)
This note was copied from a baby's chart. Lactation Consultation Note Baby 11 hrs old. Mom stated baby hasn't fed since birth. Sleepy at breast and wouldn't latch. Baby just had large clear watery fluid [facility].  Educated on STS, I&O, cluster feeding, breast massage, milk storage, supply and demand. Hand expression demonstrated by pt. Collected 3 ml easy flow colostrum. Praised mom. Assisted in football hold. Taught "C" hold, baby latched. Mom denied pain. Discussed support, comfort, positioning, and baby body alignment.  Mom has 3 other children. BF her 1 yr old for 3 months. Had to stop d/t dairy allergy. Mom stated she had a hard time so she changed to a special formula.  Mom encouraged to feed baby 8-12 times/24 hours and with feeding cues. Mom encouraged to waken baby for feeds if hasn't cued in 3 hrs. Noted baby's hands jittery while mom changed diaper. Will report to RN WH/LC brochure given w/resources, support groups and LC services.  Patient Name: Girl Gordan PaymentHannah Snuffer ZOXWR'UToday's Date: 05/18/2018 Reason for consult: Initial assessment   Maternal Data Has patient been taught Hand Expression?: Yes Does the patient have breastfeeding experience prior to this delivery?: Yes  Feeding Feeding Type: Breast Fed Length of feed: 15 min(still BF)  LATCH Score Latch: Grasps breast easily, tongue down, lips flanged, rhythmical sucking.  Audible Swallowing: Spontaneous and intermittent  Type of Nipple: Everted at rest and after stimulation  Comfort (Breast/Nipple): Soft / non-tender  Hold (Positioning): Assistance needed to correctly position infant at breast and maintain latch.  LATCH Score: 9  Interventions Interventions: Breast feeding basics reviewed;Support pillows;Assisted with latch;Position options;Skin to skin;Expressed milk;Breast massage;Hand express;Breast compression;Adjust position  Lactation Tools Discussed/Used WIC Program: Yes   Consult Status Consult  Status: Follow-up Date: 05/19/18 Follow-up type: In-patient    Charyl DancerCARVER, Vaughn Frieze G 05/18/2018, 2:43 AM

## 2018-05-18 NOTE — Lactation Note (Signed)
This note was copied from a baby's chart. Lactation Consultation Note  Patient Name: Nicole Love PaymentHannah Barno IONGE'XToday's Date: 05/18/2018 Reason for consult: Follow-up assessment;Term(per mom unsure if baby is getting enough/questioning swallow)  Baby is 23 hours old, has been to the breast several times 9-35 mins, voided and stools  Prior to latching large wet.  Since it had been 0947 last feeding, LC checked diaper, large wet, and assisted mom to latch  After she did breast massage , hand express, drops noted and latched with breast compression  Instructions until swallows, swallows noted and LC pointed the swallows out to mom . Mom seemed more reassured.  Baby fed for 9 mins and fell asleep. Mom had mentioned the baby was up a good portion of the night and fed more.  Prior to consult baby was STS after bath, and may be relaxed to bath.  Baby STS with mom after feeding.  LC instructed mom on the use of hand pump, and checked the #24 F , good fit.  LC recommended to enhance let down after breast massage , hand express, pre-pump to prirmwe the milk ducts so she will hear more swallows, breast compressions with the latch and with feeding.  Mom did well with instruction and expressed appreciation for assistance.   Mom has a hx of Hyperthyroidism- reports she didn't have a lot of breast changes with any of her kids. With this pregnancy - only remembers the areola darkening.      Maternal Data Has patient been taught Hand Expression?: Yes Does the patient have breastfeeding experience prior to this delivery?: Yes  Feeding Feeding Type: Breast Milk Length of feed: 9 min( swallows noted/ pointed them out to mom , hand exp prior )  LATCH Score Latch: Grasps breast easily, tongue down, lips flanged, rhythmical sucking.(assisted mom and worked on depth )  Audible Swallowing: A few with stimulation  Type of Nipple: Everted at rest and after stimulation  Comfort (Breast/Nipple): Soft /  non-tender  Hold (Positioning): Assistance needed to correctly position infant at breast and maintain latch.  LATCH Score: 8  Interventions Interventions: Breast feeding basics reviewed;Assisted with latch;Skin to skin;Breast massage;Hand express;Reverse pressure;Adjust position;Support pillows;Position options  Lactation Tools Discussed/Used Pump Review: Setup, frequency, and cleaning;Milk Storage Initiated by:: MAI  Date initiated:: 05/18/18   Consult Status Consult Status: Follow-up Date: 05/19/18 Follow-up type: In-patient    Matilde SprangMargaret Ann Tyronne Blann 05/18/2018, 3:30 PM

## 2018-05-18 NOTE — Discharge Instructions (Signed)
Vaginal Delivery, Care After °Refer to this sheet in the next few weeks. These instructions provide you with information about caring for yourself after vaginal delivery. Your health care provider may also give you more specific instructions. Your treatment has been planned according to current medical practices, but problems sometimes occur. Call your health care provider if you have any problems or questions. °What can I expect after the procedure? °After vaginal delivery, it is common to have: °· Some bleeding from your vagina. °· Soreness in your abdomen, your vagina, and the area of skin between your vaginal opening and your anus (perineum). °· Pelvic cramps. °· Fatigue. ° °Follow these instructions at home: °Medicines °· Take over-the-counter and prescription medicines only as told by your health care provider. °· If you were prescribed an antibiotic medicine, take it as told by your health care provider. Do not stop taking the antibiotic until it is finished. °Driving ° °· Do not drive or operate heavy machinery while taking prescription pain medicine. °· Do not drive for 24 hours if you received a sedative. °Lifestyle °· Do not drink alcohol. This is especially important if you are breastfeeding or taking medicine to relieve pain. °· Do not use tobacco products, including cigarettes, chewing tobacco, or e-cigarettes. If you need help quitting, ask your health care provider. °Eating and drinking °· Drink at least 8 eight-ounce glasses of water every day unless you are told not to by your health care provider. If you choose to breastfeed your baby, you may need to drink more water than this. °· Eat high-fiber foods every day. These foods may help prevent or relieve constipation. High-fiber foods include: °? Whole grain cereals and breads. °? Brown rice. °? Beans. °? Fresh fruits and vegetables. °Activity °· Return to your normal activities as told by your health care provider. Ask your health care provider  what activities are safe for you. °· Rest as much as possible. Try to rest or take a nap when your baby is sleeping. °· Do not lift anything that is heavier than your baby or 10 lb (4.5 kg) until your health care provider says that it is safe. °· Talk with your health care provider about when you can engage in sexual activity. This may depend on your: °? Risk of infection. °? Rate of healing. °? Comfort and desire to engage in sexual activity. °Vaginal Care °· If you have an episiotomy or a vaginal tear, check the area every day for signs of infection. Check for: °? More redness, swelling, or pain. °? More fluid or blood. °? Warmth. °? Pus or a bad smell. °· Do not use tampons or douches until your health care provider says this is safe. °· Watch for any blood clots that may pass from your vagina. These may look like clumps of dark red, brown, or black discharge. °General instructions °· Keep your perineum clean and dry as told by your health care provider. °· Wear loose, comfortable clothing. °· Wipe from front to back when you use the toilet. °· Ask your health care provider if you can shower or take a bath. If you had an episiotomy or a perineal tear during labor and delivery, your health care provider may tell you not to take baths for a certain length of time. °· Wear a bra that supports your breasts and fits you well. °· If possible, have someone help you with household activities and help care for your baby for at least a few days after   you leave the hospital. °· Keep all follow-up visits for you and your baby as told by your health care provider. This is important. °Contact a health care provider if: °· You have: °? Vaginal discharge that has a bad smell. °? Difficulty urinating. °? Pain when urinating. °? A sudden increase or decrease in the frequency of your bowel movements. °? More redness, swelling, or pain around your episiotomy or vaginal tear. °? More fluid or blood coming from your episiotomy or  vaginal tear. °? Pus or a bad smell coming from your episiotomy or vaginal tear. °? A fever. °? A rash. °? Little or no interest in activities you used to enjoy. °? Questions about caring for yourself or your baby. °· Your episiotomy or vaginal tear feels warm to the touch. °· Your episiotomy or vaginal tear is separating or does not appear to be healing. °· Your breasts are painful, hard, or turn red. °· You feel unusually sad or worried. °· You feel nauseous or you vomit. °· You pass large blood clots from your vagina. If you pass a blood clot from your vagina, save it to show to your health care provider. Do not flush blood clots down the toilet without having your health care provider look at them. °· You urinate more than usual. °· You are dizzy or light-headed. °· You have not breastfed at all and you have not had a menstrual period for 12 weeks after delivery. °· You have stopped breastfeeding and you have not had a menstrual period for 12 weeks after you stopped breastfeeding. °Get help right away if: °· You have: °? Pain that does not go away or does not get better with medicine. °? Chest pain. °? Difficulty breathing. °? Blurred vision or spots in your vision. °? Thoughts about hurting yourself or your baby. °· You develop pain in your abdomen or in one of your legs. °· You develop a severe headache. °· You faint. °· You bleed from your vagina so much that you fill two sanitary pads in one hour. °This information is not intended to replace advice given to you by your health care provider. Make sure you discuss any questions you have with your health care provider. °Document Released: 09/19/2000 Document Revised: 03/05/2016 Document Reviewed: 10/07/2015 °[employer]vier Interactive Patient Education © 2018 [employer] ° °

## 2018-05-18 NOTE — Discharge Summary (Addendum)
OB Discharge Summary     Patient Name: Nicole RhineHannah L Love DOB: 04/25/1991 MRN: [id number]  Date of admission: 05/17/2018 Delivering MD: Arvilla MarketWALLACE, CATHERINE LAUREN   Date of discharge: 05/18/2018  Admitting diagnosis: WATER BROKE Intrauterine pregnancy: 7026w2d     Secondary diagnosis:  Active Problems:   Amniotic fluid leaking   Normal labor  Additional problems: none     Discharge diagnosis: Term Pregnancy Delivered                                                                                                Post partum procedures:postpartum tubal ligation  Augmentation: Pitocin  Complications: None  Hospital course:  Onset of Labor With Vaginal Delivery     27 y.o. yo [id number] at 4426w2d was admitted in Latent Labor on 05/17/2018. Patient had an uncomplicated labor course as follows:  Membrane Rupture Time/Date: 8:00 AM ,05/16/2018   Intrapartum Procedures: Episiotomy: None [1]                                         Lacerations:  Periurethral [8]  Patient had a delivery of a Viable infant. 05/17/2018  Information for the patient's newborn:  Tommye StandardCaslin, Girl Dahlia ClientHannah [[id number]][[id number]]  Delivery Method: VBAC, Spontaneous(Filed from Delivery Summary)    Pateint had an uncomplicated postpartum course.  She is ambulating, tolerating a regular diet, passing flatus, and urinating well. Patient is discharged home in stable condition on 05/18/18.   Physical exam  Vitals:   05/17/18 2030 05/17/18 2135 05/18/18 0200 05/18/18 0514  BP: 123/84 122/89 126/83 112/75  Pulse: 77 80 75 71  Resp: 18 18 18 18   Temp: 97.8 F (36.6 C) 98.4 F (36.9 C)  98.1 F (36.7 C)  TempSrc: Axillary Oral  Oral  SpO2: 100% 98%  99%  Weight:       General: alert, cooperative and no distress Lochia: appropriate Uterine Fundus: firm Incision: Healing well with no significant drainage DVT Evaluation: No evidence of DVT seen on physical exam. Labs: Lab Results  Component Value Date   WBC 13.8 (H) 05/18/2018   HGB  8.9 (L) 05/18/2018   HCT 26.2 (L) 05/18/2018   MCV 84.0 05/18/2018   PLT 168 05/18/2018   CMP Latest Ref Rng & Units 10/04/2017  Glucose 65 - 99 mg/dL 85  BUN 6 - 20 mg/dL 8  Creatinine 1.300.44 - 8.651.00 mg/dL 7.840.60  Sodium 696135 - 295145 mmol/L 134(L)  Potassium 3.5 - 5.1 mmol/L 3.5  Chloride 101 - 111 mmol/L 104  CO2 22 - 32 mmol/L 22  Calcium 8.9 - 10.3 mg/dL 9.3  Total Protein 6.5 - 8.1 g/dL 7.4  Total Bilirubin 0.3 - 1.2 mg/dL 0.9  Alkaline Phos 38 - 126 U/L 50  AST 15 - 41 U/L 16  ALT 14 - 54 U/L 11(L)    Discharge instruction: per After Visit Summary and "Baby and Me Booklet".  After visit meds:  Allergies as of 05/18/2018   No Known Allergies  Medication List    STOP taking these medications   Butalbital-APAP-Caffeine 50-325-40 MG capsule   ferrous gluconate 324 MG tablet Commonly known as:  FERGON   ondansetron 8 MG disintegrating tablet Commonly known as:  ZOFRAN-ODT   PREPLUS 27-1 MG Tabs     TAKE these medications   ferrous sulfate 325 (65 FE) MG EC tablet Take 1 tablet (325 mg total) by mouth 2 (two) times daily.   ibuprofen 600 MG tablet Commonly known as:  ADVIL,MOTRIN Take 1 tablet (600 mg total) by mouth every 6 (six) hours.   oxyCODONE-acetaminophen 5-325 MG tablet Commonly known as:  PERCOCET/ROXICET Take 1 tablet by mouth every 4 (four) hours as needed (pain scale 4-7).       Diet: routine diet  Activity: Advance as tolerated. Pelvic rest for 6 weeks.   Outpatient follow up:4 week Follow up Appt:No future appointments. Follow up Visit:No follow-ups on file.  Postpartum contraception: Tubal Ligation  Newborn Data: Live born female  Birth Weight: 7 lb 3.7 oz (3280 g) APGAR: 9, 9  Newborn Delivery   Birth date/time:  05/17/2018 15:40:00 Delivery type:  VBAC, Spontaneous     Baby Feeding: Bottle and Breast Disposition:home with mother   05/18/2018 Sandre Kittyaniel K Olson, MD  Midwife attestation I have seen and examined this patient and  agree with above documentation in the resident's note.   Nicole RhineHannah L Love is a 27 y.o. [id number] s/p SVD.  Pain is well controlled. Plan for birth control is bilateral tubal ligation. Method of Feeding: breast and bottle  PE:  Gen: well appearing Heart: reg rate Lungs: normal WOB Fundus firm Ext: no pain, no edema  Recent Labs    05/18/18 0535  HGB 8.9*  HCT 26.2*     Assessment S/p SVD, BTL PPD # 1  Plan: - discharge home - postpartum care discussed - f/u clinic in 6 weeks for postpartum visit   Donette LarryMelanie Kehaulani Fruin, CNM 2:53 PM

## 2018-05-22 ENCOUNTER — Inpatient Hospital Stay (HOSPITAL_COMMUNITY): Admission: RE | Admit: 2018-05-22 | Payer: Medicaid Other | Source: Ambulatory Visit

## 2018-05-24 NOTE — Addendum Note (Signed)
Addendum  created 05/24/18 1239 by Phillips Groutarignan, Jin Shockley, MD   Intraprocedure Blocks edited, Sign clinical note

## 2018-05-24 NOTE — Addendum Note (Signed)
Addendum  created 05/24/18 1234 by Phillips Groutarignan, Djuana Littleton, MD   Child order released for a procedure order, Intraprocedure Blocks edited, Order Canceled from Note, Sign clinical note

## 2018-05-24 NOTE — Addendum Note (Signed)
Addendum  created 05/24/18 0907 by Phillips Groutarignan, Ignacia Gentzler, MD   Intraprocedure Staff edited

## 2018-05-29 ENCOUNTER — Inpatient Hospital Stay (HOSPITAL_COMMUNITY)
Admission: AD | Admit: 2018-05-29 | Discharge: 2018-05-29 | Disposition: A | Discharge status: Home / Self Care | Payer: Medicaid Other | Attending: Obstetrics and Gynecology | Admitting: Obstetrics and Gynecology

## 2018-05-29 ENCOUNTER — Encounter (HOSPITAL_COMMUNITY): Payer: Self-pay

## 2018-05-29 DIAGNOSIS — N61 Mastitis without abscess: Secondary | ICD-10-CM | POA: Diagnosis not present

## 2018-05-29 DIAGNOSIS — N644 Mastodynia: Secondary | ICD-10-CM | POA: Diagnosis present

## 2018-05-29 MED ORDER — CEPHALEXIN 500 MG PO CAPS: 500.0000 mg | ORAL_CAPSULE | Freq: Four times a day (QID) | ORAL | 0 refills | Status: AC

## 2018-05-29 NOTE — Discharge Instructions (Signed)
Get your medicine from the pharmacy and take as directed. Drink 64 ounces of water daily. Be seen in the clinic on Tuesday for a recheck. Return to MAU sooner if you are not getting better. See the Breastfeeding consultant at [facility] on Monday if you need help with breastfeeding.   Breastfeeding and Mastitis Mastitis is inflammation of the breast tissue. It can occur in women who are breastfeeding. This can make breastfeeding painful. Mastitis will sometimes go away on its own, especially if it is not caused by an infection (non-infectious mastitis). Your health care provider will help determine if medical treatment is needed. Treatment may be needed if the condition is caused by a bacterial infection (infectious mastitis). What are the causes? This condition is often associated with a blocked milkduct, which can happen when too much milk builds up in the breast. Causes of excess milk in the breast can include:  Poor latch-on. If your baby is not latched onto the breast properly, he or she may not empty your breast completely while breastfeeding.  Allowing too much time to pass between feedings.  Wearing a bra or other clothing that is too tight. This puts extra pressure on the milk ducts so milk does not flow through them as it should.  Milk remaining in the breast because it is overfilled (engorged).  Stress and fatigue.  Mastitis can also be caused by a bacterial infection. Bacteria may enter the breast tissue through cuts, cracks, or openings in the skin near the nipple area. Cracks in the skin are often caused when your baby does not latch on properly to the breast. What are the signs or symptoms? Symptoms of this condition include:  Swelling, redness, tenderness, and pain in an area of the breast. This usually affects the upper part of the breast, toward the armpit region. In most cases, it affects only one breast. In some cases, it may occur on both breasts at the same time and affect a  larger portion of breast tissue.  Swelling of the glands under the arm on the same side.  Fatigue, headache, and flu-like muscle aches.  Fever.  Rapid pulse.  Symptoms usually last 2 to 5 days. Breast pain and redness are at their worst on day 2 and day 3, and they usually go away by day 5. If an infection is left to progress, a collection of pus (abscess) may develop. How is this diagnosed? This condition can be diagnosed based on your symptoms and a physical exam. You may also have tests, such as:  Blood tests to determine if your body is fighting a bacterial infection.  Mammogram or ultrasound tests to rule out other problems or diseases.  Fluid tests. If an abscess has developed, the fluid in the abscess may be removed with a needle. The fluid may be analyzed to determine if bacteria are present.  Breast milk may be cultured and tested for bacteria.  How is this treated? This condition will sometimes go away on its own. Your health care provider may choose to wait 24 hours after first seeing you to decide whether treatment is needed. If treatment is needed, it may include:  Strategies to manage breastfeeding. This includes continuing to breastfeed or pump in order to allow adequate milk flow, using breast massage, and applying heat or cold to the affected area.  Self-care such as rest and increased fluid intake.  Medicine for pain.  Antibiotic medicine to treat a bacterial infection. This is usually taken by mouth.  If an abscess has developed, it may be treated by removing fluid with a needle.  Follow these instructions at home: Medicines  Take over-the-counter and prescription medicines only as told by your health care provider.  If you were prescribed an antibiotic medicine, take it as told by your health care provider. Do not stop taking the antibiotic even if you start to feel better. General instructions  Do not wear a tight or underwire bra. Wear a soft,  supportive bra.  Increase your fluid intake, especially if you have a fever.  Get plenty of rest. For breastfeeding:  Continue to empty your breasts as often as possible, either by breastfeeding or using an electric breast pump. This will lower the pressure and the pain that comes with it. Ask your health care provider if changes need to be made to your breastfeeding or pumping routine.  Keep your nipples clean and dry.  During breastfeeding, empty the first breast completely before going to the other breast. If your baby is not emptying your breasts completely, use a breast pump to empty your breasts.  Use breast massage during feeding or pumping sessions.  If directed, apply moist heat to the affected area of your breast right before breastfeeding or pumping. Use the heat source that your health care provider recommends.  If directed, put ice on the affected area of your breast right after breastfeeding or pumping: ? Put ice in a plastic bag. ? Place a towel between your skin and the bag. ? Leave the ice on for 20 minutes.  If you go back to work, pump your breasts while at work to stay in time with your nursing schedule.  Do not allow your breasts to become engorged. Contact a health care provider if:  You have pus-like discharge from the breast.  You have a fever.  Your symptoms do not improve within 2 days of starting treatment.  Your symptoms return after you have recovered from a breast infection. Get help right away if:  Your pain and swelling are getting worse.  You have pain that is not controlled with medicine.  You have a red line extending from the breast toward your armpit. Summary  Mastitis is inflammation of the breast tissue. It is often caused by a blocked milk duct or bacteria.  This condition may be treated with hot and cold compresses, medicines, self-care, and certain breastfeeding strategies.  If you were prescribed an antibiotic medicine, take it  as told by your health care provider. Do not stop taking the antibiotic even if you start to feel better.  Continue to empty your breasts as often as possible either by breastfeeding or using an electric breast pump. This information is not intended to replace advice given to you by your health care provider. Make sure you discuss any questions you have with your health care provider. Document Released: 01/17/2005 Document Revised: 09/23/2016 Document Reviewed: 09/23/2016 Elsevier Interactive Patient Education  2017 [employer]

## 2018-05-29 NOTE — MAU Provider Note (Signed)
History     CSN: [id number]  Arrival date and time: 05/29/18 [id number]   First Provider Initiated Contact with Patient 05/29/18 1137      Chief Complaint  Patient presents with  . Breast Pain   HPI Nicole Love 27 y.o. Postpartum and comes today with pain and hard lump in left breast.  Had fever of 102 at home and took tylenol.  Has been doing some pumping at home.  Stopped feeding the baby on the left side as she thought it would not be good for the baby if she had mastitis.   Has been pumping every 2-3 hours but hard spot has not resolved.  OB History    Gravida  5   Para  4   Term  4   Preterm      AB  1   Living  4     SAB  1   TAB      Ectopic      Multiple  0   Live Births  4           Past Medical History:  Diagnosis Date  . Hyperthyroidism   . Pregnancy     Past Surgical History:  Procedure Laterality Date  . CESAREAN SECTION    . TUBAL LIGATION N/A 05/17/2018   Procedure: POST PARTUM TUBAL LIGATION;  Surgeon: Hermina StaggersErvin, Michael L, MD;  [facility];  Service: Gynecology;  Laterality: N/A;    Family History  Problem Relation Age of Onset  . Diabetes Maternal Grandmother     Social History   Tobacco Use  . Smoking status: Never Smoker  . Smokeless tobacco: Never Used  Substance Use Topics  . Alcohol use: No  . Drug use: No    Allergies: No Known Allergies  Medications Prior to Admission  Medication Sig Dispense Refill Last Dose  . ferrous sulfate 325 (65 FE) MG EC tablet Take 1 tablet (325 mg total) by mouth 2 (two) times daily. 60 tablet 0   . ibuprofen (ADVIL,MOTRIN) 600 MG tablet Take 1 tablet (600 mg total) by mouth every 6 (six) hours. 30 tablet 0   . oxyCODONE-acetaminophen (PERCOCET/ROXICET) 5-325 MG tablet Take 1 tablet by mouth every 4 (four) hours as needed (pain scale 4-7). 15 tablet 0     Review of Systems  Constitutional: Positive for fever.  Genitourinary:       Breast pain in left breast   Physical  Exam   Blood pressure 109/85, pulse 76, temperature 97.7 F (36.5 C), temperature source Oral, resp. rate 16, weight 63.5 kg, SpO2 97 %, unknown if currently breastfeeding.  Physical Exam  Nursing note and vitals reviewed. Constitutional: She is oriented to person, place, and time. She appears well-developed and well-nourished.  HENT:  Head: Normocephalic.  Eyes: EOM are normal.  Neck: Neck supple.  Genitourinary:  Genitourinary Comments: Left breast has a small 2 mm hairline red line in the center of the nipple.  There is slightly red area approx,. 5cmx3cm with a hard area underneath on the outer and upper quadrant of the left breast.  It is mildly tender to palpation and the depth is approx 1 to 1.5 cm. Right nipple is intact and no redness on the right breast.  Musculoskeletal: Normal range of motion.  Neurological: She is alert and oriented to person, place, and time.  Skin: Skin is warm and dry.  Psychiatric: She has a normal mood and affect.    MAU Course  Procedures  MDM Ice packs to left breast for approx 10 minutes and then baby latched to the breast and with stimulation to the baby, there was a good, nontender latch and audible swallowing in football position.  Mother using gentle stroking toward the nipple.  She reports after the ice packs, there was less redness and during the feeling, the lump is feeling smaller but is still present.  Advised to repeat this process today.    She had not been putting the baby to this breast as she was afraid the baby would get some blood from the crack in her nipple.  Lump in the left breast is much improved but not completely resolved with feeding in MAU.  Encouraged client to repeat the process at home to help the lump resolve.  Assessment and Plan  Mastitis  Plan Continue ice packs before feeding if the breast is hot and red. Feed frequently and use the electric pump after feeding if breast is not fully drained. Will give  antibiotics - Keflex 500 mg QID x 10 days - client ot pick up today. Note sent to clinic to schedule in on Tuesday for a recheck of her breast. See also the AVS for additional instructions.  Kylo Gavin L Jasraj Lappe 05/29/2018, 12:04 PM

## 2018-05-29 NOTE — MAU Note (Signed)
Nicole RhineHannah L Constante is a 27 y.o. here in MAU reporting:  +left sided breast pain Onset of complaint: started a couple of days ago Reports redness at nipple radiating to armpit. Hard. Feels burning at times. Reports fever of 101 the other day but went away after taking tylenol. PP vaginal delivery on 8/12 Vitals:   05/29/18 1050  BP: 109/85  Pulse: 76  Resp: 16  Temp: 97.7 F (36.5 C)  SpO2: 97%      Lab orders placed from triage:none

## 2018-06-01 ENCOUNTER — Ambulatory Visit: Payer: Medicaid Other

## 2018-06-28 ENCOUNTER — Telehealth: Payer: Self-pay | Admitting: Student

## 2018-06-28 ENCOUNTER — Ambulatory Visit: Payer: Medicaid Other | Admitting: Student

## 2018-06-28 NOTE — Telephone Encounter (Signed)
Called and left patient a voicemail to get her pp visit rescheduled and to give the office a call back.

## 2018-07-01 ENCOUNTER — Encounter: Payer: Self-pay | Admitting: *Deleted

## 2018-10-27 ENCOUNTER — Ambulatory Visit: Payer: Medicaid Other | Admitting: Nurse Practitioner

## 2018-10-27 ENCOUNTER — Encounter: Payer: Self-pay | Admitting: Nurse Practitioner

## 2019-02-07 ENCOUNTER — Ambulatory Visit (INDEPENDENT_AMBULATORY_CARE_PROVIDER_SITE_OTHER): Payer: Medicaid Other | Admitting: *Deleted

## 2019-02-07 ENCOUNTER — Encounter: Payer: Self-pay | Admitting: Family Medicine

## 2019-02-07 ENCOUNTER — Other Ambulatory Visit: Payer: Self-pay

## 2019-02-07 VITALS — BP 132/87 | HR 85 | Ht 66.0 in | Wt 148.1 lb

## 2019-02-07 DIAGNOSIS — Z3201 Encounter for pregnancy test, result positive: Secondary | ICD-10-CM | POA: Diagnosis not present

## 2019-02-07 DIAGNOSIS — Z3200 Encounter for pregnancy test, result unknown: Secondary | ICD-10-CM

## 2019-02-07 LAB — POCT PREGNANCY, URINE: Preg Test, Ur: POSITIVE — AB

## 2019-02-07 NOTE — Progress Notes (Signed)
Pt here for pregnancy test.  Pregnancy test resulted positive.  Pt denies abdominal pain or bleeding.    LMP: 12/11/18 EDD:09/17/19 GA:[redacted]w[redacted]d  Proof of pregnancy letter provided by front office.   Pt advised that if she develops abdominal pain or bleeding, to go to MAU.  Pt verbalized understanding.   Reviewed with Dr. Adrian Blackwater who recommended that pt have an ultrasound to determine location of the pregnancy as pt recently had a BTL. Ultrasound order placed and scheduled for Thursday May 7th @ 1000.  Left voicemail informing pt of appointment.

## 2019-02-07 NOTE — Progress Notes (Signed)
Chart reviewed - agree with RN documentation.   

## 2019-02-10 ENCOUNTER — Ambulatory Visit (HOSPITAL_COMMUNITY)
Admission: RE | Admit: 2019-02-10 | Discharge: 2019-02-10 | Disposition: A | Discharge status: Home / Self Care | Payer: Medicaid Other | Source: Ambulatory Visit | Attending: Family Medicine | Admitting: Family Medicine

## 2019-02-10 ENCOUNTER — Other Ambulatory Visit: Payer: Self-pay | Admitting: Family Medicine

## 2019-02-10 ENCOUNTER — Other Ambulatory Visit: Payer: Self-pay

## 2019-02-10 DIAGNOSIS — Z3200 Encounter for pregnancy test, result unknown: Secondary | ICD-10-CM | POA: Diagnosis not present

## 2019-02-10 DIAGNOSIS — Z3A01 Less than 8 weeks gestation of pregnancy: Secondary | ICD-10-CM | POA: Diagnosis not present

## 2019-02-10 DIAGNOSIS — O208 Other hemorrhage in early pregnancy: Secondary | ICD-10-CM | POA: Diagnosis not present

## 2019-02-15 ENCOUNTER — Encounter (HOSPITAL_COMMUNITY): Payer: Self-pay

## 2019-02-15 ENCOUNTER — Other Ambulatory Visit: Payer: Self-pay

## 2019-02-15 ENCOUNTER — Inpatient Hospital Stay (HOSPITAL_COMMUNITY)
Admission: AD | Admit: 2019-02-15 | Discharge: 2019-02-15 | Disposition: A | Discharge status: Home / Self Care | Payer: Medicaid Other | Attending: Obstetrics and Gynecology | Admitting: Obstetrics and Gynecology

## 2019-02-15 ENCOUNTER — Telehealth (INDEPENDENT_AMBULATORY_CARE_PROVIDER_SITE_OTHER): Payer: Medicaid Other | Admitting: General Practice

## 2019-02-15 DIAGNOSIS — O219 Vomiting of pregnancy, unspecified: Secondary | ICD-10-CM

## 2019-02-15 DIAGNOSIS — O210 Mild hyperemesis gravidarum: Secondary | ICD-10-CM | POA: Insufficient documentation

## 2019-02-15 DIAGNOSIS — Z3A01 Less than 8 weeks gestation of pregnancy: Secondary | ICD-10-CM | POA: Diagnosis not present

## 2019-02-15 LAB — URINALYSIS, ROUTINE W REFLEX MICROSCOPIC
Bilirubin Urine: NEGATIVE
Glucose, UA: NEGATIVE mg/dL
Ketones, ur: 80 mg/dL — AB
Leukocytes,Ua: NEGATIVE
Nitrite: NEGATIVE
Protein, ur: 100 mg/dL — AB
Specific Gravity, Urine: 1.033 — ABNORMAL HIGH (ref 1.005–1.030)
pH: 5 (ref 5.0–8.0)

## 2019-02-15 MED ORDER — LACTATED RINGERS IV BOLUS
1000.0000 mL | Freq: Once | INTRAVENOUS | Status: AC
  Administered 2019-02-15: 20:00:00 1000 mL via INTRAVENOUS

## 2019-02-15 MED ORDER — FAMOTIDINE IN NACL 20-0.9 MG/50ML-% IV SOLN
20.0000 mg | Freq: Once | INTRAVENOUS | Status: AC
  Administered 2019-02-15: 20 mg via INTRAVENOUS
  Filled 2019-02-15: qty 50

## 2019-02-15 MED ORDER — ONDANSETRON 4 MG PO TBDP: 4.0000 mg | ORAL_TABLET | Freq: Three times a day (TID) | ORAL | 0 refills | Status: DC | PRN

## 2019-02-15 MED ORDER — LACTATED RINGERS IV BOLUS
1000.0000 mL | Freq: Once | INTRAVENOUS | Status: AC
  Administered 2019-02-15: 18:00:00 1000 mL via INTRAVENOUS

## 2019-02-15 MED ORDER — ONDANSETRON HCL 4 MG/2ML IJ SOLN
4.0000 mg | Freq: Once | INTRAMUSCULAR | Status: AC
  Administered 2019-02-15: 4 mg via INTRAVENOUS
  Filled 2019-02-15: qty 2

## 2019-02-15 NOTE — MAU Note (Addendum)
Pt said she has been vomiting for 3 days and can't eat. Not tried anything for vomiting and said things she has taken previously haven't worked. Pt had tubal ligation after delivery 05/17/18

## 2019-02-15 NOTE — Telephone Encounter (Signed)
Patient called and left message on nurse voicemail line stating she hasn't heard about a next appt yet. Patient reports vomiting for the past 3 days straight and is unable to keep anything down. Patient states she got this sick with her last pregnancy and literally tried everything. States the only thing that worked was Zofran which knows she isn't supposed to have. Patient requests call back.  Called patient and asked about reglan, diclegis and phenergan. Patient reports side effects to all. Discussed with Dr Earlene Plater who advises patient go to MAU for evaluation as she hasn't been seen this pregnancy. Discussed with patient. Patient verbalized understanding & had no questions.

## 2019-02-15 NOTE — Discharge Instructions (Signed)

## 2019-02-15 NOTE — MAU Provider Note (Signed)
History    CSN: [id number] Arrival date and time: 02/15/19 1648 First Provider Initiated Contact with Patient 02/15/19 1749     Chief Complaint  Patient presents with  . Emesis   HPI 28yo [id number] at 5451w3d by L/5 presenting with nausea and vomiting for last three days. States gets this with every pregnancy and usually lasts until she is about 18-20 weeks. Works at [employer] and different smells have really been bothering her. States Zofran is usually what helps - phenergan makes her lightheaded and Reglan usually makes nausea worse because of the coating on the tablet. No blood in emesis, normal BMs. Denies abdominal pain or reflux.   Had BTL in the fall with Filshie clips immediately postpartum. Confirmed IUP on U/S this pregnancy.   OB History    Gravida  6   Para  4   Term  4   Preterm      AB  1   Living  4     SAB  1   TAB      Ectopic      Multiple  0   Live Births  4           Past Medical History:  Diagnosis Date  . Hyperthyroidism   . Pregnancy     Past Surgical History:  Procedure Laterality Date  . CESAREAN SECTION    . TUBAL LIGATION N/A 05/17/2018   Procedure: POST PARTUM TUBAL LIGATION;  Surgeon: Hermina StaggersErvin, Michael L, MD;  [facility];  Service: Gynecology;  Laterality: N/A;    Family History  Problem Relation Age of Onset  . Diabetes Maternal Grandmother     Social History   Tobacco Use  . Smoking status: Never Smoker  . Smokeless tobacco: Never Used  Substance Use Topics  . Alcohol use: No  . Drug use: No    Allergies: No Known Allergies  Medications Prior to Admission  Medication Sig Dispense Refill Last Dose  . ferrous sulfate 325 (65 FE) MG EC tablet Take 1 tablet (325 mg total) by mouth 2 (two) times daily. 60 tablet 0 Taking    Review of Systems  Constitutional: Positive for appetite change and fatigue. Negative for activity change and fever.  HENT: Negative for sore throat.   Respiratory: Negative for cough  and shortness of breath.   Cardiovascular: Negative for chest pain.  Gastrointestinal: Positive for nausea and vomiting. Negative for abdominal pain, constipation and diarrhea.  Genitourinary: Negative for dysuria and flank pain.  Musculoskeletal: Negative for back pain.  Psychiatric/Behavioral: Positive for sleep disturbance. The patient is not nervous/anxious.    Physical Exam   Blood pressure 126/76, pulse 93, temperature 99 F (37.2 C), temperature source Oral, resp. rate 16, weight 66.4 kg, SpO2 100 %, unknown if currently breastfeeding.  Physical Exam  Nursing note and vitals reviewed. Constitutional: She is oriented to person, place, and time. She appears well-developed and well-nourished. No distress.  HENT:  Head: Normocephalic and atraumatic.  Mouth/Throat: Oropharynx is clear and moist.  Eyes: Conjunctivae and EOM are normal.  Neck: No thyromegaly present.  Cardiovascular: Normal rate, regular rhythm and intact distal pulses.  No murmur heard. Normal cap refill  Respiratory: Effort normal and breath sounds normal. No respiratory distress.  GI: Soft. Bowel sounds are normal. There is no abdominal tenderness. There is no guarding.  Musculoskeletal:        General: No edema.  Neurological: She is alert and oriented to person, place, and time.  Skin:  Skin is warm and dry.  Psychiatric: She has a normal mood and affect. Her behavior is normal.   MAU Course  Procedures  MDM -- discussed risk of use of Zofran in first trimester - patient voiced understanding and states only medication that usually works for her, will give 1L LR bolus and IV Zofran -- U/A with signs of dehydration, ketones and proteinuria - will give additional 1L  -- po challenge tolerate, doing better  Assessment and Plan  Kathalyn is a 28FW [id number] at [redacted]w[redacted]d who presents with nausea and vomiting in early pregnancy that is similar to prior pregnancy. Symptoms improved with Pepcid, Zofran and IVF. Discharged  home with Zofran. Has reached out to clinic to schedule initial prenatal visit. Stable for discharge home, all questions answered. Patient in agreement with plan.   Tamera Stands, DO  02/15/2019, 8:14 PM

## 2019-02-17 LAB — CULTURE, OB URINE

## 2019-02-21 ENCOUNTER — Telehealth: Payer: Self-pay | Admitting: Obstetrics and Gynecology

## 2019-02-21 NOTE — Telephone Encounter (Signed)
The patient visits the [facility] pharmacy on [address]. She stated she would like a prescription sent for vomiting. She also stated she would like a medication other than promethazine or phenergan.

## 2019-02-21 NOTE — Telephone Encounter (Signed)
LM for that we have sent a MyChart for another option to take for nausea.  And that she needs to become established with a OB provider to further manage her if she has questions to please give office a call or contact via MyChart.

## 2019-02-22 ENCOUNTER — Telehealth: Payer: Self-pay

## 2019-02-22 NOTE — Telephone Encounter (Signed)
Called pt in regards to pt's MyChart message.  I informed pt that I am calling because sometimes messages can be misunderstood.  I explained to the pt that because she is stating that the medication that was prescribed is not effective and she does not want to take Phenergan.  I advised pt to take Vit B6 100 mg po tablet and Unisom 25 mg tablet until she is seen with an OB provider.  Pt stated that she is having a lot of vomiting and not able to keep food down but she has had this in all her pregnancies and she has missed two weeks of work.  I informed pt that if she is still feeling that bad to please go to MAU because she may need fluids.  I explained to the pt that we could order Diclegis however it may take up to two weeks for prior authorization.  Pt stated no that she has taken that in the past and it did no if it worked.  I explained to the pt to please go to MAU if she gets worse.  Pt stated understanding.

## 2019-03-09 ENCOUNTER — Telehealth: Payer: Medicaid Other

## 2019-03-22 ENCOUNTER — Telehealth: Payer: Self-pay | Admitting: Family Medicine

## 2019-03-22 NOTE — Telephone Encounter (Signed)
Patient was called and instructed about her visit for 03/23/2019. °

## 2019-03-23 ENCOUNTER — Other Ambulatory Visit (HOSPITAL_COMMUNITY)
Admission: RE | Admit: 2019-03-23 | Discharge: 2019-03-23 | Disposition: A | Discharge status: Home / Self Care | Payer: Medicaid Other | Source: Ambulatory Visit | Attending: Family Medicine | Admitting: Family Medicine

## 2019-03-23 ENCOUNTER — Ambulatory Visit (INDEPENDENT_AMBULATORY_CARE_PROVIDER_SITE_OTHER): Payer: Medicaid Other | Admitting: Family Medicine

## 2019-03-23 ENCOUNTER — Encounter: Payer: Self-pay | Admitting: Family Medicine

## 2019-03-23 ENCOUNTER — Other Ambulatory Visit: Payer: Self-pay

## 2019-03-23 VITALS — BP 120/82 | HR 88 | Temp 98.6°F | Wt 139.5 lb

## 2019-03-23 DIAGNOSIS — E079 Disorder of thyroid, unspecified: Secondary | ICD-10-CM

## 2019-03-23 DIAGNOSIS — O99280 Endocrine, nutritional and metabolic diseases complicating pregnancy, unspecified trimester: Secondary | ICD-10-CM | POA: Diagnosis not present

## 2019-03-23 DIAGNOSIS — R8271 Bacteriuria: Secondary | ICD-10-CM

## 2019-03-23 DIAGNOSIS — Z3480 Encounter for supervision of other normal pregnancy, unspecified trimester: Secondary | ICD-10-CM | POA: Insufficient documentation

## 2019-03-23 DIAGNOSIS — O34219 Maternal care for unspecified type scar from previous cesarean delivery: Secondary | ICD-10-CM

## 2019-03-23 DIAGNOSIS — O219 Vomiting of pregnancy, unspecified: Secondary | ICD-10-CM | POA: Diagnosis not present

## 2019-03-23 DIAGNOSIS — Z3481 Encounter for supervision of other normal pregnancy, first trimester: Secondary | ICD-10-CM | POA: Diagnosis not present

## 2019-03-23 DIAGNOSIS — N898 Other specified noninflammatory disorders of vagina: Secondary | ICD-10-CM

## 2019-03-23 DIAGNOSIS — O99281 Endocrine, nutritional and metabolic diseases complicating pregnancy, first trimester: Secondary | ICD-10-CM | POA: Diagnosis not present

## 2019-03-23 DIAGNOSIS — Z3A11 11 weeks gestation of pregnancy: Secondary | ICD-10-CM | POA: Diagnosis not present

## 2019-03-23 DIAGNOSIS — O99891 Other specified diseases and conditions complicating pregnancy: Secondary | ICD-10-CM

## 2019-03-23 DIAGNOSIS — O9989 Other specified diseases and conditions complicating pregnancy, childbirth and the puerperium: Secondary | ICD-10-CM

## 2019-03-23 LAB — POCT URINALYSIS DIP (DEVICE)
Glucose, UA: NEGATIVE mg/dL
Hgb urine dipstick: NEGATIVE
Ketones, ur: >=160 mg/dL — AB
Leukocytes,Ua: NEGATIVE
Nitrite: POSITIVE — AB
Protein, ur: 30 mg/dL — AB
Specific Gravity, Urine: 1.02 (ref 1.005–1.030)
Urobilinogen, UA: 1 mg/dL (ref 0.0–1.0)
pH: 7 (ref 5.0–8.0)

## 2019-03-23 MED ORDER — PROCHLORPERAZINE MALEATE 10 MG PO TABS: 10.0000 mg | ORAL_TABLET | Freq: Four times a day (QID) | ORAL | 3 refills | Status: DC | PRN

## 2019-03-23 MED ORDER — AMBULATORY NON FORMULARY MEDICATION: 1.0000 | 0 refills | Status: DC

## 2019-03-23 MED ORDER — SCOPOLAMINE 1 MG/3DAYS TD PT72: 1.0000 | MEDICATED_PATCH | TRANSDERMAL | 12 refills | Status: DC

## 2019-03-23 NOTE — Progress Notes (Signed)
Subjective:   Nicole Love is a 28 y.o. P5K9326 at 106w4dby early ultrasound being seen today for her first obstetrical visit.  Her obstetrical history is significant for previous C-section with VBAC x 3, h/o hyperthyroid associated with hyperemesis. s/p BTL 10 months ago. . Patient does intend to breast feed. Pregnancy history fully reviewed.  Patient reports nausea and vomiting.  HISTORY: OB History  Gravida Para Term Preterm AB Living  _0 0 1 4  SAB TAB Ectopic Multiple Live Births  1 0 0 0 4    # Outcome Date GA Lbr Len/2nd Weight Sex Delivery Anes PTL Lv  6 Current           5 Term 05/17/18 498w2d8:35 / 00:05 7 lb 3.7 oz (3.28 kg) F VBAC EPI  LIV     Name: Nicole Love   Apgar1: 9  Apgar5: 9  4 Term 12/15/16 4055w0d lb 15 oz (3.6 kg) M VBAC None N LIV     Birth Comments: System Generated. Please review and update pregnancy details.  3 Term 01/10/14 42w35w0dlb 5 oz (3.317 kg) F Vag-Spont  N LIV  2 Term 12/12/10 42w082w0db 11 oz (3.033 kg) F CS-Unspec  N LIV     Birth Comments: failure to progress   1 SAB             Past Medical History:  Diagnosis Date  . Hyperthyroidism   . Pregnancy    Past Surgical History:  Procedure Laterality Date  . CESAREAN SECTION    . TUBAL LIGATION N/A 05/17/2018   Procedure: POST PARTUM TUBAL LIGATION;  Surgeon: ErvinChancy Milroy  [facility] [facility] [place];  Laterality: N/A;   Family History  Problem Relation Age of Onset  . Diabetes Maternal Grandmother    Social History   Tobacco Use  . Smoking status: Never Smoker  . Smokeless tobacco: Never Used  Substance Use Topics  . Alcohol use: No  . Drug use: No   No Known Allergies Current Outpatient Medications on File Prior to Visit  Medication Sig Dispense Refill  . Prenatal Vit-Fe Fumarate-FA (PRENATAL PO) Take by mouth.    . ondansetron (ZOFRAN-ODT) 4 MG disintegrating tablet Take 1 tablet (4 mg total) by mouth every 8 (eight)  hours as needed for nausea or vomiting. (Patient not taking: Reported on 03/23/2019) 30 tablet 0   No current facility-administered medications on file prior to visit.      Exam   Vitals:   03/23/19 1029  BP: 120/82  Pulse: 88  Temp: 98.6 F (37 C)   Fetal Heart Rate (bpm): 172(Simultaneous filing. User may not have seen previous data.)  Uterus:     Pelvic Exam: Perineum: no hemorrhoids, normal perineum   Vulva: normal external genitalia, no lesions   Vagina:  normal mucosa, normal discharge   Cervix: no lesions and normal, pap smear done.    Adnexa: normal adnexa and no mass, fullness, tenderness   Bony Pelvis: average  System: General: well-developed, well-nourished female in no acute distress   Breast:  normal appearance, no masses or tenderness   Skin: normal coloration and turgor, no rashes   Neurologic: oriented, normal, negative, normal mood   Extremities: normal strength, tone, and muscle mass, ROM of all joints is normal   HEENT PERRLA, extraocular movement intact and sclera clear, anicteric   Mouth/Teeth mucous membranes  moist, pharynx normal without lesions and dental hygiene good   Neck supple and no masses   Cardiovascular: regular rate and rhythm   Respiratory:  no respiratory distress, normal breath sounds   Abdomen: soft, non-tender; bowel sounds normal; no masses,  no organomegaly     Assessment:   Pregnancy: [id number] Patient Active Problem List   Diagnosis Date Noted  . Supervision of other normal pregnancy, antepartum 03/23/2019  . Thyroid disease affecting pregnancy   . Previous cesarean delivery, antepartum 12/16/2017  . Hyperthyroidism 10/22/2017     Plan:  1. Supervision of other normal pregnancy, antepartum New Ob labs, genetic screen, anatomy u/s ordered - [facility] AMB BABYSCRIPTS SCHEDULE OPTIMIZATION - AMBULATORY NON FORMULARY MEDICATION; 1 Device by Other route once a week. Blood Pressure Cuff Medium Monitored Regularly at home ICD 10:Z34.90   Dispense: 1 kit; Refill: 0 - Cytology - PAP( [place]) - Genetic Screening - Culture, OB Urine - Obstetric Panel, Including HIV - [place] MFM OB COMP + 14 WK; Future  2. Vaginal discharge - Cervicovaginal ancillary only( [place])  3. Previous cesarean delivery, antepartum VBAC x 3, desires VBAC again  4. Thyroid disease affecting pregnancy Usually assoc. With Hyperemesis, and resolves by mid-pregnancy--will check levels. - TSH - T3, free - T4, free  5. Nausea and vomiting in pregnancy prior to [redacted] weeks gestation Usually zofran works, and it is not working. Has bad reaction to phenergan. Diclegis did not work. - prochlorperazine (COMPAZINE) 10 MG tablet; Take 1 tablet (10 mg total) by mouth every 6 (six) hours as needed for nausea or vomiting.  Dispense: 30 tablet; Refill: 3 - scopolamine (TRANSDERM-SCOP, 1.5 MG,) 1 MG/3DAYS; Place 1 patch (1.5 mg total) onto the skin every 3 (three) days.  Dispense: 10 patch; Refill: 12   Initial labs drawn. Continue prenatal vitamins. Genetic Screening discussed, First trimester screen: ordered. Ultrasound discussed; fetal anatomic survey: ordered. Problem list reviewed and updated. The nature of [place] with multiple MDs and other Advanced Practice Providers was explained to patient; also emphasized that residents, students are part of our team. Routine obstetric precautions reviewed. Return in 6 weeks (on 05/04/2019) for [facility] virtual.

## 2019-03-23 NOTE — Progress Notes (Signed)
Pt signed up for babyscripts optimization.  Pt states she does not have access to a BP cuff.  Order placed for BP cuff and message sent to front office staff to fax prescription to [facility].  Pt instructed in how to take a BP, including timing and positioning.  Pt verbalized and demonstrated understanding.  Medicaid Home Form completed.

## 2019-03-24 LAB — OBSTETRIC PANEL, INCLUDING HIV
Antibody Screen: NEGATIVE
Basophils Absolute: 0 10*3/uL (ref 0.0–0.2)
Basos: 0 %
EOS (ABSOLUTE): 0.1 10*3/uL (ref 0.0–0.4)
Eos: 1 %
HIV Screen 4th Generation wRfx: NONREACTIVE
Hematocrit: 38.6 % (ref 34.0–46.6)
Hemoglobin: 12.7 g/dL (ref 11.1–15.9)
Hepatitis B Surface Ag: NEGATIVE
Immature Grans (Abs): 0 10*3/uL (ref 0.0–0.1)
Immature Granulocytes: 0 %
Lymphocytes Absolute: 2.4 10*3/uL (ref 0.7–3.1)
Lymphs: 29 %
MCH: 30 pg (ref 26.6–33.0)
MCHC: 32.9 g/dL (ref 31.5–35.7)
MCV: 91 fL (ref 79–97)
Monocytes Absolute: 0.3 10*3/uL (ref 0.1–0.9)
Monocytes: 4 %
Neutrophils Absolute: 5.4 10*3/uL (ref 1.4–7.0)
Neutrophils: 66 %
Platelets: 233 10*3/uL (ref 150–450)
RBC: 4.24 x10E6/uL (ref 3.77–5.28)
RDW: 12.8 % (ref 11.7–15.4)
RPR Ser Ql: NONREACTIVE
Rh Factor: POSITIVE
Rubella Antibodies, IGG: 5.93 index (ref >0.99)
WBC: 8.3 10*3/uL (ref 3.4–10.8)

## 2019-03-24 LAB — CERVICOVAGINAL ANCILLARY ONLY
Bacterial vaginitis: POSITIVE — AB
Candida vaginitis: POSITIVE — AB

## 2019-03-24 LAB — CYTOLOGY - PAP
Chlamydia: NEGATIVE
Diagnosis: NEGATIVE
Neisseria Gonorrhea: NEGATIVE

## 2019-03-24 LAB — T4, FREE: Free T4: 1.36 ng/dL (ref 0.82–1.77)

## 2019-03-24 LAB — TSH: TSH: 0.215 u[IU]/mL — ABNORMAL LOW (ref 0.450–4.500)

## 2019-03-24 LAB — T3, FREE: T3, Free: 3.5 pg/mL (ref 2.0–4.4)

## 2019-03-24 MED ORDER — FLUCONAZOLE 150 MG PO TABS: 150.0000 mg | ORAL_TABLET | Freq: Every day | ORAL | 2 refills | Status: DC

## 2019-03-24 MED ORDER — METRONIDAZOLE 500 MG PO TABS: 500.0000 mg | ORAL_TABLET | Freq: Two times a day (BID) | ORAL | 0 refills | Status: DC

## 2019-03-24 NOTE — Addendum Note (Signed)
Addended by: Donnamae Jude on: 03/24/2019 06:16 PM   Modules accepted: Orders

## 2019-03-26 LAB — URINE CULTURE, OB REFLEX

## 2019-03-26 LAB — CULTURE, OB URINE

## 2019-03-28 DIAGNOSIS — R8271 Bacteriuria: Secondary | ICD-10-CM | POA: Insufficient documentation

## 2019-03-28 MED ORDER — CEPHALEXIN 500 MG PO CAPS: 500.0000 mg | ORAL_CAPSULE | Freq: Four times a day (QID) | ORAL | 0 refills | Status: DC

## 2019-03-28 NOTE — Addendum Note (Signed)
Addended by: Donnamae Jude on: 03/28/2019 08:23 AM   Modules accepted: Orders

## 2019-03-29 ENCOUNTER — Telehealth (INDEPENDENT_AMBULATORY_CARE_PROVIDER_SITE_OTHER): Payer: Medicaid Other | Admitting: Lactation Services

## 2019-03-29 DIAGNOSIS — O99891 Other specified diseases and conditions complicating pregnancy: Secondary | ICD-10-CM

## 2019-03-29 DIAGNOSIS — O9989 Other specified diseases and conditions complicating pregnancy, childbirth and the puerperium: Secondary | ICD-10-CM

## 2019-03-29 DIAGNOSIS — R8271 Bacteriuria: Secondary | ICD-10-CM

## 2019-03-29 NOTE — Telephone Encounter (Signed)
Called pt to inform her that her urine culture came back positive and that Keflex was sent into her pharmacy and that she needs to pick it up and begin taking and to finish the course. Pt reports she is using the Flagyl that was called in.   Pt is requesting a prescription for Scapolomine patches to be called in for nausea. Informed pt that the order was sent in the pharmacy, The pharmacy reports they need an approval from the office. Called the [facility] on [address] and spoke with Glennon Mac in regards to Scopolamine and reports that the med has been approved and they are getting it ready for pt. Pt was informed.

## 2019-04-01 DIAGNOSIS — Z3480 Encounter for supervision of other normal pregnancy, unspecified trimester: Secondary | ICD-10-CM | POA: Diagnosis not present

## 2019-04-11 ENCOUNTER — Encounter: Payer: Self-pay | Admitting: General Practice

## 2019-05-04 ENCOUNTER — Telehealth: Payer: Medicaid Other | Admitting: Family Medicine

## 2019-05-04 ENCOUNTER — Telehealth (INDEPENDENT_AMBULATORY_CARE_PROVIDER_SITE_OTHER): Payer: Medicaid Other | Admitting: Obstetrics & Gynecology

## 2019-05-04 ENCOUNTER — Encounter: Payer: Self-pay | Admitting: Obstetrics & Gynecology

## 2019-05-04 ENCOUNTER — Other Ambulatory Visit: Payer: Self-pay

## 2019-05-04 DIAGNOSIS — O26892 Other specified pregnancy related conditions, second trimester: Secondary | ICD-10-CM

## 2019-05-04 DIAGNOSIS — E0590 Thyrotoxicosis, unspecified without thyrotoxic crisis or storm: Secondary | ICD-10-CM

## 2019-05-04 DIAGNOSIS — O34219 Maternal care for unspecified type scar from previous cesarean delivery: Secondary | ICD-10-CM

## 2019-05-04 DIAGNOSIS — Z3480 Encounter for supervision of other normal pregnancy, unspecified trimester: Secondary | ICD-10-CM

## 2019-05-04 DIAGNOSIS — Z3A17 17 weeks gestation of pregnancy: Secondary | ICD-10-CM

## 2019-05-04 NOTE — Progress Notes (Signed)
80 Nicole Love not available virtually . I  called Nicole Love cell and home numbers and left message I am calling for your virtual visit and will call again in a few minutes- please be available virtually or by phone. Linda,RN I connected with  Nicole Love on 05/04/19 at  8:15 AM EDT by telephone and verified that I am speaking with the correct person using two identifiers.   I discussed the limitations, risks, security and privacy concerns of performing an evaluation and management service by telephone and virtually and the availability of in person appointments. I also discussed with the patient that there may be a patient responsible charge related to this service. The patient expressed understanding and agreed to proceed. She has received her bp cuff but had not used it yet- she could not find all her batteries but will do that today and  start logging bp into Babyscripts once  A week.   Linda,RN 05/04/2019  8:28 AM

## 2019-05-04 NOTE — Progress Notes (Signed)
   [facility] VIRTUAL VIDEO VISIT ENCOUNTER NOTE  Provider location: Center for [employer] at [facility]   I connected with [name] on 05/04/19 at  8:15 AM EDT by Chart Video Encounter at home and verified that I am speaking with the correct person using two identifiers.   I discussed the limitations, risks, security and privacy concerns of performing an evaluation and management service virtually and the availability of in person appointments. I also discussed with the patient that there may be a patient responsible charge related to this service. The patient expressed understanding and agreed to proceed. Subjective:  Nicole Love is a 28 y.o. [id number] at [redacted]w[redacted]d being seen today for ongoing prenatal care.  She is currently monitored for the following issues for this high-risk pregnancy and has Hyperthyroidism; Previous cesarean delivery, antepartum; Thyroid disease affecting pregnancy; Supervision of other normal pregnancy, antepartum; and Asymptomatic bacteriuria during pregnancy on their problem list.  Patient reports sl discharge after taking atbx for UTI.  .  Contractions: Not present. Vag. Bleeding: None.  Movement: Present. Denies any leaking of fluid.   The following portions of the patient's history were reviewed and updated as appropriate: allergies, current medications, past family history, past medical history, past social history, past surgical history and problem list.   Objective:  There were no vitals filed for this visit.  Fetal Status:     Movement: Present     General:  Alert, oriented and cooperative. Patient is in no acute distress.  Respiratory: Normal respiratory effort, no problems with respiration noted  Mental Status: Normal mood and affect. Normal behavior. Normal judgment and thought content.  Rest of physical exam deferred due to type of encounter  Imaging: No results found.  Assessment and Plan:  Pregnancy: L2G4010 at  [redacted]w[redacted]d 1. Supervision of other normal pregnancy, antepartum Yeast infxn. Recommend OTC antifungal.  anatomy scan scheduled.  Needs AFP will come in on Friday.    2. Previous cesarean delivery, antepartum Plans for VBAC. S/p 3 successful VBACs.   3. Hyperthyroidism No meds at present  Preterm labor symptoms and general obstetric precautions including but not limited to vaginal bleeding, contractions, leaking of fluid and fetal movement were reviewed in detail with the patient. I discussed the assessment and treatment plan with the patient. The patient was provided an opportunity to ask questions and all were answered. The patient agreed with the plan and demonstrated an understanding of the instructions. The patient was advised to call back or seek an in-person office evaluation/go to MAU at [facility] for any urgent or concerning symptoms. Please refer to After Visit Summary for other counseling recommendations.   I provided 12 minutes of face-to-face time during this encounter.  Return in about 6 weeks (around 06/15/2019) for Web based.  Future Appointments  Date Time Provider [place]  05/12/2019  8:00 AM [facility]-MFC [place] 3 [facility]-MFCUS MFC-US    Edge Mauger Harraway[place]Smith, [place] for [facility], [place]

## 2019-05-06 ENCOUNTER — Other Ambulatory Visit: Payer: Self-pay

## 2019-05-06 ENCOUNTER — Other Ambulatory Visit: Payer: Medicaid Other

## 2019-05-06 DIAGNOSIS — O34219 Maternal care for unspecified type scar from previous cesarean delivery: Secondary | ICD-10-CM | POA: Diagnosis not present

## 2019-05-06 DIAGNOSIS — E0590 Thyrotoxicosis, unspecified without thyrotoxic crisis or storm: Secondary | ICD-10-CM | POA: Diagnosis not present

## 2019-05-06 DIAGNOSIS — Z3480 Encounter for supervision of other normal pregnancy, unspecified trimester: Secondary | ICD-10-CM | POA: Diagnosis not present

## 2019-05-09 LAB — AFP, SERUM, OPEN SPINA BIFIDA
AFP MoM: 0.77
AFP Value: 31.2 ng/mL
Gest. Age on Collection Date: 17.5 weeks
Maternal Age At EDD: 27.4 yr
OSBR Risk 1 IN: 10000
Test Results:: NEGATIVE
Weight: 146 [lb_av]

## 2019-05-12 ENCOUNTER — Other Ambulatory Visit (HOSPITAL_COMMUNITY): Payer: Self-pay | Admitting: *Deleted

## 2019-05-12 ENCOUNTER — Ambulatory Visit (HOSPITAL_COMMUNITY)
Admission: RE | Admit: 2019-05-12 | Discharge: 2019-05-12 | Disposition: A | Discharge status: Home / Self Care | Payer: Medicaid Other | Source: Ambulatory Visit | Attending: Obstetrics and Gynecology | Admitting: Obstetrics and Gynecology

## 2019-05-12 ENCOUNTER — Other Ambulatory Visit: Payer: Self-pay | Admitting: Family Medicine

## 2019-05-12 ENCOUNTER — Other Ambulatory Visit: Payer: Self-pay

## 2019-05-12 DIAGNOSIS — O34219 Maternal care for unspecified type scar from previous cesarean delivery: Secondary | ICD-10-CM | POA: Diagnosis not present

## 2019-05-12 DIAGNOSIS — O09892 Supervision of other high risk pregnancies, second trimester: Secondary | ICD-10-CM

## 2019-05-12 DIAGNOSIS — E0590 Thyrotoxicosis, unspecified without thyrotoxic crisis or storm: Secondary | ICD-10-CM

## 2019-05-12 DIAGNOSIS — Z3A19 19 weeks gestation of pregnancy: Secondary | ICD-10-CM

## 2019-05-12 DIAGNOSIS — O359XX0 Maternal care for (suspected) fetal abnormality and damage, unspecified, not applicable or unspecified: Secondary | ICD-10-CM

## 2019-05-12 DIAGNOSIS — O99282 Endocrine, nutritional and metabolic diseases complicating pregnancy, second trimester: Secondary | ICD-10-CM

## 2019-05-12 DIAGNOSIS — Q699 Polydactyly, unspecified: Secondary | ICD-10-CM

## 2019-05-12 DIAGNOSIS — Z7689 Persons encountering health services in other specified circumstances: Secondary | ICD-10-CM | POA: Diagnosis not present

## 2019-05-12 DIAGNOSIS — Z3480 Encounter for supervision of other normal pregnancy, unspecified trimester: Secondary | ICD-10-CM | POA: Diagnosis not present

## 2019-05-17 ENCOUNTER — Encounter: Payer: Self-pay | Admitting: General Practice

## 2019-06-08 DIAGNOSIS — B379 Candidiasis, unspecified: Secondary | ICD-10-CM

## 2019-06-09 ENCOUNTER — Other Ambulatory Visit (HOSPITAL_COMMUNITY): Payer: Self-pay | Admitting: *Deleted

## 2019-06-09 ENCOUNTER — Ambulatory Visit (HOSPITAL_COMMUNITY)
Admission: RE | Admit: 2019-06-09 | Discharge: 2019-06-09 | Disposition: A | Discharge status: Home / Self Care | Payer: Medicaid Other | Source: Ambulatory Visit | Attending: Obstetrics and Gynecology | Admitting: Obstetrics and Gynecology

## 2019-06-09 ENCOUNTER — Other Ambulatory Visit: Payer: Self-pay

## 2019-06-09 ENCOUNTER — Ambulatory Visit (HOSPITAL_COMMUNITY): Payer: Medicaid Other | Admitting: *Deleted

## 2019-06-09 ENCOUNTER — Encounter (HOSPITAL_COMMUNITY): Payer: Self-pay

## 2019-06-09 DIAGNOSIS — O09892 Supervision of other high risk pregnancies, second trimester: Secondary | ICD-10-CM

## 2019-06-09 DIAGNOSIS — Z3A22 22 weeks gestation of pregnancy: Secondary | ICD-10-CM

## 2019-06-09 DIAGNOSIS — E0590 Thyrotoxicosis, unspecified without thyrotoxic crisis or storm: Secondary | ICD-10-CM

## 2019-06-09 DIAGNOSIS — Q699 Polydactyly, unspecified: Secondary | ICD-10-CM | POA: Diagnosis not present

## 2019-06-09 DIAGNOSIS — O99282 Endocrine, nutritional and metabolic diseases complicating pregnancy, second trimester: Secondary | ICD-10-CM | POA: Diagnosis not present

## 2019-06-09 DIAGNOSIS — Z3480 Encounter for supervision of other normal pregnancy, unspecified trimester: Secondary | ICD-10-CM | POA: Insufficient documentation

## 2019-06-09 DIAGNOSIS — O34219 Maternal care for unspecified type scar from previous cesarean delivery: Secondary | ICD-10-CM | POA: Diagnosis not present

## 2019-06-09 DIAGNOSIS — O359XX0 Maternal care for (suspected) fetal abnormality and damage, unspecified, not applicable or unspecified: Secondary | ICD-10-CM | POA: Diagnosis not present

## 2019-06-09 DIAGNOSIS — Z362 Encounter for other antenatal screening follow-up: Secondary | ICD-10-CM

## 2019-06-09 MED ORDER — TERCONAZOLE 0.4 % VA CREA: 1.0000 | TOPICAL_CREAM | Freq: Every day | VAGINAL | 0 refills | Status: DC

## 2019-06-15 ENCOUNTER — Telehealth: Payer: Medicaid Other | Admitting: Obstetrics and Gynecology

## 2019-06-15 ENCOUNTER — Other Ambulatory Visit: Payer: Self-pay

## 2019-06-15 DIAGNOSIS — Z91199 Patient's noncompliance with other medical treatment and regimen due to unspecified reason: Secondary | ICD-10-CM

## 2019-06-15 DIAGNOSIS — Z5329 Procedure and treatment not carried out because of patient's decision for other reasons: Secondary | ICD-10-CM

## 2019-06-15 NOTE — Progress Notes (Signed)
@  105pm no answer LVM that I will try again in 20mins   @117pm  no LVM that I will attempt one more time and if I miss her to call the office back to reschedule  @126pm  lvm to call to reschedule appointment.

## 2019-06-21 ENCOUNTER — Telehealth: Payer: Self-pay | Admitting: Lactation Services

## 2019-06-21 ENCOUNTER — Inpatient Hospital Stay (HOSPITAL_COMMUNITY)
Admission: AD | Admit: 2019-06-21 | Discharge: 2019-06-21 | Disposition: A | Discharge status: Home / Self Care | Payer: Medicaid Other | Attending: Obstetrics and Gynecology | Admitting: Obstetrics and Gynecology

## 2019-06-21 ENCOUNTER — Other Ambulatory Visit: Payer: Self-pay

## 2019-06-21 ENCOUNTER — Encounter (HOSPITAL_COMMUNITY): Payer: Self-pay

## 2019-06-21 DIAGNOSIS — O98812 Other maternal infectious and parasitic diseases complicating pregnancy, second trimester: Secondary | ICD-10-CM | POA: Insufficient documentation

## 2019-06-21 DIAGNOSIS — O368120 Decreased fetal movements, second trimester, not applicable or unspecified: Secondary | ICD-10-CM | POA: Diagnosis not present

## 2019-06-21 DIAGNOSIS — Z3A24 24 weeks gestation of pregnancy: Secondary | ICD-10-CM | POA: Diagnosis not present

## 2019-06-21 DIAGNOSIS — B373 Candidiasis of vulva and vagina: Secondary | ICD-10-CM

## 2019-06-21 DIAGNOSIS — B3731 Acute candidiasis of vulva and vagina: Secondary | ICD-10-CM

## 2019-06-21 LAB — WET PREP, GENITAL
Clue Cells Wet Prep HPF POC: NONE SEEN
Sperm: NONE SEEN
Trich, Wet Prep: NONE SEEN

## 2019-06-21 LAB — URINALYSIS, ROUTINE W REFLEX MICROSCOPIC
Bilirubin Urine: NEGATIVE
Glucose, UA: NEGATIVE mg/dL
Hgb urine dipstick: NEGATIVE
Ketones, ur: 5 mg/dL — AB
Leukocytes,Ua: NEGATIVE
Nitrite: NEGATIVE
Protein, ur: NEGATIVE mg/dL
Specific Gravity, Urine: 1.021 (ref 1.005–1.030)
pH: 7 (ref 5.0–8.0)

## 2019-06-21 MED ORDER — TERCONAZOLE 0.8 % VA CREA: 1.0000 | TOPICAL_CREAM | Freq: Every day | VAGINAL | 0 refills | Status: DC

## 2019-06-21 NOTE — Telephone Encounter (Signed)
Attemped to call pt to inform her to go to MAU for evaluation. LM for pt to go and sent My Chart Message.

## 2019-06-21 NOTE — Discharge Instructions (Signed)
Vaginal Yeast infection, Adult  Vaginal yeast infection is a condition that causes vaginal discharge as well as soreness, swelling, and redness (inflammation) of the vagina. This is a common condition. Some women get this infection frequently. What are the causes? This condition is caused by a change in the normal balance of the yeast (candida) and bacteria that live in the vagina. This change causes an overgrowth of yeast, which causes the inflammation. What increases the risk? The condition is more likely to develop in women who:  Take antibiotic medicines.  Have diabetes.  Take birth control pills.  Are pregnant.  Douche often.  Have a weak body defense system (immune system).  Have been taking steroid medicines for a long time.  Frequently wear tight clothing. What are the signs or symptoms? Symptoms of this condition include:  White, thick, creamy vaginal discharge.  Swelling, itching, redness, and irritation of the vagina. The lips of the vagina (vulva) may be affected as well.  Pain or a burning feeling while urinating.  Pain during sex. How is this diagnosed? This condition is diagnosed based on:  Your medical history.  A physical exam.  A pelvic exam. Your health care provider will examine a sample of your vaginal discharge under a microscope. Your health care provider may send this sample for testing to confirm the diagnosis. How is this treated? This condition is treated with medicine. Medicines may be over-the-counter or prescription. You may be told to use one or more of the following:  Medicine that is taken by mouth (orally).  Medicine that is applied as a cream (topically).  Medicine that is inserted directly into the vagina (suppository). Follow these instructions at home:  Lifestyle  Do not have sex until your health care provider approves. Tell your sex partner that you have a yeast infection. That person should go to his or her health care  provider and ask if they should also be treated.  Do not wear tight clothes, such as pantyhose or tight pants.  Wear breathable cotton underwear. General instructions  Take or apply over-the-counter and prescription medicines only as told by your health care provider.  Eat more yogurt. This may help to keep your yeast infection from returning.  Do not use tampons until your health care provider approves.  Try taking a sitz bath to help with discomfort. This is a warm water bath that is taken while you are sitting down. The water should only come up to your hips and should cover your buttocks. Do this 3-4 times per day or as told by your health care provider.  Do not douche.  If you have diabetes, keep your blood sugar levels under control.  Keep all follow-up visits as told by your health care provider. This is important. Contact a health care provider if:  You have a fever.  Your symptoms go away and then return.  Your symptoms do not get better with treatment.  Your symptoms get worse.  You have new symptoms.  You develop blisters in or around your vagina.  You have blood coming from your vagina and it is not your menstrual period.  You develop pain in your abdomen. Summary  Vaginal yeast infection is a condition that causes discharge as well as soreness, swelling, and redness (inflammation) of the vagina.  This condition is treated with medicine. Medicines may be over-the-counter or prescription.  Take or apply over-the-counter and prescription medicines only as told by your health care provider.  Do not douche.   Do not have sex or use tampons until your health care provider approves.  Contact a health care provider if your symptoms do not get better with treatment or your symptoms go away and then return. This information is not intended to replace advice given to you by your health care provider. Make sure you discuss any questions you have with your health care  provider. Document Released: 07/02/2005 Document Revised: 02/08/2018 Document Reviewed: 02/08/2018 Elsevier Patient Education  2020 Elsevier Inc.  

## 2019-06-21 NOTE — MAU Provider Note (Signed)
Chief Complaint:  Decreased Fetal Movement and Vaginal Discharge   First Provider Initiated Contact with Patient 06/21/19 1701     HPI: Nicole Love is a 28 y.o. B9T9030 at 68w3dwho presents to maternity admissions reporting decreased fetal movement & vaginal discharge. Has noted decreased to no movement for the last 2 days. Since being in MAU has felt some movement but not as strong or "painful" as before.  Earlier today, after voiding, she looked in the toilet and saw a clump of white tissue that had a red pin-sized dot of red in the middle of it. Reports she thinks she is getting a yeast infection d/t irritation. Denies abdominal pain, dysuria, LOF, or recent intercourse.    Past Medical History:  Diagnosis Date  . Hyperthyroidism   . Pregnancy    OB History  Gravida Para Term Preterm AB Living  6 4 4   1 4   SAB TAB Ectopic Multiple Live Births  1     0 4    # Outcome Date GA Lbr Len/2nd Weight Sex Delivery Anes PTL Lv  6 Current           5 Term 05/17/18 475w2d8:35 / 00:05 3280 g F VBAC EPI  LIV  4 Term 12/15/16 408w0d600 g M VBAC None N LIV     Birth Comments: System Generated. Please review and update pregnancy details.  3 Term 01/10/14 42w5w0d17 g F Vag-Spont  N LIV  2 Term 12/12/10 42w059w0d3 g F CS-Unspec  N LIV     Birth Comments: failure to progress   1 SAB            Past Surgical History:  Procedure Laterality Date  . CESAREAN SECTION    . TUBAL LIGATION N/A 05/17/2018   Procedure: POST PARTUM TUBAL LIGATION;  Surgeon: ErvinChancy Milroy  [facility];  Laterality: N/A;   Family History  Problem Relation Age of Onset  . Diabetes Maternal Grandmother    Social History   Tobacco Use  . Smoking status: Never Smoker  . Smokeless tobacco: Never Used  Substance Use Topics  . Alcohol use: No  . Drug use: No   No Known Allergies Medications Prior to Admission  Medication Sig Dispense Refill Last Dose  .  AMBULATORY NON FORMULARY MEDICATION 1 Device by Other route once a week. Blood Pressure Cuff Medium Monitored Regularly at home ICD 10:Z34.90 1 kit 0 Past Week at Unknown time  . Prenatal Vit-Fe Fumarate-FA (PRENATAL PO) Take by mouth.   06/21/2019 at Unknown time  . prochlorperazine (COMPAZINE) 10 MG tablet Take 1 tablet (10 mg total) by mouth every 6 (six) hours as needed for nausea or vomiting. 30 tablet 3 Past Month at Unknown time  . scopolamine (TRANSDERM-SCOP, 1.5 MG,) 1 MG/3DAYS Place 1 patch (1.5 mg total) onto the skin every 3 (three) days. 10 patch 12 Past Month at Unknown time  . cephALEXin (KEFLEX) 500 MG capsule Take 1 capsule (500 mg total) by mouth 4 (four) times daily. (Patient not taking: Reported on 06/09/2019) 28 capsule 0   . fluconazole (DIFLUCAN) 150 MG tablet Take 1 tablet (150 mg total) by mouth daily. Repeat in 24 hours if needed (Patient not taking: Reported on 06/09/2019) 2 tablet 2   . metroNIDAZOLE (FLAGYL) 500 MG tablet Take 1 tablet (500 mg total) by mouth 2 (two) times daily. (Patient not taking: Reported on 06/09/2019) 14 tablet  0   . ondansetron (ZOFRAN-ODT) 4 MG disintegrating tablet Take 1 tablet (4 mg total) by mouth every 8 (eight) hours as needed for nausea or vomiting. (Patient not taking: Reported on 03/23/2019) 30 tablet 0   . terconazole (TERAZOL 7) 0.4 % vaginal cream Place 1 applicator vaginally at bedtime. (Patient not taking: Reported on 06/09/2019) 45 g 0     I have reviewed patient's Past Medical Hx, Surgical Hx, Family Hx, Social Hx, medications and allergies.   ROS:  Review of Systems  Constitutional: Negative.   Gastrointestinal: Negative.   Genitourinary: Positive for vaginal discharge. Negative for dysuria and vaginal bleeding.    Physical Exam   Patient Vitals for the past 24 hrs:  BP Temp Temp src Pulse Resp SpO2 Height Weight  06/21/19 1627 120/69 98 F (36.7 C) Oral 76 16 100 % 5' 6"  (1.676 m) 69.3 kg    Constitutional: Well-developed,  well-nourished female in no acute distress.  Cardiovascular: normal rate & rhythm, no murmur Respiratory: normal effort, lung sounds clear throughout GI: Abd soft, non-tender, gravid appropriate for gestational age. Pos BS x 4 MS: Extremities nontender, no edema, normal ROM Neurologic: Alert and oriented x 4.  GU:      Pelvic: moderate amount of white clumpy discharge. No blood.   Dilation: Closed Effacement (%): Thick Exam by:: Jorje Guild NP  NST:  Baseline: 150 bpm, Variability: Good {> 6 bpm), Accelerations: Non-reactive but appropriate for gestational age and Decelerations: Absent   Labs: Results for orders placed or performed during the hospital encounter of 06/21/19 (from the past 24 hour(s))  Urinalysis, Routine w reflex microscopic     Status: Abnormal   Collection Time: 06/21/19  4:39 PM  Result Value Ref Range   Color, Urine YELLOW YELLOW   APPearance CLEAR CLEAR   Specific Gravity, Urine 1.021 1.005 - 1.030   pH 7.0 5.0 - 8.0   Glucose, UA NEGATIVE NEGATIVE mg/dL   Hgb urine dipstick NEGATIVE NEGATIVE   Bilirubin Urine NEGATIVE NEGATIVE   Ketones, ur 5 (A) NEGATIVE mg/dL   Protein, ur NEGATIVE NEGATIVE mg/dL   Nitrite NEGATIVE NEGATIVE   Leukocytes,Ua NEGATIVE NEGATIVE  Wet prep, genital     Status: Abnormal   Collection Time: 06/21/19  5:24 PM   Specimen: Cervix  Result Value Ref Range   Yeast Wet Prep HPF POC PRESENT (A) NONE SEEN   Trich, Wet Prep NONE SEEN NONE SEEN   Clue Cells Wet Prep HPF POC NONE SEEN NONE SEEN   WBC, Wet Prep HPF POC MANY (A) NONE SEEN   Sperm NONE SEEN     Imaging:  No results found.  MAU Course: Orders Placed This Encounter  Procedures  . Wet prep, genital  . Urinalysis, Routine w reflex microscopic  . Discharge patient   Meds ordered this encounter  Medications  . terconazole (TERAZOL 3) 0.8 % vaginal cream    Sig: Place 1 applicator vaginally at bedtime.    Dispense:  20 g    Refill:  0    Order Specific Question:    Supervising Provider    Answer:   Truett Mainland [[id number]]    MDM: Fetal tracing appropriate for gestation. Movement heard & palpated. Pt reports movement in MAU  Spec exam performed. Moderate amount of clumpy white discharge c/w yeast infection. Wet prep collected Cervix closed/thick  Wet prep + yeast Assessment: 1. Vaginal yeast infection   2. Decreased fetal movements in second trimester, single or unspecified fetus  3. [redacted] weeks gestation of pregnancy     Plan: Discharge home in stable condition.  Discussed reasons to return to MAU Rx terazol  Follow-up Information    [facility] Follow up.   Specialty: Obstetrics and Gynecology Why: return for worsening symptoms Contact information: [address] [id number] [place] [phone]          Allergies as of 06/21/2019   No Known Allergies     Medication List    STOP taking these medications   cephALEXin 500 MG capsule Commonly known as: KEFLEX   fluconazole 150 MG tablet Commonly known as: Diflucan   metroNIDAZOLE 500 MG tablet Commonly known as: FLAGYL   ondansetron 4 MG disintegrating tablet Commonly known as: ZOFRAN-ODT   terconazole 0.4 % vaginal cream Commonly known as: TERAZOL 7 Replaced by: terconazole 0.8 % vaginal cream     TAKE these medications   AMBULATORY NON FORMULARY MEDICATION 1 Device by Other route once a week. Blood Pressure Cuff Medium Monitored Regularly at home ICD 10:Z34.90   PRENATAL PO Take by mouth.   prochlorperazine 10 MG tablet Commonly known as: COMPAZINE Take 1 tablet (10 mg total) by mouth every 6 (six) hours as needed for nausea or vomiting.   scopolamine 1 MG/3DAYS Commonly known as: Transderm-Scop (1.5 MG) Place 1 patch (1.5 mg total) onto the skin every 3 (three) days.   terconazole 0.8 % vaginal cream Commonly known as: Terazol 3 Place 1 applicator vaginally at bedtime. Replaces: terconazole 0.4 %  vaginal cream       Jorje Guild, NP 06/21/2019 5:47 PM

## 2019-06-21 NOTE — MAU Note (Signed)
Been 2 days since she felt the baby move.  Passed a quarter sized clot of tissue with blood in the center. Denies any pain. No bleeding.

## 2019-06-28 ENCOUNTER — Telehealth: Payer: Self-pay | Admitting: Obstetrics & Gynecology

## 2019-06-28 NOTE — Telephone Encounter (Signed)
Attempted to call patient about her appointment on 9/23 @ 11:15. No answer and could not leave a message because the number stated that the call could not be completed at this time.

## 2019-06-29 ENCOUNTER — Encounter: Payer: Self-pay | Admitting: Obstetrics & Gynecology

## 2019-06-29 ENCOUNTER — Telehealth: Payer: Medicaid Other | Admitting: Obstetrics & Gynecology

## 2019-06-29 ENCOUNTER — Other Ambulatory Visit: Payer: Self-pay

## 2019-06-29 DIAGNOSIS — Z3480 Encounter for supervision of other normal pregnancy, unspecified trimester: Secondary | ICD-10-CM

## 2019-06-29 NOTE — Progress Notes (Signed)
@  1124am message stating that the call cannot be completed as dialed please check the number and call again. @1132am  call both numbers on file got a message stating the call couldn't be completed as dialed to check the number and dial again will send mychart message to patient.

## 2019-06-29 NOTE — Progress Notes (Signed)
   Patient was not available today for her scheduled virtual appointment.   Mckenna Gamm, MD, FACOG Obstetrician & Gynecologist, [facility], [facility]  

## 2019-07-13 ENCOUNTER — Telehealth: Payer: Medicaid Other | Admitting: Family Medicine

## 2019-07-14 ENCOUNTER — Ambulatory Visit (HOSPITAL_COMMUNITY)
Admission: RE | Admit: 2019-07-14 | Discharge: 2019-07-14 | Disposition: A | Discharge status: Home / Self Care | Payer: Medicaid Other | Source: Ambulatory Visit | Attending: Obstetrics and Gynecology | Admitting: Obstetrics and Gynecology

## 2019-07-14 ENCOUNTER — Other Ambulatory Visit: Payer: Self-pay

## 2019-07-14 ENCOUNTER — Other Ambulatory Visit (HOSPITAL_COMMUNITY): Payer: Self-pay | Admitting: *Deleted

## 2019-07-14 ENCOUNTER — Ambulatory Visit (HOSPITAL_COMMUNITY): Payer: Medicaid Other | Admitting: *Deleted

## 2019-07-14 ENCOUNTER — Encounter (HOSPITAL_COMMUNITY): Payer: Self-pay

## 2019-07-14 VITALS — BP 116/70 | HR 73 | Temp 98.4°F

## 2019-07-14 DIAGNOSIS — O99282 Endocrine, nutritional and metabolic diseases complicating pregnancy, second trimester: Secondary | ICD-10-CM | POA: Insufficient documentation

## 2019-07-14 DIAGNOSIS — O09892 Supervision of other high risk pregnancies, second trimester: Secondary | ICD-10-CM

## 2019-07-14 DIAGNOSIS — Z3480 Encounter for supervision of other normal pregnancy, unspecified trimester: Secondary | ICD-10-CM

## 2019-07-14 DIAGNOSIS — E0590 Thyrotoxicosis, unspecified without thyrotoxic crisis or storm: Secondary | ICD-10-CM

## 2019-07-14 DIAGNOSIS — O34219 Maternal care for unspecified type scar from previous cesarean delivery: Secondary | ICD-10-CM

## 2019-07-14 DIAGNOSIS — Z3A27 27 weeks gestation of pregnancy: Secondary | ICD-10-CM

## 2019-07-14 DIAGNOSIS — O359XX0 Maternal care for (suspected) fetal abnormality and damage, unspecified, not applicable or unspecified: Secondary | ICD-10-CM | POA: Diagnosis not present

## 2019-07-14 DIAGNOSIS — Z362 Encounter for other antenatal screening follow-up: Secondary | ICD-10-CM | POA: Diagnosis not present

## 2019-07-20 ENCOUNTER — Encounter: Payer: Medicaid Other | Admitting: Obstetrics and Gynecology

## 2019-07-20 ENCOUNTER — Encounter: Payer: Self-pay | Admitting: Family Medicine

## 2019-07-20 ENCOUNTER — Other Ambulatory Visit: Payer: Medicaid Other

## 2019-07-27 ENCOUNTER — Other Ambulatory Visit: Payer: Self-pay | Admitting: Lactation Services

## 2019-07-27 DIAGNOSIS — O0990 Supervision of high risk pregnancy, unspecified, unspecified trimester: Secondary | ICD-10-CM

## 2019-08-03 ENCOUNTER — Other Ambulatory Visit: Payer: Medicaid Other

## 2019-08-03 ENCOUNTER — Encounter: Payer: Self-pay | Admitting: Obstetrics and Gynecology

## 2019-08-03 ENCOUNTER — Other Ambulatory Visit: Payer: Self-pay

## 2019-08-03 ENCOUNTER — Ambulatory Visit (INDEPENDENT_AMBULATORY_CARE_PROVIDER_SITE_OTHER): Payer: Medicaid Other | Admitting: Obstetrics and Gynecology

## 2019-08-03 VITALS — BP 114/73 | HR 73 | Temp 98.6°F | Wt 157.7 lb

## 2019-08-03 DIAGNOSIS — O0990 Supervision of high risk pregnancy, unspecified, unspecified trimester: Secondary | ICD-10-CM | POA: Diagnosis not present

## 2019-08-03 DIAGNOSIS — Z23 Encounter for immunization: Secondary | ICD-10-CM | POA: Diagnosis not present

## 2019-08-03 DIAGNOSIS — O99280 Endocrine, nutritional and metabolic diseases complicating pregnancy, unspecified trimester: Secondary | ICD-10-CM

## 2019-08-03 DIAGNOSIS — Z3A30 30 weeks gestation of pregnancy: Secondary | ICD-10-CM

## 2019-08-03 DIAGNOSIS — Z98891 History of uterine scar from previous surgery: Secondary | ICD-10-CM | POA: Insufficient documentation

## 2019-08-03 DIAGNOSIS — E079 Disorder of thyroid, unspecified: Secondary | ICD-10-CM | POA: Diagnosis not present

## 2019-08-03 DIAGNOSIS — O99283 Endocrine, nutritional and metabolic diseases complicating pregnancy, third trimester: Secondary | ICD-10-CM | POA: Diagnosis not present

## 2019-08-03 DIAGNOSIS — Z9851 Tubal ligation status: Secondary | ICD-10-CM | POA: Insufficient documentation

## 2019-08-03 DIAGNOSIS — O34219 Maternal care for unspecified type scar from previous cesarean delivery: Secondary | ICD-10-CM | POA: Diagnosis not present

## 2019-08-03 DIAGNOSIS — Z3480 Encounter for supervision of other normal pregnancy, unspecified trimester: Secondary | ICD-10-CM

## 2019-08-03 DIAGNOSIS — Z7689 Persons encountering health services in other specified circumstances: Secondary | ICD-10-CM | POA: Diagnosis not present

## 2019-08-03 NOTE — Progress Notes (Signed)
   PRENATAL VISIT NOTE  Subjective:  Nicole Love is a 28 y.o. [id number] at [redacted]w[redacted]d being seen today for ongoing prenatal care.  She is currently monitored for the following issues for this high-risk pregnancy and has Hyperthyroidism; Previous cesarean delivery, antepartum; Thyroid disease affecting pregnancy; Supervision of other normal pregnancy, antepartum; Asymptomatic bacteriuria during pregnancy; History of VBAC; and S/P tubal ligation on their problem list.  Patient reports no complaints.  Contractions: Not present. Vag. Bleeding: None.  Movement: Present. Denies leaking of fluid.   The following portions of the patient's history were reviewed and updated as appropriate: allergies, current medications, past family history, past medical history, past social history, past surgical history and problem list.   Objective:   Vitals:   08/03/19 0842  BP: 114/73  Pulse: 73  Temp: 98.6 F (37 C)  Weight: 157 lb 11.2 oz (71.5 kg)    Fetal Status: Fetal Heart Rate (bpm): 152   Movement: Present     General:  Alert, oriented and cooperative. Patient is in no acute distress.  Skin: Skin is warm and dry. No rash noted.   Cardiovascular: Normal heart rate noted  Respiratory: Normal respiratory effort, no problems with respiration noted  Abdomen: Soft, gravid, appropriate for gestational age.  Pain/Pressure: Absent     Pelvic: Cervical exam deferred        Extremities: Normal range of motion.  Edema: None  Mental Status: Normal mood and affect. Normal behavior. Normal judgment and thought content.   Assessment and Plan:  Pregnancy: [id number] at [redacted]w[redacted]d  1. Supervision of other normal pregnancy, antepartum Reviewed options for contraception, gave info for IUD, Nexplanon, patient not interested in repeat BTL  2. Previous cesarean delivery, antepartum Reviewed risks/benefits of TOLAC versus RCS in detail. Patient counseled regarding potential vaginal delivery, chance of success, future  implications, possible uterine rupture and need for urgent/emergent repeat cesarean. Counseled regarding potential need for repeat c-section for reasons unrelated to first c-section. Counseled regarding scheduled repeat cesarean including risks of bleeding, infection, damage to surrounding tissue, abnormal placentation, implications for future pregnancies. All questions answered.  Patient desires TOLAC, consent signed 08/03/2019.  3. Thyroid disease affecting pregnancy No meds Recheck TSH, T3, T4 today  4. History of VBAC x3   5. S/P tubal ligation Failed BTL   Preterm labor symptoms and general obstetric precautions including but not limited to vaginal bleeding, contractions, leaking of fluid and fetal movement were reviewed in detail with the patient. Please refer to After Visit Summary for other counseling recommendations.   Return in about 2 weeks (around 08/17/2019) for low OB, virtual.  Future Appointments  Date Time Provider [place]  08/03/2019  9:35 AM Sloan Leiter, MD [facility] [place]  08/17/2019 10:55 AM Sloan Leiter, MD [facility] [place]  08/31/2019  8:45 AM [place] NURSE [place] [facility]  08/31/2019  8:45 AM [place] 2 [facility]  08/31/2019 10:55 AM [place] Jude, MD [facility]    Sloan Leiter, MD

## 2019-08-03 NOTE — Patient Instructions (Signed)
Use the following websites (and others) to help learn more about your contraception options and find the method that is right for you!  - The Centers for Disease Control (CDC) website: https://www.cdc.gov/reproductivehealth/contraception/index.htm  - [facility] website: https://www.plannedparenthood.org/learn/birth-control  - [employer]org: https://www.bedsider.org/methods   Contraception Choices Contraception, also called birth control, refers to methods or devices that prevent pregnancy. Hormonal methods Contraceptive implant A contraceptive implant is a thin, plastic tube that contains a hormone. It is inserted into the upper part of the arm. It can remain in place for up to 3 years. Progestin-only injections Progestin-only injections are injections of progestin, a synthetic form of the hormone progesterone. They are given every 3 months by a health care provider. Birth control pills Birth control pills are pills that contain hormones that prevent pregnancy. They must be taken once a day, preferably at the same time each day. Birth control patch The birth control patch contains hormones that prevent pregnancy. It is placed on the skin and must be changed once a week for three weeks and removed on the fourth week. A prescription is needed to use this method of contraception. Vaginal ring A vaginal ring contains hormones that prevent pregnancy. It is placed in the vagina for three weeks and removed on the fourth week. After that, the process is repeated with a new ring. A prescription is needed to use this method of contraception. Emergency contraceptive Emergency contraceptives prevent pregnancy after unprotected sex. They come in pill form and can be taken up to 5 days after sex. They work best the sooner they are taken after having sex. Most emergency contraceptives are available without a prescription. This method should not be used as your only form of birth control. Barrier  methods Female condom A female condom is a thin sheath that is worn over the penis during sex. Condoms keep sperm from going inside a woman's body. They can be used with a spermicide to increase their effectiveness. They should be disposed after a single use. Female condom A female condom is a soft, loose-fitting sheath that is put into the vagina before sex. The condom keeps sperm from going inside a woman's body. They should be disposed after a single use.  Intrauterine contraception Intrauterine device (IUD) An IUD is a T-shaped device that is put in a woman's uterus. There are two types:  Hormone IUD.This type contains progestin, a synthetic form of the hormone progesterone. This type can stay in place for 3-5 years.  Copper IUD.This type is wrapped in copper wire. It can stay in place for 10 years.  Permanent methods of contraception Female tubal ligation In this method, a woman's fallopian tubes are sealed, tied, or blocked during surgery to prevent eggs from traveling to the uterus.  Female sterilization This is a procedure to tie off the tubes that carry sperm (vasectomy). After the procedure, the man can still ejaculate fluid (semen).  Summary  Contraception, also called birth control, means methods or devices that prevent pregnancy.  Hormonal methods of contraception include implants, injections, pills, patches, vaginal rings, and emergency contraceptives.  Barrier methods of contraception can include female condoms, female condoms, diaphragms, cervical caps, sponges, and spermicides.  There are two types of IUDs (intrauterine devices). An IUD can be put in a woman's uterus to prevent pregnancy for 3-5 years.  Permanent sterilization can be done through a procedure for males, females, or both. This information is not intended to replace advice given to you by your health care provider. Make sure   you discuss any questions you have with your health care provider. Document Released:  09/22/2005 Document Revised: 10/25/2016 Document Reviewed: 10/25/2016 Elsevier Interactive Patient Education  2018 [employer] Inc.  

## 2019-08-04 LAB — CBC
Hematocrit: 29.4 % — ABNORMAL LOW (ref 34.0–46.6)
Hemoglobin: 9.7 g/dL — ABNORMAL LOW (ref 11.1–15.9)
MCH: 30.4 pg (ref 26.6–33.0)
MCHC: 33 g/dL (ref 31.5–35.7)
MCV: 92 fL (ref 79–97)
Platelets: 185 10*3/uL (ref 150–450)
RBC: 3.19 x10E6/uL — ABNORMAL LOW (ref 3.77–5.28)
RDW: 12.4 % (ref 11.7–15.4)
WBC: 11.2 10*3/uL — ABNORMAL HIGH (ref 3.4–10.8)

## 2019-08-04 LAB — GLUCOSE TOLERANCE, 2 HOURS W/ 1HR
Glucose, 1 hour: 107 mg/dL (ref 65–179)
Glucose, 2 hour: 79 mg/dL (ref 65–152)
Glucose, Fasting: 77 mg/dL (ref 65–91)

## 2019-08-04 LAB — RPR: RPR Ser Ql: NONREACTIVE

## 2019-08-04 LAB — T3: T3, Total: 167 ng/dL (ref 71–180)

## 2019-08-04 LAB — T4: T4, Total: 10.8 ug/dL (ref 4.5–12.0)

## 2019-08-04 LAB — TSH: TSH: 0.953 u[IU]/mL (ref 0.450–4.500)

## 2019-08-04 LAB — HIV ANTIBODY (ROUTINE TESTING W REFLEX): HIV Screen 4th Generation wRfx: NONREACTIVE

## 2019-08-05 MED ORDER — FERROUS SULFATE 325 (65 FE) MG PO TABS: 325.0000 mg | ORAL_TABLET | Freq: Two times a day (BID) | ORAL | 1 refills | Status: DC

## 2019-08-17 ENCOUNTER — Encounter: Payer: Medicaid Other | Admitting: Obstetrics and Gynecology

## 2019-08-19 IMAGING — US US MFM OB DETAIL +14 WK
1 series · 13 of 28 positions shown · non-contrast
Comparison: none

[Series 1: us mfm ob detail +14 wk · 13 of 56 slices shown]
[im 3/56]
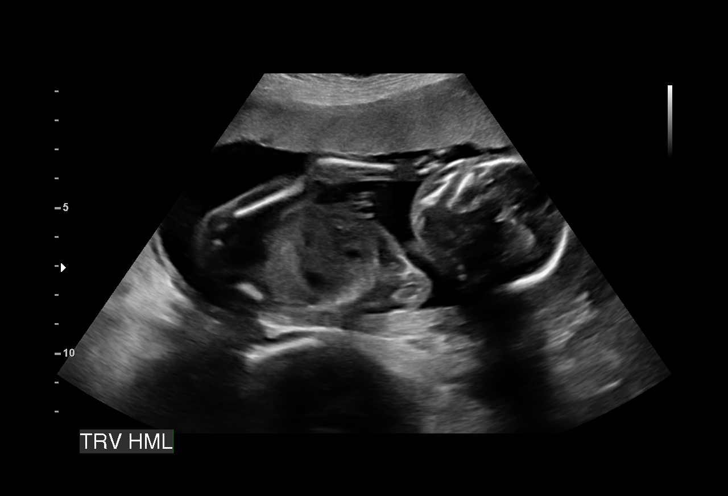
[im 7/56]
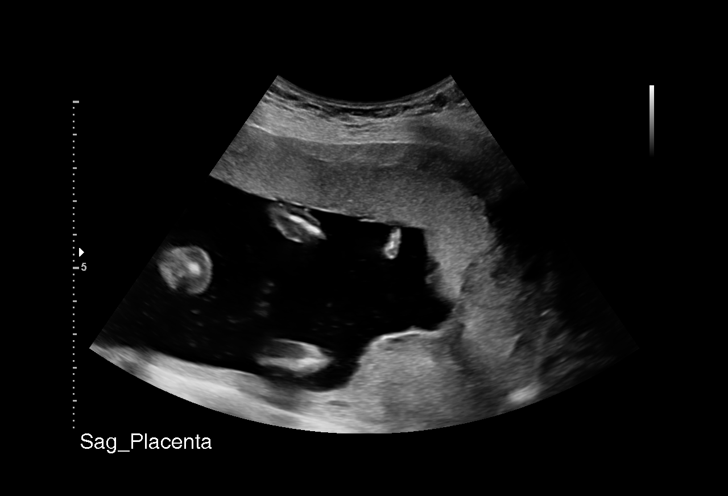
[im 11/56]
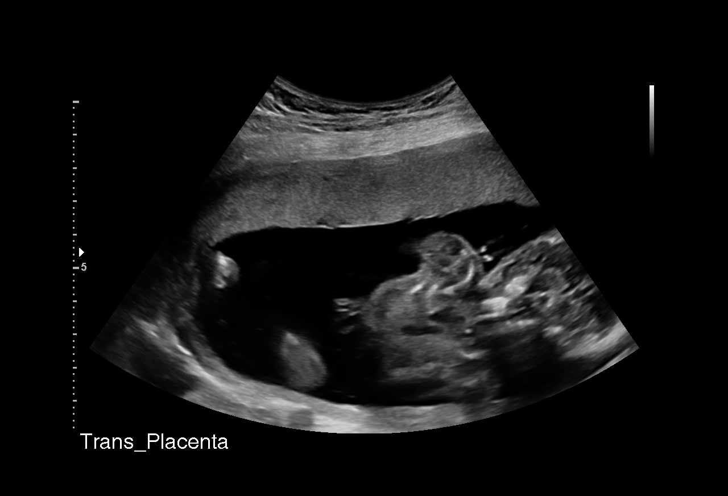
[im 15/56]
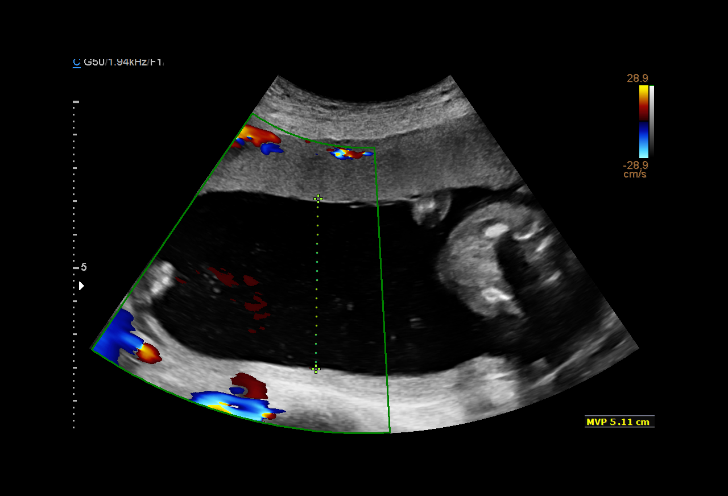
[im 19/56]
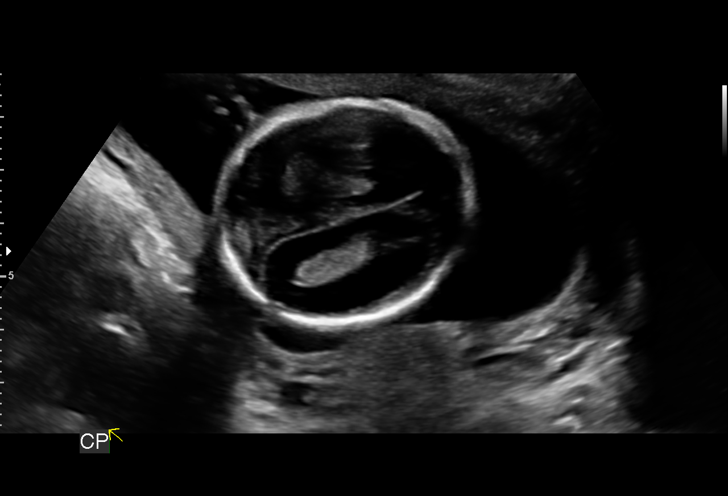
[im 23/56]
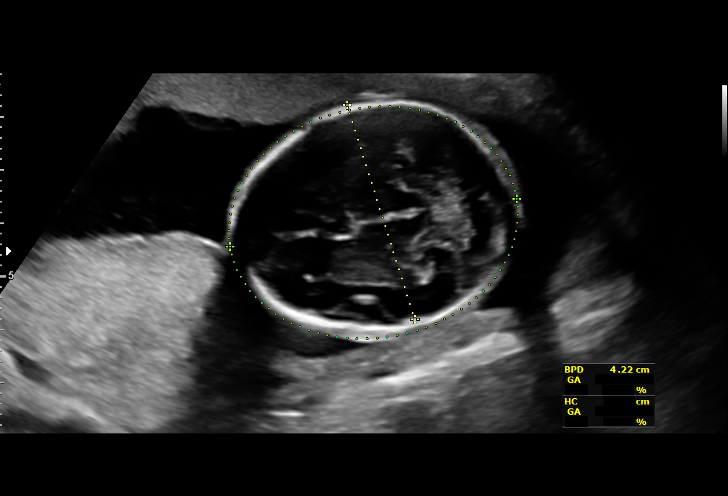
[im 29/56]
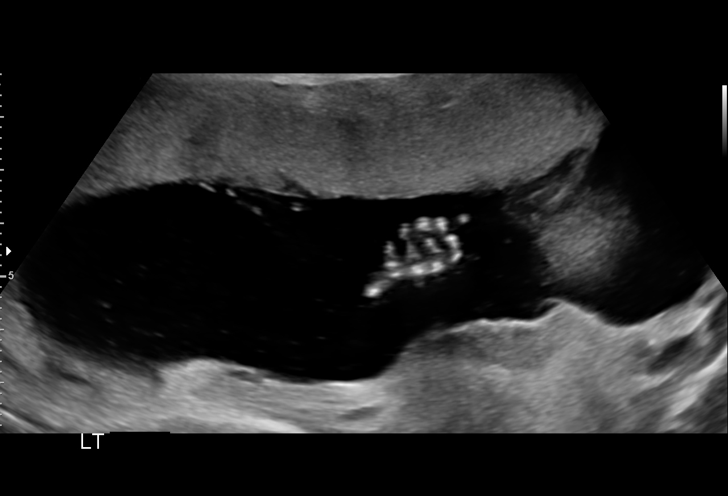
[im 33/56]
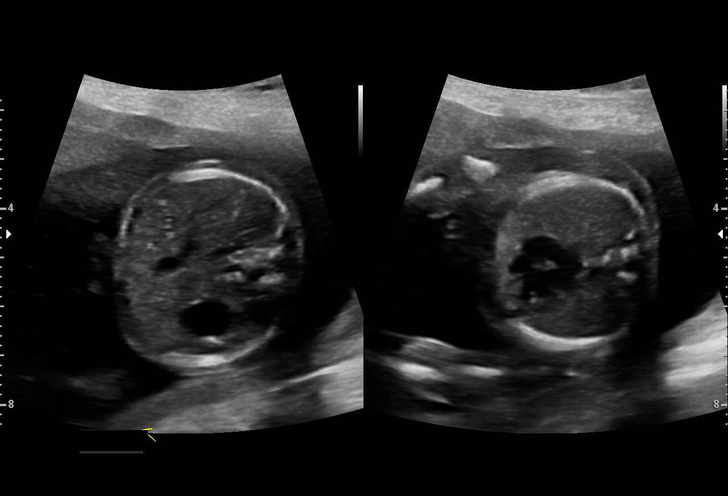
[im 37/56]
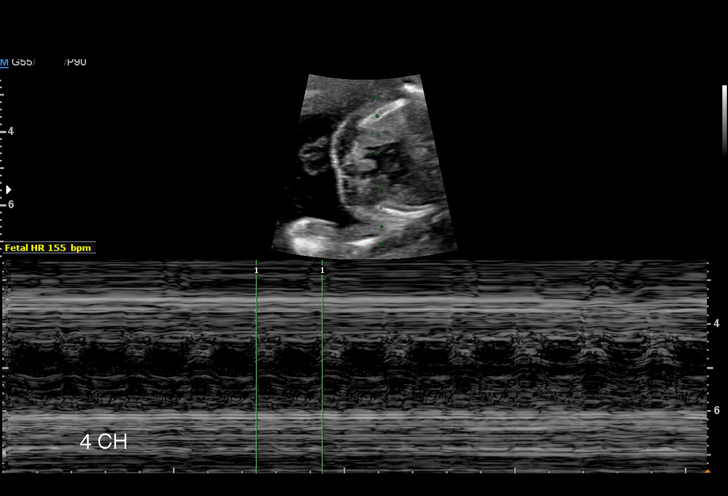
[im 41/56]
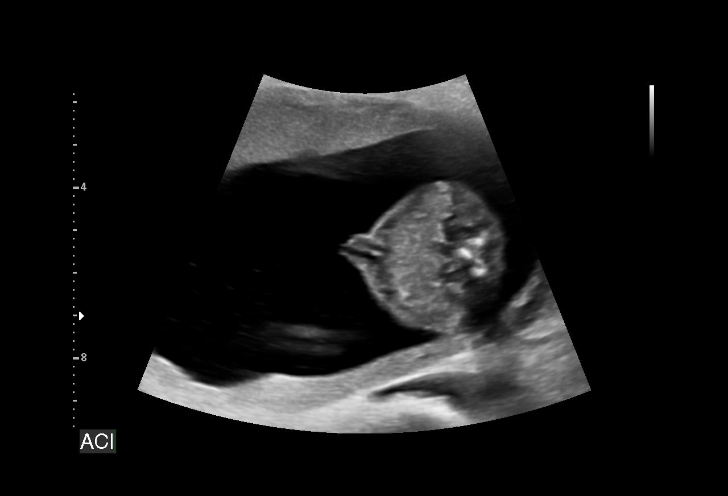
[im 45/56]
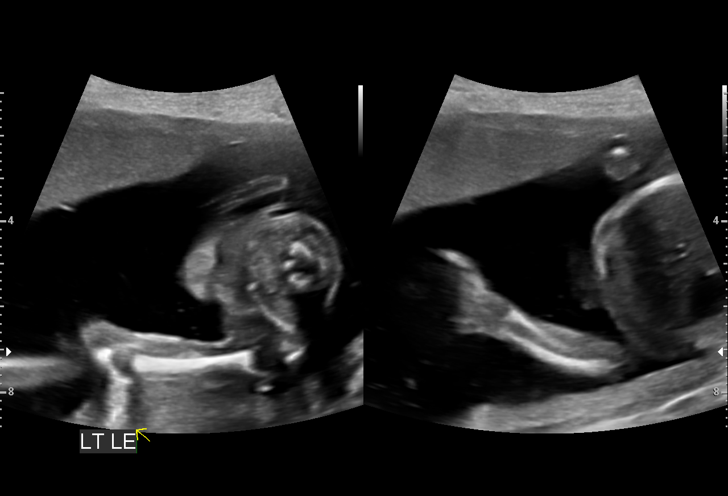
[im 49/56]
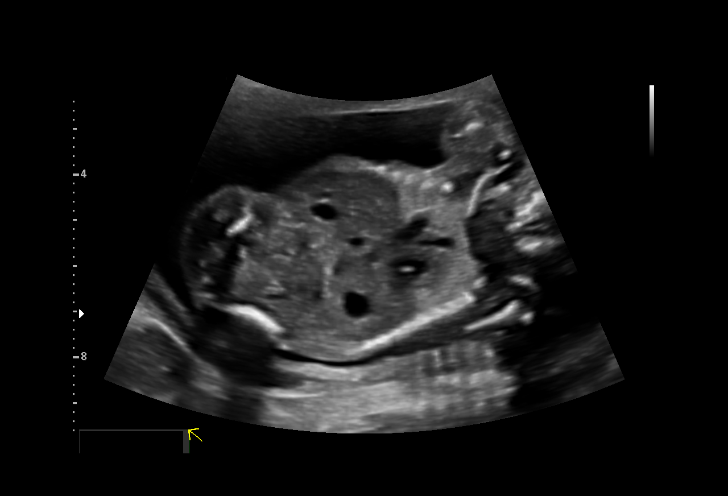
[im 53/56]
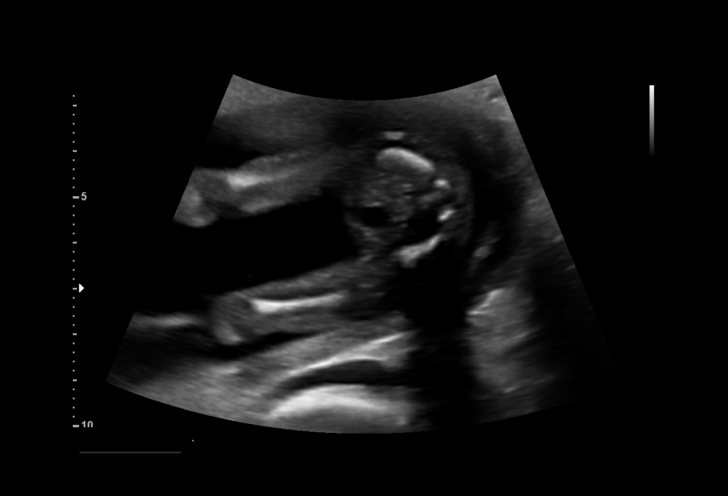

[13 of 28 positions shown; findings below may reference images not displayed]

[address]

 ----------------------------------------------------------------------

 ----------------------------------------------------------------------
Indications

  18 weeks gestation of pregnancy
  Encounter for antenatal screening for
  malformations (low risk NIPS, 3.D33)
  Previous cesarean delivery, antepartum x1,
  VBAC x3
  Short interval between pregancies, 2nd
  trimester
  Fetal abnormality - other known or
  suspected (Lt polydactyly)
  Hyperthyroid
 ----------------------------------------------------------------------
Fetal Evaluation

 Num Of Fetuses:         1
 Fetal Heart Rate(bpm):  155
 Cardiac Activity:       Observed
 Presentation:           Cephalic
 Placenta:               Anterior
 P. Cord Insertion:      Visualized, central

 Amniotic Fluid
 AFI FV:      Within normal limits

                             Largest Pocket(cm)

Biometry

 BPD:      42.1  mm     G. Age:  18w 5d         52  %    CI:        74.85   %    70 - 86
                                                         FL/HC:      19.1   %    16.1 -
 HC:      154.4  mm     G. Age:  18w 3d         27  %    HC/AC:      1.14        1.09 -
 AC:      134.9  mm     G. Age:  19w 0d         55  %    FL/BPD:     70.1   %
 FL:       29.5  mm     G. Age:  19w 1d         58  %    FL/AC:      21.9   %    20 - 24
 HUM:      26.3  mm     G. Age:  18w 2d         41  %
 CER:      17.7  mm     G. Age:  17w 5d         21  %
 NFT:       3.5  mm
 CM:        4.7  mm

 Est. FW:     266  gm      0 lb 9 oz     60  %
OB History

 Gravidity:    6         Term:   4        Prem:   0        SAB:   1
 TOP:          0       Ectopic:  0        Living: 4
Gestational Age

 U/S Today:     18w 6d                                        EDD:   10/07/19
 Best:          18w 5d     Det. By:  Early Exam  (02/10/19)   EDD:   10/08/19
Anatomy

 Cranium:               Appears normal         Aortic Arch:            Not well visualized
 Cavum:                 Appears normal         Ductal Arch:            Not well visualized
 Ventricles:            Appears normal         Diaphragm:              Appears normal
 Choroid Plexus:        Appears normal         Stomach:                Appears normal, left
                                                                       sided
 Cerebellum:            Appears normal         Abdomen:                Appears normal
 Posterior Fossa:       Appears normal         Abdominal Wall:         Appears nml (cord
                                                                       insert, abd wall)
 Nuchal Fold:           Appears normal         Cord Vessels:           Appears normal (3
                                                                       vessel cord)
 Face:                  Appears normal         Kidneys:                Appear normal
                        (orbits and profile)
 Lips:                  Appears normal         Bladder:                Appears normal
 Thoracic:              Appears normal         Spine:                  Not well visualized
 Heart:                 Not well visualized    Upper Extremities:      Polydactyly(lt
                                                                       hand)
 RVOT:                  Not well visualized    Lower Extremities:      Appears normal
 LVOT:                  Not well visualized

 Other:  Polydactyly on left hand. Rt hand and 5th digit.
Cervix Uterus Adnexa

 Cervix
 Length:              4  cm.
 Normal appearance by transabdominal scan.

 Uterus
 No abnormality visualized.

 Left Ovary
 No adnexal mass visualized.

 Right Ovary
 No adnexal mass visualized.

 Cul De Sac
 No free fluid seen.

 Adnexa
 No abnormality visualized.
Impression

 Ms. Theisen, Edgar6 P4, is here for fetal anatomy scan. On cell-
 free fetal DNA screening, the risks of aneuploidies were not
 increased.
 We performed fetal anatomy scan. Left postaxial polydactyly
 is seen. No makers of aneuploidies or other fetal structural
 defects are seen. Fetal biometry is consistent with her
 previously-established dates. Amniotic fluid is normal and
 good fetal activity is seen. Placenta is anterior and there is no
 evidence of previa or accreta.  Patient understands the
 limitations of ultrasound in detecting fetal anomalies.
 Obstetric history is significant for a term cesarean delivery
 followed by 3 VBACs.  She has hyperthyroidism that is stable
 without medications.
 Patient reports her previous children also had polydactyly and
 she is reassured that this heritable condition is not associated
 with any genetic syndromes.
Recommendations

 -An appointment was made for her to return in 4 weeks for
 completion of fetal anatomy.
                 Februarie, Zondie

## 2019-08-30 DIAGNOSIS — O219 Vomiting of pregnancy, unspecified: Secondary | ICD-10-CM

## 2019-08-30 DIAGNOSIS — B379 Candidiasis, unspecified: Secondary | ICD-10-CM

## 2019-08-31 ENCOUNTER — Ambulatory Visit (HOSPITAL_COMMUNITY): Payer: Medicaid Other | Admitting: *Deleted

## 2019-08-31 ENCOUNTER — Other Ambulatory Visit: Payer: Self-pay

## 2019-08-31 ENCOUNTER — Ambulatory Visit (HOSPITAL_COMMUNITY)
Admission: RE | Admit: 2019-08-31 | Discharge: 2019-08-31 | Disposition: A | Discharge status: Home / Self Care | Payer: Medicaid Other | Source: Ambulatory Visit | Attending: Obstetrics and Gynecology | Admitting: Obstetrics and Gynecology

## 2019-08-31 ENCOUNTER — Encounter: Payer: Medicaid Other | Admitting: Family Medicine

## 2019-08-31 ENCOUNTER — Other Ambulatory Visit (HOSPITAL_COMMUNITY): Payer: Self-pay | Admitting: *Deleted

## 2019-08-31 ENCOUNTER — Encounter (HOSPITAL_COMMUNITY): Payer: Self-pay

## 2019-08-31 DIAGNOSIS — Z3480 Encounter for supervision of other normal pregnancy, unspecified trimester: Secondary | ICD-10-CM

## 2019-08-31 DIAGNOSIS — E0590 Thyrotoxicosis, unspecified without thyrotoxic crisis or storm: Secondary | ICD-10-CM | POA: Insufficient documentation

## 2019-08-31 DIAGNOSIS — O99280 Endocrine, nutritional and metabolic diseases complicating pregnancy, unspecified trimester: Secondary | ICD-10-CM | POA: Diagnosis not present

## 2019-08-31 DIAGNOSIS — Z362 Encounter for other antenatal screening follow-up: Secondary | ICD-10-CM

## 2019-08-31 DIAGNOSIS — O34219 Maternal care for unspecified type scar from previous cesarean delivery: Secondary | ICD-10-CM

## 2019-08-31 DIAGNOSIS — O99283 Endocrine, nutritional and metabolic diseases complicating pregnancy, third trimester: Secondary | ICD-10-CM

## 2019-08-31 DIAGNOSIS — O359XX0 Maternal care for (suspected) fetal abnormality and damage, unspecified, not applicable or unspecified: Secondary | ICD-10-CM | POA: Diagnosis not present

## 2019-08-31 DIAGNOSIS — O09893 Supervision of other high risk pregnancies, third trimester: Secondary | ICD-10-CM

## 2019-08-31 DIAGNOSIS — Z3A34 34 weeks gestation of pregnancy: Secondary | ICD-10-CM

## 2019-09-05 MED ORDER — TERCONAZOLE 0.4 % VA CREA: 1.0000 | TOPICAL_CREAM | Freq: Every day | VAGINAL | 0 refills | Status: AC

## 2019-09-08 ENCOUNTER — Other Ambulatory Visit: Payer: Self-pay

## 2019-09-08 DIAGNOSIS — O219 Vomiting of pregnancy, unspecified: Secondary | ICD-10-CM

## 2019-09-09 MED ORDER — PROCHLORPERAZINE MALEATE 10 MG PO TABS: 10.0000 mg | ORAL_TABLET | Freq: Four times a day (QID) | ORAL | 0 refills | Status: DC | PRN

## 2019-09-12 ENCOUNTER — Telehealth: Payer: Self-pay | Admitting: Obstetrics & Gynecology

## 2019-09-12 ENCOUNTER — Encounter: Payer: Medicaid Other | Admitting: Obstetrics & Gynecology

## 2019-09-12 ENCOUNTER — Encounter: Payer: Self-pay | Admitting: Obstetrics & Gynecology

## 2019-09-12 NOTE — Telephone Encounter (Signed)
Attempted to contact patient on both numbers to get her rescheduled for her missed ob appointment. No answer on both numbers, both stated that the call called not be completed at this time. No show letter mailed

## 2019-09-21 ENCOUNTER — Ambulatory Visit (HOSPITAL_COMMUNITY): Payer: Medicaid Other | Admitting: *Deleted

## 2019-09-21 ENCOUNTER — Ambulatory Visit (HOSPITAL_COMMUNITY): Payer: Medicaid Other

## 2019-09-21 ENCOUNTER — Other Ambulatory Visit: Payer: Self-pay

## 2019-09-21 ENCOUNTER — Ambulatory Visit (HOSPITAL_COMMUNITY): Admission: RE | Admit: 2019-09-21 | Payer: Medicaid Other | Source: Ambulatory Visit

## 2019-09-21 ENCOUNTER — Ambulatory Visit (HOSPITAL_COMMUNITY)
Admission: RE | Admit: 2019-09-21 | Discharge: 2019-09-21 | Disposition: A | Discharge status: Home / Self Care | Payer: Medicaid Other | Source: Ambulatory Visit | Attending: Obstetrics | Admitting: Obstetrics

## 2019-09-21 ENCOUNTER — Encounter (HOSPITAL_COMMUNITY): Payer: Self-pay

## 2019-09-21 DIAGNOSIS — O99282 Endocrine, nutritional and metabolic diseases complicating pregnancy, second trimester: Secondary | ICD-10-CM | POA: Diagnosis not present

## 2019-09-21 DIAGNOSIS — Z362 Encounter for other antenatal screening follow-up: Secondary | ICD-10-CM

## 2019-09-21 DIAGNOSIS — Z3A37 37 weeks gestation of pregnancy: Secondary | ICD-10-CM

## 2019-09-21 DIAGNOSIS — O34219 Maternal care for unspecified type scar from previous cesarean delivery: Secondary | ICD-10-CM | POA: Diagnosis not present

## 2019-09-21 DIAGNOSIS — E0590 Thyrotoxicosis, unspecified without thyrotoxic crisis or storm: Secondary | ICD-10-CM

## 2019-09-21 DIAGNOSIS — O09892 Supervision of other high risk pregnancies, second trimester: Secondary | ICD-10-CM

## 2019-09-21 DIAGNOSIS — Z3480 Encounter for supervision of other normal pregnancy, unspecified trimester: Secondary | ICD-10-CM | POA: Insufficient documentation

## 2019-09-21 DIAGNOSIS — O99280 Endocrine, nutritional and metabolic diseases complicating pregnancy, unspecified trimester: Secondary | ICD-10-CM | POA: Insufficient documentation

## 2019-09-21 DIAGNOSIS — O359XX0 Maternal care for (suspected) fetal abnormality and damage, unspecified, not applicable or unspecified: Secondary | ICD-10-CM | POA: Diagnosis not present

## 2019-09-21 NOTE — Progress Notes (Signed)
f °

## 2019-09-26 ENCOUNTER — Ambulatory Visit (INDEPENDENT_AMBULATORY_CARE_PROVIDER_SITE_OTHER): Payer: Medicaid Other | Admitting: Obstetrics & Gynecology

## 2019-09-26 ENCOUNTER — Other Ambulatory Visit (HOSPITAL_COMMUNITY)
Admission: RE | Admit: 2019-09-26 | Discharge: 2019-09-26 | Disposition: A | Discharge status: Home / Self Care | Payer: Medicaid Other | Source: Ambulatory Visit | Attending: Obstetrics & Gynecology | Admitting: Obstetrics & Gynecology

## 2019-09-26 ENCOUNTER — Other Ambulatory Visit: Payer: Self-pay

## 2019-09-26 VITALS — BP 115/73 | HR 96 | Wt 166.0 lb

## 2019-09-26 DIAGNOSIS — E079 Disorder of thyroid, unspecified: Secondary | ICD-10-CM | POA: Diagnosis not present

## 2019-09-26 DIAGNOSIS — Z23 Encounter for immunization: Secondary | ICD-10-CM

## 2019-09-26 DIAGNOSIS — O34219 Maternal care for unspecified type scar from previous cesarean delivery: Secondary | ICD-10-CM | POA: Diagnosis not present

## 2019-09-26 DIAGNOSIS — Z3A39 39 weeks gestation of pregnancy: Secondary | ICD-10-CM

## 2019-09-26 DIAGNOSIS — Z9851 Tubal ligation status: Secondary | ICD-10-CM

## 2019-09-26 DIAGNOSIS — Z98891 History of uterine scar from previous surgery: Secondary | ICD-10-CM

## 2019-09-26 DIAGNOSIS — O99283 Endocrine, nutritional and metabolic diseases complicating pregnancy, third trimester: Secondary | ICD-10-CM | POA: Diagnosis not present

## 2019-09-26 DIAGNOSIS — O99280 Endocrine, nutritional and metabolic diseases complicating pregnancy, unspecified trimester: Secondary | ICD-10-CM

## 2019-09-26 DIAGNOSIS — Z3480 Encounter for supervision of other normal pregnancy, unspecified trimester: Secondary | ICD-10-CM | POA: Diagnosis not present

## 2019-09-26 NOTE — Progress Notes (Signed)
BTL consent form signed at today's visit.   Apolonio Schneiders RN 09/26/19

## 2019-09-27 ENCOUNTER — Encounter: Payer: Self-pay | Admitting: *Deleted

## 2019-09-28 LAB — CERVICOVAGINAL ANCILLARY ONLY
Chlamydia: NEGATIVE
Comment: NEGATIVE
Comment: NORMAL
Neisseria Gonorrhea: NEGATIVE

## 2019-09-30 LAB — CULTURE, BETA STREP (GROUP B ONLY): Strep Gp B Culture: NEGATIVE

## 2019-10-03 ENCOUNTER — Other Ambulatory Visit: Payer: Self-pay

## 2019-10-03 ENCOUNTER — Telehealth: Payer: Medicaid Other | Admitting: Nurse Practitioner

## 2019-10-03 ENCOUNTER — Encounter: Payer: Self-pay | Admitting: Family Medicine

## 2019-10-03 DIAGNOSIS — Z5329 Procedure and treatment not carried out because of patient's decision for other reasons: Secondary | ICD-10-CM

## 2019-10-03 DIAGNOSIS — Z91199 Patient's noncompliance with other medical treatment and regimen due to unspecified reason: Secondary | ICD-10-CM

## 2019-10-03 NOTE — Progress Notes (Signed)
Call cannot be completed as dialed on both numbers

## 2019-10-04 NOTE — Progress Notes (Signed)
   PRENATAL VISIT NOTE  Subjective:  Nicole Love is a 28 y.o. [id number] at [redacted]w[redacted]d being seen today for ongoing prenatal care.  She is currently monitored for the following issues for this low-risk pregnancy and has Hyperthyroidism; Previous cesarean delivery, antepartum; Thyroid disease affecting pregnancy; Supervision of other normal pregnancy, antepartum; Asymptomatic bacteriuria during pregnancy; History of VBAC; and S/P tubal ligation on their problem list.  Patient reports no complaints.  Contractions: Irritability. Vag. Bleeding: None.  Movement: Present. Denies leaking of fluid.   The following portions of the patient's history were reviewed and updated as appropriate: allergies, current medications, past family history, past medical history, past social history, past surgical history and problem list.   Objective:   Vitals:   09/26/19 1450  BP: 115/73  Pulse: 96  Weight: 166 lb (75.3 kg)    Fetal Status: Fetal Heart Rate (bpm): 148   Movement: Present     General:  Alert, oriented and cooperative. Patient is in no acute distress.  Skin: Skin is warm and dry. No rash noted.   Cardiovascular: Normal heart rate noted  Respiratory: Normal respiratory effort, no problems with respiration noted  Abdomen: Soft, gravid, appropriate for gestational age.  Pain/Pressure: Absent     Pelvic: Cervical exam deferred        Extremities: Normal range of motion.  Edema: None  Mental Status: Normal mood and affect. Normal behavior. Normal judgment and thought content.   Assessment and Plan:  Pregnancy: [id number] at [redacted]w[redacted]d 1. Previous cesarean delivery, antepartum  2. S/P tubal ligation - will plan for salpingectomy this time  3. History of VBAC   4. Supervision of other normal pregnancy, antepartum  - GBS rectovaginal culture - Cervicovaginal ancillary only( [place])  5. Thyroid disease affecting pregnancy   6. Need for influenza vaccination  - Flu Vaccine QUAD 36+ mos  IM  Term labor symptoms and general obstetric precautions including but not limited to vaginal bleeding, contractions, leaking of fluid and fetal movement were reviewed in detail with the patient. Please refer to After Visit Summary for other counseling recommendations.   Return in about 1 week (around 10/03/2019) for virtual.  No future appointments.  Emily Filbert, MD

## 2019-10-07 NOTE — L&D Delivery Note (Addendum)
Delivery Note At 11:37 AM a viable female was delivered via VBAC, Spontaneous (Presentation: Left Occiput Anterior).  APGAR: 9, 9; weight  .   Placenta status: Spontaneous, Intact.  Cord: 3 vessels with the following complications:   Anesthesia: Epidural Episiotomy: None Lacerations: None Est. Blood Loss (mL): 50  Mom to postpartum.  Baby to Couplet care / Skin to Skin.  Felipa Eth, SNM 10/14/2019, 12:04 PM    OB/GYN Faculty Practice Delivery Note  Nicole Love is a 29 y.o. [id number] s/p SVD at [redacted]w[redacted]d. She was admitted for post dates induction.   ROM: 2h 75m with small amount fluid GBS Status:  Negative/-- (12/21 1613)  Labor Progress: Patient presented to L&D for IOL for post dates. Initial SVE: 1.5 . She then progressed to complete with pitocin and AROM.   Delivery Date/Time: 10/14/2019 @ 1137 Delivery: Called to room and patient was complete and pushing. Head delivered without difficulty. No nuchal cord present. Shoulder delivered with gentle downward traction and body delivered in usual fashion. Infant with spontaneous cry, placed on mother's abdomen, dried and stimulated. Cord clamped x 2 after 1-minute delay, and cut by medical student. Cord blood drawn. Placenta delivered spontaneously with gentle cord traction. 3 VC. Fundus firm with massage and Pitocin. Labia, perineum, vagina, and cervix inspected inspected, no lacerations present    Placenta: Sent to L&D Complications: None Lacerations: none EBL: 50 Analgesia: Epidural   Infant: APGAR (1 MIN): 9   APGAR (5 MINS): 9   APGAR (10 MINS):  n/a   Sherlyn Lees, SNM  10/14/2019, 12:00 PM    I confirm that I have verified the information documented in the nurse midwife student's note and that I have also personally reperformed the history, physical exam and all medical decision making activities of this service and have verified that all service and findings are accurately documented in this student's note.  I  attest that I was gowned and gloved for the delivery of infant and placenta and I agree with the above documentation and findings.    Marylene Land, CNM 10/14/2019 12:43 PM

## 2019-10-10 ENCOUNTER — Encounter: Payer: Self-pay | Admitting: Family Medicine

## 2019-10-12 ENCOUNTER — Ambulatory Visit: Payer: Self-pay

## 2019-10-12 ENCOUNTER — Ambulatory Visit (INDEPENDENT_AMBULATORY_CARE_PROVIDER_SITE_OTHER): Payer: Medicaid Other | Admitting: *Deleted

## 2019-10-12 ENCOUNTER — Other Ambulatory Visit: Payer: Self-pay | Admitting: Advanced Practice Midwife

## 2019-10-12 ENCOUNTER — Other Ambulatory Visit: Payer: Self-pay

## 2019-10-12 ENCOUNTER — Ambulatory Visit (INDEPENDENT_AMBULATORY_CARE_PROVIDER_SITE_OTHER): Payer: Medicaid Other | Admitting: Medical

## 2019-10-12 ENCOUNTER — Encounter: Payer: Self-pay | Admitting: Medical

## 2019-10-12 VITALS — BP 111/66 | HR 84 | Wt 167.0 lb

## 2019-10-12 DIAGNOSIS — O0990 Supervision of high risk pregnancy, unspecified, unspecified trimester: Secondary | ICD-10-CM

## 2019-10-12 DIAGNOSIS — O480 Post-term pregnancy: Secondary | ICD-10-CM | POA: Diagnosis not present

## 2019-10-12 DIAGNOSIS — Z98891 History of uterine scar from previous surgery: Secondary | ICD-10-CM

## 2019-10-12 DIAGNOSIS — Z3A40 40 weeks gestation of pregnancy: Secondary | ICD-10-CM | POA: Diagnosis not present

## 2019-10-12 DIAGNOSIS — E0590 Thyrotoxicosis, unspecified without thyrotoxic crisis or storm: Secondary | ICD-10-CM

## 2019-10-12 DIAGNOSIS — O34219 Maternal care for unspecified type scar from previous cesarean delivery: Secondary | ICD-10-CM

## 2019-10-12 DIAGNOSIS — Z9851 Tubal ligation status: Secondary | ICD-10-CM

## 2019-10-12 DIAGNOSIS — O0993 Supervision of high risk pregnancy, unspecified, third trimester: Secondary | ICD-10-CM

## 2019-10-12 DIAGNOSIS — Z3480 Encounter for supervision of other normal pregnancy, unspecified trimester: Secondary | ICD-10-CM

## 2019-10-12 NOTE — Patient Instructions (Signed)
Fetal Movement Counts Patient Name: ________________________________________________ Patient Due Date: ____________________ What is a fetal movement count?  A fetal movement count is the number of times that you feel your baby move during a certain amount of time. This may also be called a fetal kick count. A fetal movement count is recommended for every pregnant woman. You may be asked to start counting fetal movements as early as week 28 of your pregnancy. Pay attention to when your baby is most active. You may notice your baby's sleep and wake cycles. You may also notice things that make your baby move more. You should do a fetal movement count:  When your baby is normally most active.  At the same time each day. A good time to count movements is while you are resting, after having something to eat and drink. How do I count fetal movements? 1. Find a quiet, comfortable area. Sit, or lie down on your side. 2. Write down the date, the start time and stop time, and the number of movements that you felt between those two times. Take this information with you to your health care visits. 3. Write down your start time when you feel the first movement. 4. Count kicks, flutters, swishes, rolls, and jabs. You should feel at least 10 movements. 5. You may stop counting after you have felt 10 movements, or if you have been counting for 2 hours. Write down the stop time. 6. If you do not feel 10 movements in 2 hours, contact your health care provider for further instructions. Your health care provider may want to do additional tests to assess your baby's well-being. Contact a health care provider if:  You feel fewer than 10 movements in 2 hours.  Your baby is not moving like he or she usually does. Date: ____________ Start time: ____________ Stop time: ____________ Movements: ____________ Date: ____________ Start time: ____________ Stop time: ____________ Movements: ____________ Date: ____________  Start time: ____________ Stop time: ____________ Movements: ____________ Date: ____________ Start time: ____________ Stop time: ____________ Movements: ____________ Date: ____________ Start time: ____________ Stop time: ____________ Movements: ____________ Date: ____________ Start time: ____________ Stop time: ____________ Movements: ____________ Date: ____________ Start time: ____________ Stop time: ____________ Movements: ____________ Date: ____________ Start time: ____________ Stop time: ____________ Movements: ____________ Date: ____________ Start time: ____________ Stop time: ____________ Movements: ____________ This information is not intended to replace advice given to you by your health care provider. Make sure you discuss any questions you have with your health care provider. Document Revised: 05/12/2019 Document Reviewed: 05/12/2019 Elsevier Patient Education  2020 [employer] Braxton Hicks Contractions Contractions of the uterus can occur throughout pregnancy, but they are not always a sign that you are in labor. You may have practice contractions called Braxton Hicks contractions. These false labor contractions are sometimes confused with true labor. What are Braxton Hicks contractions? Braxton Hicks contractions are tightening movements that occur in the muscles of the uterus before labor. Unlike true labor contractions, these contractions do not result in opening (dilation) and thinning of the cervix. Toward the end of pregnancy (32-34 weeks), Braxton Hicks contractions can happen more often and may become stronger. These contractions are sometimes difficult to tell apart from true labor because they can be very uncomfortable. You should not feel embarrassed if you go to the hospital with false labor. Sometimes, the only way to tell if you are in true labor is for your health care provider to look for changes in the cervix. The health care provider   will do a physical exam and may  monitor your contractions. If you are not in true labor, the exam should show that your cervix is not dilating and your water has not broken. If there are no other health problems associated with your pregnancy, it is completely safe for you to be sent home with false labor. You may continue to have Braxton Hicks contractions until you go into true labor. How to tell the difference between true labor and false labor True labor  Contractions last 30-70 seconds.  Contractions become very regular.  Discomfort is usually felt in the top of the uterus, and it spreads to the lower abdomen and low back.  Contractions do not go away with walking.  Contractions usually become more intense and increase in frequency.  The cervix dilates and gets thinner. False labor  Contractions are usually shorter and not as strong as true labor contractions.  Contractions are usually irregular.  Contractions are often felt in the front of the lower abdomen and in the groin.  Contractions may go away when you walk around or change positions while lying down.  Contractions get weaker and are shorter-lasting as time goes on.  The cervix usually does not dilate or become thin. Follow these instructions at home:   Take over-the-counter and prescription medicines only as told by your health care provider.  Keep up with your usual exercises and follow other instructions from your health care provider.  Eat and drink lightly if you think you are going into labor.  If Braxton Hicks contractions are making you uncomfortable: ? Change your position from lying down or resting to walking, or change from walking to resting. ? Sit and rest in a tub of warm water. ? Drink enough fluid to keep your urine pale yellow. Dehydration may cause these contractions. ? Do slow and deep breathing several times an hour.  Keep all follow-up prenatal visits as told by your health care provider. This is important. Contact a  health care provider if:  You have a fever.  You have continuous pain in your abdomen. Get help right away if:  Your contractions become stronger, more regular, and closer together.  You have fluid leaking or gushing from your vagina.  You pass blood-tinged mucus (bloody show).  You have bleeding from your vagina.  You have low back pain that you never had before.  You feel your baby's head pushing down and causing pelvic pressure.  Your baby is not moving inside you as much as it used to. Summary  Contractions that occur before labor are called Braxton Hicks contractions, false labor, or practice contractions.  Braxton Hicks contractions are usually shorter, weaker, farther apart, and less regular than true labor contractions. True labor contractions usually become progressively stronger and regular, and they become more frequent.  Manage discomfort from Braxton Hicks contractions by changing position, resting in a warm bath, drinking plenty of water, or practicing deep breathing. This information is not intended to replace advice given to you by your health care provider. Make sure you discuss any questions you have with your health care provider. Document Revised: 09/04/2017 Document Reviewed: 02/05/2017 Elsevier Patient Education  2020 Elsevier Inc.  

## 2019-10-12 NOTE — Progress Notes (Signed)
   PRENATAL VISIT NOTE  Subjective:  Nicole Love is a 29 y.o. E3P2951 at [redacted]w[redacted]d being seen today for ongoing prenatal care.  She is currently monitored for the following issues for this high-risk pregnancy and has Hyperthyroidism; Previous cesarean delivery, antepartum; Thyroid disease affecting pregnancy; Supervision of other normal pregnancy, antepartum; Asymptomatic bacteriuria during pregnancy; History of VBAC; and S/P tubal ligation on their problem list.  Patient reports no complaints.  Contractions: Irregular. Vag. Bleeding: None.  Movement: Present. Denies leaking of fluid.   The following portions of the patient's history were reviewed and updated as appropriate: allergies, current medications, past family history, past medical history, past social history, past surgical history and problem list.   Objective:   Vitals:   10/12/19 1358  BP: 111/66  Pulse: 84  Weight: 167 lb (75.8 kg)    Fetal Status: Fetal Heart Rate (bpm): NST   Movement: Present     General:  Alert, oriented and cooperative. Patient is in no acute distress.  Skin: Skin is warm and dry. No rash noted.   Cardiovascular: Normal heart rate noted  Respiratory: Normal respiratory effort, no problems with respiration noted  Abdomen: Soft, gravid, appropriate for gestational age.  Pain/Pressure: Present     Pelvic: Cervical exam deferred        Extremities: Normal range of motion.  Edema: None  Mental Status: Normal mood and affect. Normal behavior. Normal judgment and thought content.   Assessment and Plan:  Pregnancy: [id number] at [redacted]w[redacted]d 1. Supervision of high risk pregnancy, antepartum - Doing well - IOL scheduled for tomorrow night. Orders entered.   2. Post term pregnancy, antepartum condition or complication - 8/8 BPP and reactive NST today  Fetal Monitoring: Baseline: 140 bpm Variability: moderate Accelerations: 15 x 15 Decelerations: none Contractions: few, irregular    Term labor symptoms and  general obstetric precautions including but not limited to vaginal bleeding, contractions, leaking of fluid and fetal movement were reviewed in detail with the patient. Please refer to After Visit Summary for other counseling recommendations.   Return in about 4 weeks (around 11/09/2019) for PP visit, In-Person.  Future Appointments  Date Time Provider Department Center  10/13/2019  8:25 AM [facility]-SCREENING [facility] None  10/14/2019 12:00 AM [facility]-LD SCHED ROOM [facility] None    Vonzella Nipple, PA-C

## 2019-10-12 NOTE — Progress Notes (Signed)
Pt informed that the ultrasound is considered a limited OB ultrasound and is not intended to be a complete ultrasound exam.  Patient also informed that the ultrasound is not being completed with the intent of assessing for fetal or placental anomalies or any pelvic abnormalities.  Explained that the purpose of today's ultrasound is to assess for  Presentation, BPP and amniotic fluid volume.  Patient acknowledges the purpose of the exam and the limitations of the study.    IOL scheduled on 10/14/19 @ midnight

## 2019-10-13 ENCOUNTER — Other Ambulatory Visit (HOSPITAL_COMMUNITY)
Admission: RE | Admit: 2019-10-13 | Discharge: 2019-10-13 | Disposition: A | Discharge status: Home / Self Care | Payer: Medicaid Other | Source: Ambulatory Visit | Attending: Family Medicine | Admitting: Family Medicine

## 2019-10-13 LAB — SARS CORONAVIRUS 2 (TAT 6-24 HRS): SARS Coronavirus 2: NEGATIVE

## 2019-10-13 NOTE — Progress Notes (Signed)
I have reviewed this chart and agree with the RN/CMA assessment and management.    K. Meryl Joni Colegrove, M.D. Attending [facility] (Faculty Practice)   

## 2019-10-14 ENCOUNTER — Inpatient Hospital Stay (HOSPITAL_COMMUNITY): Payer: Medicaid Other | Admitting: Anesthesiology

## 2019-10-14 ENCOUNTER — Inpatient Hospital Stay (HOSPITAL_COMMUNITY)
Admission: AD | Admit: 2019-10-14 | Discharge: 2019-10-15 | DRG: 807 | Disposition: A | Discharge status: Home / Self Care | Payer: Medicaid Other | Attending: Obstetrics and Gynecology | Admitting: Obstetrics and Gynecology

## 2019-10-14 ENCOUNTER — Encounter (HOSPITAL_COMMUNITY): Payer: Self-pay | Admitting: Family Medicine

## 2019-10-14 ENCOUNTER — Inpatient Hospital Stay (HOSPITAL_COMMUNITY): Payer: Medicaid Other

## 2019-10-14 ENCOUNTER — Other Ambulatory Visit: Payer: Self-pay

## 2019-10-14 DIAGNOSIS — Z3A40 40 weeks gestation of pregnancy: Secondary | ICD-10-CM | POA: Diagnosis not present

## 2019-10-14 DIAGNOSIS — O358XX0 Maternal care for other (suspected) fetal abnormality and damage, not applicable or unspecified: Secondary | ICD-10-CM | POA: Diagnosis present

## 2019-10-14 DIAGNOSIS — Z20822 Contact with and (suspected) exposure to covid-19: Secondary | ICD-10-CM | POA: Diagnosis present

## 2019-10-14 DIAGNOSIS — O480 Post-term pregnancy: Secondary | ICD-10-CM | POA: Diagnosis not present

## 2019-10-14 DIAGNOSIS — E0590 Thyrotoxicosis, unspecified without thyrotoxic crisis or storm: Secondary | ICD-10-CM | POA: Diagnosis present

## 2019-10-14 DIAGNOSIS — O34219 Maternal care for unspecified type scar from previous cesarean delivery: Secondary | ICD-10-CM | POA: Diagnosis present

## 2019-10-14 DIAGNOSIS — O99284 Endocrine, nutritional and metabolic diseases complicating childbirth: Secondary | ICD-10-CM | POA: Diagnosis present

## 2019-10-14 LAB — CBC
HCT: 32.6 % — ABNORMAL LOW (ref 36.0–46.0)
Hemoglobin: 10.7 g/dL — ABNORMAL LOW (ref 12.0–15.0)
MCH: 29.6 pg (ref 26.0–34.0)
MCHC: 32.8 g/dL (ref 30.0–36.0)
MCV: 90.3 fL (ref 80.0–100.0)
Platelets: 233 10*3/uL (ref 150–400)
RBC: 3.61 MIL/uL — ABNORMAL LOW (ref 3.87–5.11)
RDW: 14.4 % (ref 11.5–15.5)
WBC: 11.9 10*3/uL — ABNORMAL HIGH (ref 4.0–10.5)
nRBC: 0 % (ref 0.0–0.2)

## 2019-10-14 LAB — ABO/RH: ABO/RH(D): A POS

## 2019-10-14 LAB — RPR: RPR Ser Ql: NONREACTIVE

## 2019-10-14 LAB — TYPE AND SCREEN
ABO/RH(D): A POS
Antibody Screen: NEGATIVE

## 2019-10-14 MED ORDER — LIDOCAINE HCL (PF) 1 % IJ SOLN
INTRAMUSCULAR | Status: DC | PRN
  Administered 2019-10-14: 8 mL via EPIDURAL

## 2019-10-14 MED ORDER — WITCH HAZEL-GLYCERIN EX PADS: 1.0000 "application " | MEDICATED_PAD | CUTANEOUS | Status: DC | PRN

## 2019-10-14 MED ORDER — IBUPROFEN 600 MG PO TABS
600.0000 mg | ORAL_TABLET | Freq: Four times a day (QID) | ORAL | Status: DC
  Administered 2019-10-14 – 2019-10-15 (×4): 600 mg via ORAL
  Filled 2019-10-14 (×4): qty 1

## 2019-10-14 MED ORDER — SIMETHICONE 80 MG PO CHEW: 80.0000 mg | CHEWABLE_TABLET | ORAL | Status: DC | PRN

## 2019-10-14 MED ORDER — DIPHENHYDRAMINE HCL 25 MG PO CAPS: 25.0000 mg | ORAL_CAPSULE | Freq: Four times a day (QID) | ORAL | Status: DC | PRN

## 2019-10-14 MED ORDER — EPHEDRINE 5 MG/ML INJ: 10.0000 mg | INTRAVENOUS | Status: DC | PRN

## 2019-10-14 MED ORDER — OXYCODONE-ACETAMINOPHEN 5-325 MG PO TABS: 1.0000 | ORAL_TABLET | ORAL | Status: DC | PRN

## 2019-10-14 MED ORDER — OXYTOCIN 40 UNITS IN NORMAL SALINE INFUSION - SIMPLE MED
1.0000 m[IU]/min | INTRAVENOUS | Status: DC
  Administered 2019-10-14: 2 m[IU]/min via INTRAVENOUS
  Filled 2019-10-14: qty 1000

## 2019-10-14 MED ORDER — COCONUT OIL OIL: 1.0000 "application " | TOPICAL_OIL | Status: DC | PRN

## 2019-10-14 MED ORDER — TERBUTALINE SULFATE 1 MG/ML IJ SOLN: 0.2500 mg | Freq: Once | INTRAMUSCULAR | Status: DC | PRN

## 2019-10-14 MED ORDER — FENTANYL-BUPIVACAINE-NACL 0.5-0.125-0.9 MG/250ML-% EP SOLN: 12.0000 mL/h | EPIDURAL | Status: DC | PRN

## 2019-10-14 MED ORDER — ZOLPIDEM TARTRATE 5 MG PO TABS: 5.0000 mg | ORAL_TABLET | Freq: Every evening | ORAL | Status: DC | PRN

## 2019-10-14 MED ORDER — FENTANYL CITRATE (PF) 100 MCG/2ML IJ SOLN
50.0000 ug | INTRAMUSCULAR | Status: DC | PRN
  Administered 2019-10-14 (×2): 100 ug via INTRAVENOUS
  Filled 2019-10-14 (×2): qty 2

## 2019-10-14 MED ORDER — ONDANSETRON HCL 4 MG/2ML IJ SOLN
4.0000 mg | Freq: Four times a day (QID) | INTRAMUSCULAR | Status: DC | PRN
  Administered 2019-10-14: 4 mg via INTRAVENOUS
  Filled 2019-10-14: qty 2

## 2019-10-14 MED ORDER — LACTATED RINGERS IV SOLN: INTRAVENOUS | Status: DC

## 2019-10-14 MED ORDER — TETANUS-DIPHTH-ACELL PERTUSSIS 5-2.5-18.5 LF-MCG/0.5 IM SUSP: 0.5000 mL | Freq: Once | INTRAMUSCULAR | Status: DC

## 2019-10-14 MED ORDER — ONDANSETRON HCL 4 MG/2ML IJ SOLN: 4.0000 mg | INTRAMUSCULAR | Status: DC | PRN

## 2019-10-14 MED ORDER — OXYTOCIN BOLUS FROM INFUSION
500.0000 mL | Freq: Once | INTRAVENOUS | Status: AC
  Administered 2019-10-14: 500 mL via INTRAVENOUS

## 2019-10-14 MED ORDER — ONDANSETRON HCL 4 MG PO TABS: 4.0000 mg | ORAL_TABLET | ORAL | Status: DC | PRN

## 2019-10-14 MED ORDER — IBUPROFEN 600 MG PO TABS: 600.0000 mg | ORAL_TABLET | Freq: Four times a day (QID) | ORAL | Status: DC

## 2019-10-14 MED ORDER — DIBUCAINE (PERIANAL) 1 % EX OINT: 1.0000 "application " | TOPICAL_OINTMENT | CUTANEOUS | Status: DC | PRN

## 2019-10-14 MED ORDER — LIDOCAINE HCL (PF) 1 % IJ SOLN: 30.0000 mL | INTRAMUSCULAR | Status: DC | PRN

## 2019-10-14 MED ORDER — OXYTOCIN 40 UNITS IN NORMAL SALINE INFUSION - SIMPLE MED: 2.5000 [IU]/h | INTRAVENOUS | Status: DC

## 2019-10-14 MED ORDER — PRENATAL MULTIVITAMIN CH
1.0000 | ORAL_TABLET | Freq: Every day | ORAL | Status: DC
  Administered 2019-10-14 – 2019-10-15 (×2): 1 via ORAL
  Filled 2019-10-14 (×2): qty 1

## 2019-10-14 MED ORDER — ACETAMINOPHEN 325 MG PO TABS: 650.0000 mg | ORAL_TABLET | ORAL | Status: DC | PRN

## 2019-10-14 MED ORDER — BENZOCAINE-MENTHOL 20-0.5 % EX AERO: 1.0000 "application " | INHALATION_SPRAY | CUTANEOUS | Status: DC | PRN

## 2019-10-14 MED ORDER — LACTATED RINGERS IV SOLN: 500.0000 mL | INTRAVENOUS | Status: DC | PRN

## 2019-10-14 MED ORDER — DIPHENHYDRAMINE HCL 50 MG/ML IJ SOLN: 12.5000 mg | INTRAMUSCULAR | Status: DC | PRN

## 2019-10-14 MED ORDER — PHENYLEPHRINE 40 MCG/ML (10ML) SYRINGE FOR IV PUSH (FOR BLOOD PRESSURE SUPPORT)
80.0000 ug | PREFILLED_SYRINGE | INTRAVENOUS | Status: DC | PRN
  Filled 2019-10-14: qty 10

## 2019-10-14 MED ORDER — PRENATAL MULTIVITAMIN CH: 1.0000 | ORAL_TABLET | Freq: Every day | ORAL | Status: DC

## 2019-10-14 MED ORDER — SENNOSIDES-DOCUSATE SODIUM 8.6-50 MG PO TABS
2.0000 | ORAL_TABLET | ORAL | Status: DC
  Administered 2019-10-14: 2 via ORAL
  Filled 2019-10-14: qty 2

## 2019-10-14 MED ORDER — SOD CITRATE-CITRIC ACID 500-334 MG/5ML PO SOLN: 30.0000 mL | ORAL | Status: DC | PRN

## 2019-10-14 MED ORDER — SODIUM CHLORIDE (PF) 0.9 % IJ SOLN
INTRAMUSCULAR | Status: DC | PRN
  Administered 2019-10-14: 12 mL/h via EPIDURAL

## 2019-10-14 MED ORDER — SENNOSIDES-DOCUSATE SODIUM 8.6-50 MG PO TABS: 2.0000 | ORAL_TABLET | ORAL | Status: DC

## 2019-10-14 MED ORDER — LACTATED RINGERS IV SOLN: 500.0000 mL | Freq: Once | INTRAVENOUS | Status: DC

## 2019-10-14 MED ORDER — OXYCODONE-ACETAMINOPHEN 5-325 MG PO TABS: 2.0000 | ORAL_TABLET | ORAL | Status: DC | PRN

## 2019-10-14 MED ORDER — PHENYLEPHRINE 40 MCG/ML (10ML) SYRINGE FOR IV PUSH (FOR BLOOD PRESSURE SUPPORT): 80.0000 ug | PREFILLED_SYRINGE | INTRAVENOUS | Status: DC | PRN

## 2019-10-14 MED ORDER — OXYTOCIN 40 UNITS IN NORMAL SALINE INFUSION - SIMPLE MED: 1.0000 m[IU]/min | INTRAVENOUS | Status: DC

## 2019-10-14 MED ORDER — FENTANYL-BUPIVACAINE-NACL 0.5-0.125-0.9 MG/250ML-% EP SOLN
12.0000 mL/h | EPIDURAL | Status: DC | PRN
  Filled 2019-10-14: qty 250

## 2019-10-14 NOTE — Discharge Summary (Signed)
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 yo [id number] admitted to the hospital 10/14/2019 for induction of labor.  Indication for induction: Postdates.  Patient had an uncomplicated labor course as follows: IOL with FB and pitocin; Arom at 0930 follow by NSVD two hours later after short second stage. Perineum intact.  Membrane Rupture Time/Date: 9:37 AM ,10/14/2019   Intrapartum Procedures: Episiotomy: None [1]                                         Lacerations:  None [1]  Patient had delivery of a Viable infant.  Information for the patient's newborn:  CaMaysun, Meditzirl HaLucerito0[[id number]]Delivery Method: VBAC, Spontaneous(Filed from Delivery Summary)    10/14/2019  Details of delivery can be found in separate delivery note. Desires interval salpingectomy. Patient had a routine postpartum course. Patient is discharged home 10/15/19. Delivery time: 11:37 AM    Magnesium Sulfate received: No BMZ received: No Rhophylac:No MMR:No Transfusion:No  Physical exam  Vitals:   10/14/19 1830 10/14/19 2228 10/15/19 0221 10/15/19 0510  BP: 108/69 117/87 115/72 117/78  Pulse: 73 81 63 82  Resp: _0 Temp: 97.6 F (36.4 C) 98.7 F (37.1 C) 99 F (37.2 C) 98.5 F (36.9 C)  TempSrc: Oral Oral Oral    SpO2:      Weight:      Height:       General: alert, cooperative and no distress Lochia: appropriate Uterine Fundus: firm Incision: N/A DVT Evaluation: No evidence of DVT seen on physical exam. Labs: Lab Results  Component Value Date   WBC 11.9 (H) 10/14/2019   HGB 10.7 (L) 10/14/2019   HCT 32.6 (L) 10/14/2019   MCV 90.3 10/14/2019   PLT 233 10/14/2019   CMP Latest Ref Rng & Units 10/04/2017  Glucose 65 - 99 mg/dL 85  BUN 6 - 20 mg/dL 8  Creatinine 0.44 - 1.00 mg/dL 0.60  Sodium 135 - 145 mmol/L 134(L)  Potassium 3.5 - 5.1 mmol/L 3.5  Chloride 101 - 111 mmol/L 104  CO2 22 - 32 mmol/L 22  Calcium 8.9 - 10.3 mg/dL 9.3  Total Protein 6.5 - 8.1 g/dL 7.4  Total Bilirubin 0.3 - 1.2 mg/dL 0.9  Alkaline Phos 38 - 126 U/L 50  AST 15 - 41 U/L 16  ALT 14 - 54 U/L 11(L)    Discharge instruction: per After Visit Summary and "Baby and Me Booklet".  After visit meds:  Allergies as of 10/15/2019   No Known Allergies     Medication List    STOP taking these medications   prochlorperazine 10 MG tablet Commonly known as: COMPAZINE   scopolamine 1 MG/3DAYS Commonly known as: Transderm-Scop (1.5 MG)     TAKE these medications   acetaminophen 325 MG tablet Commonly known as: Tylenol Take 2 tablets (650 mg total) by mouth every 4 (four) hours as needed (for pain scale < 4).   AMBULATORY NON FORMULARY MEDICATION 1 Device by Other route once a week. Blood Pressure Cuff Medium Monitored Regularly at home ICD 10:Z34.90   ferrous sulfate 325 (65 FE) MG tablet Commonly known as: FerrouSul Take 1 tablet (325 mg total) by mouth 2 (two) times daily.   ibuprofen 600 MG tablet Commonly known as: ADVIL Take 1 tablet (600 mg total) by mouth every 6 (six) hours.   PRENATAL PO Take by mouth.       Diet: routine diet  Activity: Advance as tolerated. Pelvic rest for 6 weeks.   Outpatient follow up:4 weeks Follow up Appt:No future appointments. Follow up Visit:   Please  schedule this patient for Postpartum visit in: 4 weeks with the following provider: Arnetha Courser or Roselie Awkward. They have OR time available the soonest.  For C/S patients schedule nurse incision check in weeks 2 weeks: no Low risk pregnancy complicated by: hyperthyroidism and pregnancy s/p BTL, now desires salpingectomy.  Delivery mode:  SVD Anticipated Birth Control:  desires salpingectomy PP Procedures needed: NA  Schedule Integrated BH visit: no      Newborn Data: Live born female  Birth Weight: 2840g APGAR: 33, 9  Newborn Delivery   Birth date/time: 10/14/2019 11:37:00 Delivery type: VBAC, Spontaneous      Baby Feeding: Breast Disposition:home with mother   10/15/2019 Chauncey Mann, MD

## 2019-10-14 NOTE — Anesthesia Procedure Notes (Signed)
Epidural Patient location during procedure: OB Start time: 10/14/2019 8:18 AM End time: 10/14/2019 8:22 AM  Staffing Anesthesiologist: Bethena Midget, MD  Preanesthetic Checklist Completed: patient identified, IV checked, site marked, risks and benefits discussed, surgical consent, monitors and equipment checked, pre-op evaluation and timeout performed  Epidural Patient position: sitting Prep: DuraPrep and site prepped and draped Patient monitoring: continuous pulse ox and blood pressure Approach: midline Location: L3-L4 Injection technique: LOR air  Needle:  Needle type: Tuohy  Needle gauge: 17 G Needle length: 9 cm and 9 Needle insertion depth: 6 cm Catheter type: closed end flexible Catheter size: 19 Gauge Catheter at skin depth: 11 cm Test dose: negative  Assessment Events: blood not aspirated, injection not painful, no injection resistance, no paresthesia and negative IV test

## 2019-10-14 NOTE — Progress Notes (Signed)
Patient Vitals for the past 4 hrs:  BP Pulse Resp  10/14/19 0700 137/88 (!) 108 18  10/14/19 0630 117/83 71 16  10/14/19 0600 125/79 74 16  10/14/19 0530 113/65 76 16  10/14/19 0500 110/66 66 -  10/14/19 0430 111/62 67 16  10/14/19 0401 125/70 73 -  10/14/19 0331 112/61 72 -   Foley fell out around -215 and Pitocin started.  Cx 4-5/60/-2 per RN. FHR Cat 1.  Ctx now q 2-3 minutes, Pitocin at 6 mu/min.  Will increase Pitocin q 30 minutes until labor.

## 2019-10-14 NOTE — H&P (Signed)
Nicole Love is a 29 y.o. female [phone] with IUP at 61w6dpresenting for IOL for postdates. [facility] Prenatal History/Complications:  CS (failed IOL) first baby 3 VBACS Left hand polydactyly   Past Medical History: Past Medical History:  Diagnosis Date  . Hyperthyroidism   . Pregnancy     Past Surgical History: Past Surgical History:  Procedure Laterality Date  . CESAREAN SECTION    . TUBAL LIGATION N/A 05/17/2018   Procedure: POST PARTUM TUBAL LIGATION;  Surgeon: EChancy Milroy MD;  [facility]  Service: Gynecology;  Laterality: N/A;    Obstetrical History: OB History    Gravida  6   Para  4   Term  4   Preterm      AB  1   Living  4     SAB  1   TAB      Ectopic      Multiple  0   Live Births  4           Social History: Social History   Socioeconomic History  . Marital status: Significant Other    Spouse name: Not on file  . Number of children: Not on file  . Years of education: Not on file  . Highest education level: Not on file  Occupational History  . Not on file  Tobacco Use  . Smoking status: Never Smoker  . Smokeless tobacco: Never Used  Substance and Sexual Activity  . Alcohol use: No  . Drug use: No  . Sexual activity: Yes    Birth control/protection: Surgical  Other Topics Concern  . Not on file  Social History Narrative  . Not on file   Social Determinants of Health   Financial Resource Strain:   . Difficulty of Paying Living Expenses: Not on file  Food Insecurity:   . Worried About RCharity fundraiserin the Last Year: Not on file  . Ran Out of Food in the Last Year: Not on file  Transportation Needs:   . Lack of Transportation (Medical): Not on file  . Lack of Transportation (Non-Medical): Not on file  Physical Activity:   . Days of Exercise per Week: Not on file  . Minutes of Exercise per Session: Not on file  Stress:   . Feeling of Stress : Not on file  Social Connections:   .  Frequency of Communication with Friends and Family: Not on file  . Frequency of Social Gatherings with Friends and Family: Not on file  . Attends Religious Services: Not on file  . Active Member of Clubs or Organizations: Not on file  . Attends CArchivistMeetings: Not on file  . Marital Status: Not on file    Family History: Family History  Problem Relation Age of Onset  . Diabetes Maternal Grandmother     Allergies: No Known Allergies  Medications Prior to Admission  Medication Sig Dispense Refill Last Dose  . AMBULATORY NON FORMULARY MEDICATION 1 Device by Other route once a week. Blood Pressure Cuff Medium Monitored Regularly at home ICD 10:Z34.90 1 kit 0   . ferrous sulfate (FERROUSUL) 325 (65 FE) MG tablet Take 1 tablet (325 mg total) by mouth 2 (two) times daily. 60 tablet 1   . Prenatal Vit-Fe Fumarate-FA (PRENATAL PO) Take by mouth.     . prochlorperazine (COMPAZINE) 10 MG tablet Take 1 tablet (10 mg total) by mouth every 6 (six) hours as needed for  nausea or vomiting. (Patient not taking: Reported on 10/12/2019) 30 tablet 0   . scopolamine (TRANSDERM-SCOP, 1.5 MG,) 1 MG/3DAYS Place 1 patch (1.5 mg total) onto the skin every 3 (three) days. (Patient not taking: Reported on 09/26/2019) 10 patch 12         Review of Systems   Constitutional: Negative for fever and chills Eyes: Negative for visual disturbances Respiratory: Negative for shortness of breath, dyspnea Cardiovascular: Negative for chest pain or palpitations  Gastrointestinal: Negative for abdominal pain, vomiting, diarrhea and constipation.   Genitourinary: Negative for dysuria and urgency Musculoskeletal: Negative for back pain, joint pain, myalgias  Neurological: Negative for dizziness and headaches      Blood pressure 119/72, pulse 86, temperature 98.1 F (36.7 C), temperature source Oral, resp. rate 16, height 5' 6" (1.676 m), weight 75.6 kg, unknown if currently breastfeeding. General  appearance: alert, cooperative and no distress Lungs: normal respiratory effort Heart: regular rate and rhythm Abdomen: soft, non-tender; bowel sounds normal Extremities: Homans sign is negative, no sign of DVT DTR's 2+ Presentation: cephalic Fetal monitoring  Baseline: 140 bpm, Variability: Good {> 6 bpm), Accelerations: Reactive and Decelerations: Absent Uterine activity  None  Dilation: 1.5/60/-2/vtx   Prenatal labs: ABO, Rh: A/Positive/-- (06/17 1150) Antibody: Negative (06/17 1150) Rubella: immune RPR: Non Reactive (10/28 0843)  HBsAg: Negative (06/17 1150)  HIV: Non Reactive (10/28 0843)  GBS: Negative/-- (12/21 1613)    Nursing Staff Provider  Office Location  [facility] Dating  5wk 5 d [place]  Language  English Anatomy US  WNL x polydactyly left  Flu Vaccine   09/26/19 Genetic Screen  NIPS: low risk female  AFP:  WNL   TDaP vaccine   08/03/19 Hgb A1C or  GTT Early  Third trimester 77-107-79  Rhogam  n/a   LAB RESULTS   Feeding Plan breast Blood Type A/Positive/-- (06/17 1150)   Contraception Interval BTL or [facility] at [facility]  Antibody Negative (06/17 1150)  Circumcision  girl Rubella 5.93 (06/17 1150)  Pediatrician  [facility] RPR Non Reactive (06/17 1150)   Support Person [place] HBsAg Negative (06/17 1150)   Prenatal Classes Declined-6/17 HIV Non Reactive (06/17 1150)  BTL Consent 09/26/19- plan for salpingectomy at 6 weeks GBS Negative (07/25 0000)(For PCN allergy, check sensitivities)   VBAC Consent  Pap WNL 2020    Hgb Electro   Normal  BP Cuff  has BP cuff CF  Negative    SMA     Waterbirth  [ ] Class [ ] Consent [ ] CNM visit     Prenatal Transfer Tool  Maternal Diabetes: No Genetic Screening: Normal Maternal Ultrasounds/Referrals: Other: Fetal Ultrasounds or other Referrals:  Left hand polydactyly Maternal Substance Abuse:  No Significant Maternal Medications:  None Significant Maternal Lab Results: Group B Strep negative    Results for orders placed  or performed during the hospital encounter of 10/13/19 (from the past 24 hour(s))  SARS CORONAVIRUS 2 (TAT 6-24 HRS) Nasopharyngeal Nasopharyngeal Swab   Collection Time: 10/13/19  8:28 AM   Specimen: Nasopharyngeal Swab  Result Value Ref Range   SARS Coronavirus 2 NEGATIVE NEGATIVE    Assessment: Nicole Love is a 29 y.o. [id number] with an IUP at 60w6dpresenting for IOL for postdates.  Plan: #Labor: >Foley inserted and inflated w/60cc H20->pitocin when it falls out #Pain:  Per request #FWB Cat 1   FChristin Fudge1/05/2020, 12:31 AM

## 2019-10-14 NOTE — Anesthesia Postprocedure Evaluation (Signed)
Anesthesia Post Note  Patient: Emmaclaire Switala Vanderpol  Procedure(s) Performed: AN AD HOC LABOR EPIDURAL     Patient location during evaluation: Mother Baby Anesthesia Type: Epidural Level of consciousness: awake and alert Pain management: pain level controlled Vital Signs Assessment: post-procedure vital signs reviewed and stable Respiratory status: spontaneous breathing, nonlabored ventilation and respiratory function stable Cardiovascular status: stable Postop Assessment: no headache, no backache and epidural receding Anesthetic complications: no    Last Vitals:  Vitals:   10/14/19 1310 10/14/19 1410  BP: 108/75 112/66  Pulse: 68 66  Resp: 16 18  Temp: 36.5 C 36.7 C  SpO2: 100% 100%    Last Pain:  Vitals:   10/14/19 1410  TempSrc: Oral  PainSc: 0-No pain   Pain Goal:                Epidural/Spinal Function Cutaneous sensation: Normal sensation (10/14/19 1410), Patient able to flex knees: Yes (10/14/19 1410), Patient able to lift hips off bed: Yes (10/14/19 1410), Back pain beyond tenderness at insertion site: No (10/14/19 1410), Progressively worsening motor and/or sensory loss: No (10/14/19 1410), Bowel and/or bladder incontinence post epidural: No (10/14/19 1410)  Amberlee Garvey

## 2019-10-14 NOTE — Lactation Note (Signed)
This note was copied from a baby's chart. Lactation Consultation Note  Patient Name: Nicole Love PNTIR'W Date: 10/14/2019 Reason for consult: Initial assessment   2158 - 2204 - LC and LC intern entered the room and mom was resting with baby in the crib with the lights off. Mom politely invited [place] into the room. LC and LC intern asked how breastfeeding was going so far. Mom reported things were going well. She is an experienced breast feeding patient. Baby is latching well and no discomfort. Baby seems to be nursing well with good latch scores and has had output.  [facility] intern asked mom about previous breastfeeding experience. Mom reported that she breastfed her other four children for one year each without much difficulty. Mom plans to breastfeed and has a pump at home.  LC praised mom and encouraged her to call anytime for assistance during her stay. Mom reported that all questions were answered and she will call out for PRN. Educated on feeding frequency, output expectations and hand expression. [name] states that she knows how to hand express. Provided her with a spoon and recommended offering EBM if baby is ever too sleepy to latch or for additional supplementation.  Feeding plan: -Breastfeed 8-12x in 24 hour -Hand express -Call for PRN  Maternal Data Formula Feeding for Exclusion: No Has patient been taught Hand Expression?: No Does the patient have breastfeeding experience prior to this delivery?: Yes  Feeding Feeding Type: Breast Fed  LATCH Score                   Interventions    Lactation Tools Discussed/Used     Consult Status Consult Status: Follow-up Date: 10/15/19 Follow-up type: In-patient    Walker Shadow 10/14/2019, 10:11 PM

## 2019-10-14 NOTE — Anesthesia Preprocedure Evaluation (Addendum)
Anesthesia Evaluation  Patient identified by MRN, date of birth, ID band Patient awake    Reviewed: Allergy & Precautions, H&P , NPO status , Patient's Chart, lab work & pertinent test results, reviewed documented beta blocker date and time   Airway Mallampati: II  TM Distance: >3 FB Neck ROM: full    Dental no notable dental hx.    Pulmonary neg pulmonary ROS,    Pulmonary exam normal breath sounds clear to auscultation       Cardiovascular negative cardio ROS Normal cardiovascular exam Rhythm:regular Rate:Normal     Neuro/Psych negative neurological ROS  negative psych ROS   GI/Hepatic negative GI ROS, Neg liver ROS,   Endo/Other  negative endocrine ROS  Renal/GU negative Renal ROS  negative genitourinary   Musculoskeletal   Abdominal   Peds  Hematology negative hematology ROS (+)   Anesthesia Other Findings   Reproductive/Obstetrics (+) Pregnancy                             Anesthesia Physical Anesthesia Plan  ASA: II  Anesthesia Plan: Epidural   Post-op Pain Management:    Induction:   PONV Risk Score and Plan:   Airway Management Planned:   Additional Equipment:   Intra-op Plan:   Post-operative Plan:   Informed Consent: I have reviewed the patients History and Physical, chart, labs and discussed the procedure including the risks, benefits and alternatives for the proposed anesthesia with the patient or authorized representative who has indicated his/her understanding and acceptance.     Dental Advisory Given  Plan Discussed with: Anesthesiologist  Anesthesia Plan Comments: (Labs checked- platelets confirmed with RN in room. Fetal heart tracing, per RN, reported to be stable enough for sitting procedure. Discussed epidural, and patient consents to the procedure:  included risk of possible headache,backache, failed block, allergic reaction, and nerve injury. This  patient was asked if she had any questions or concerns before the procedure started.)        Anesthesia Quick Evaluation  

## 2019-10-14 NOTE — Progress Notes (Addendum)
Nicole Love is a 29 y.o. [id number] at [redacted]w[redacted]d by ultrasound admitted for induction of labor due to Post dates. Due date 10/08/2019.  Subjective: Doing well, resting in bed comfortably with epidural.  No complaints  Objective: BP 109/67   Pulse 66   Temp 98 F (36.7 C) (Oral)   Resp 16   Ht 5\' 6"  (1.676 m)   Wt 75.6 kg   SpO2 100%   BMI 26.91 kg/m  I/O last 3 completed shifts: In: -  Out: 300 [Emesis/NG output:300] No intake/output data recorded.  FHT:  FHR: 135 bpm, variability: moderate,  accelerations:  Present,  decelerations:  Absent UC:   regular, every 1-3 minutes SVE:   Dilation: 6.5 Effacement (%): 80 Station: -2 Exam by:: [email or web address] SNM AROM, clear fluid, small amount Labs: Lab Results  Component Value Date   WBC 11.9 (H) 10/14/2019   HGB 10.7 (L) 10/14/2019   HCT 32.6 (L) 10/14/2019   MCV 90.3 10/14/2019   PLT 233 10/14/2019    Assessment / Plan: Induction of labor due to post date,  progressing well on pitocin  Labor: Progressing normally Fetal Wellbeing:  Category I Pain Control:  Epidural I/D:  n/a Anticipated MOD:  NSVD  12/12/2019, SNM 10/14/2019, 9:47 AM   I confirm that I have verified the information documented in the nurse midwife student's note and that I have also personally reperformed the history, physical exam and all medical decision making activities of this service and have verified that all service and findings are accurately documented in this student's note.    12/12/2019, CNM 10/14/2019 1:40 PM

## 2019-10-15 MED ORDER — ACETAMINOPHEN 325 MG PO TABS: 650.0000 mg | ORAL_TABLET | ORAL | 0 refills | Status: DC | PRN

## 2019-10-15 MED ORDER — IBUPROFEN 600 MG PO TABS: 600.0000 mg | ORAL_TABLET | Freq: Four times a day (QID) | ORAL | 0 refills | Status: DC

## 2019-10-15 NOTE — Lactation Note (Signed)
This note was copied from a baby's chart. Lactation Consultation Note  Patient Name: Nicole Love Date: 10/15/2019 Reason for consult: Follow-up assessment  P5 mother whose infant is now 6 hours old.  Mother breast fed her other children for one year.  Baby was receiving her hearing screen when I entered.  Mother had no questions/concerns related to breast feeding.  She is familiar with engorgement but thinks she left her manual pump at the hospital with the last child.  Provided a manual pump.  Mother did not wish to review, stating she knew how to use it.  Mother has our OP phone number for questions after discharge.   Maternal Data    Feeding Feeding Type: Breast Fed  LATCH Score                   Interventions    Lactation Tools Discussed/Used     Consult Status Consult Status: Complete Date: 10/15/19 Follow-up type: Call as needed    Zyrus Hetland R Secilia Apps 10/15/2019, 3:15 PM

## 2019-10-15 NOTE — Progress Notes (Signed)
Post Partum Day 1 Subjective: no complaints, up ad lib, voiding, tolerating PO and + flatus  Objective: Blood pressure 117/78, pulse 82, temperature 98.5 F (36.9 C), resp. rate 16, height 5\' 6"  (1.676 m), weight 75.6 kg, SpO2 100 %, unknown if currently breastfeeding.  Physical Exam:  General: alert, cooperative and no distress Lochia: appropriate Uterine Fundus: firm Incision: n/a DVT Evaluation: No evidence of DVT seen on physical exam.  Recent Labs    10/14/19 0042  HGB 10.7*  HCT 32.6*    Assessment/Plan: Plan for discharge tomorrow and Breastfeeding  Infant needs to be seen by pediatrician for management of polydactyly. Patient desires d/c if infant can be discharged today.    LOS: 1 day   12/12/19 CNM 10/15/2019, 9:26 AM

## 2019-11-01 ENCOUNTER — Other Ambulatory Visit: Payer: Self-pay | Admitting: Obstetrics & Gynecology

## 2019-11-01 NOTE — Progress Notes (Signed)
Orders for surgery 

## 2019-11-17 ENCOUNTER — Ambulatory Visit: Payer: Medicaid Other | Admitting: Obstetrics & Gynecology

## 2019-11-17 NOTE — Progress Notes (Unsigned)
Per appointment notes , to notify Rhea Bleacher if pt does not show for appointment.

## 2019-12-07 ENCOUNTER — Ambulatory Visit (HOSPITAL_BASED_OUTPATIENT_CLINIC_OR_DEPARTMENT_OTHER): Admit: 2019-12-07 | Payer: Medicaid Other | Admitting: Obstetrics & Gynecology

## 2019-12-07 ENCOUNTER — Encounter (HOSPITAL_BASED_OUTPATIENT_CLINIC_OR_DEPARTMENT_OTHER): Payer: Self-pay

## 2019-12-07 SURGERY — LIGATION, FALLOPIAN TUBE, LAPAROSCOPIC
Anesthesia: Choice | Laterality: Bilateral

## 2019-12-29 IMAGING — US US MFM OB FOLLOW-UP
1 series · 13 of 28 positions shown · non-contrast
Comparison: none

[Series 1: us mfm ob follow-up · 13 of 53 slices shown]
[im 2/53]
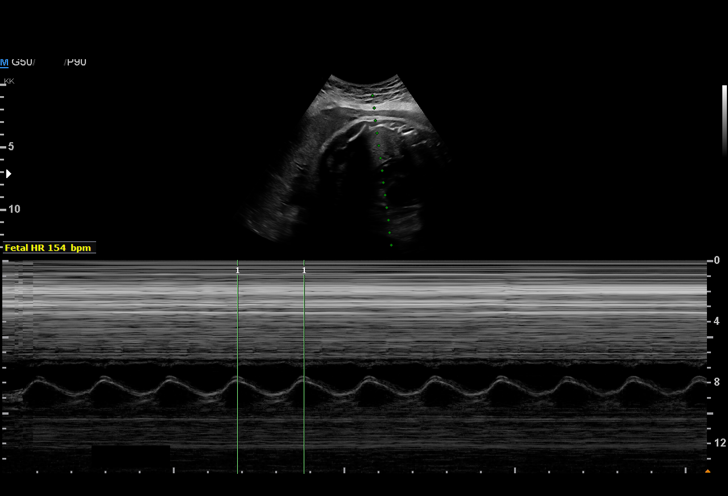
[im 6/53]
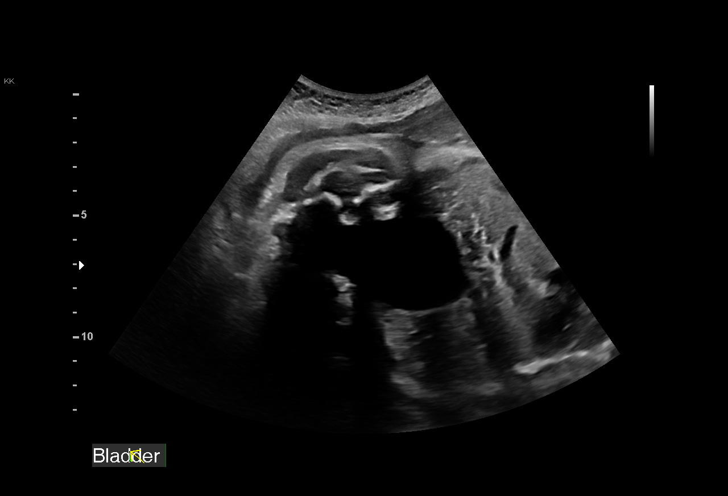
[im 10/53]
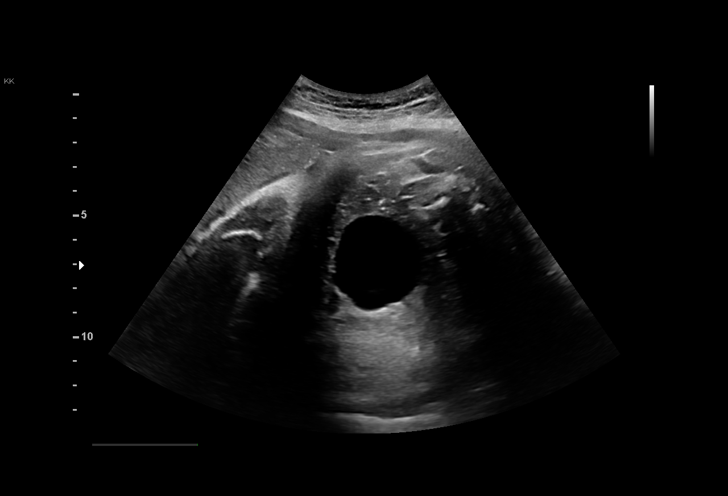
[im 14/53]
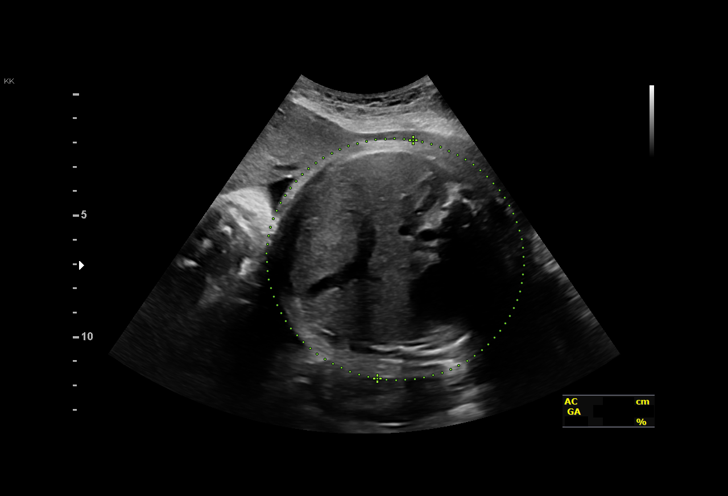
[im 18/53]
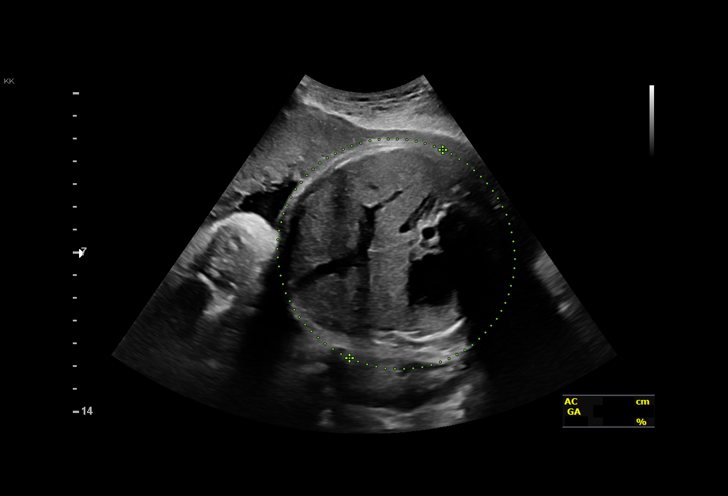
[im 22/53]
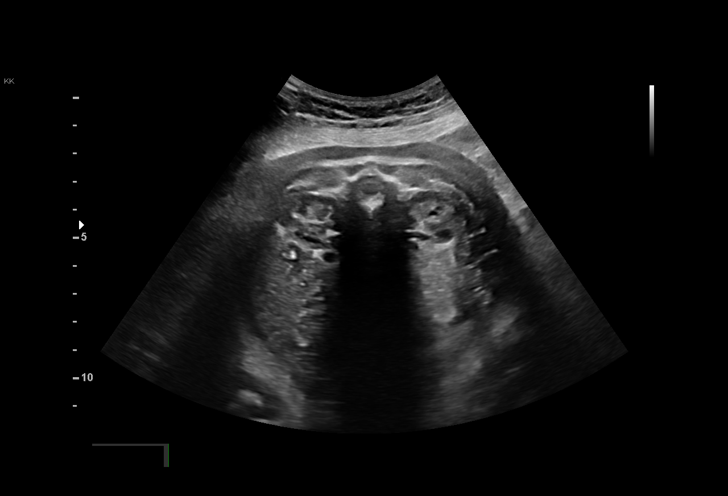
[im 27/53]
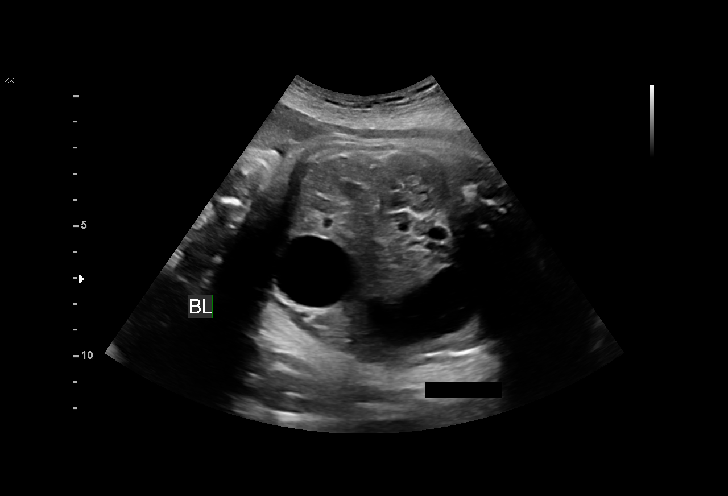
[im 31/53]
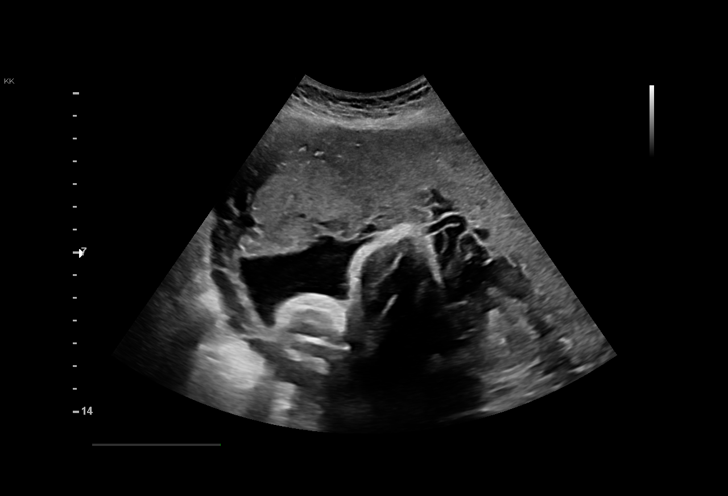
[im 35/53]
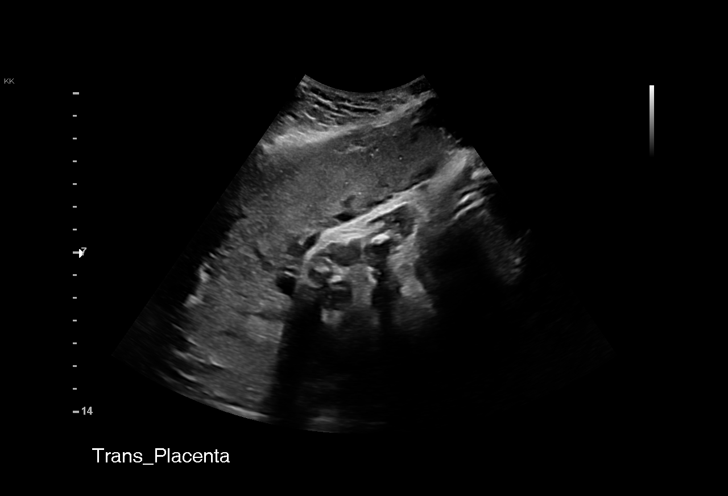
[im 39/53]
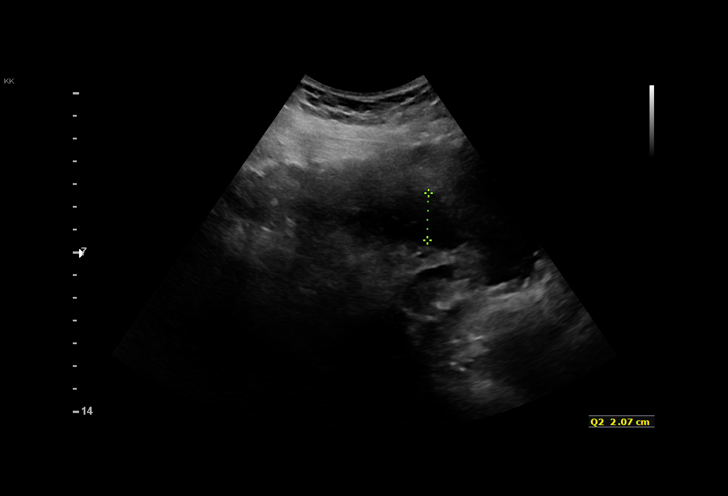
[im 43/53]
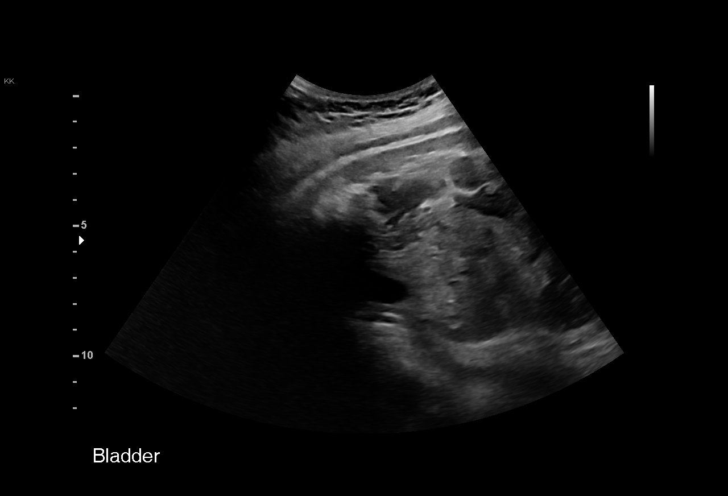
[im 47/53]
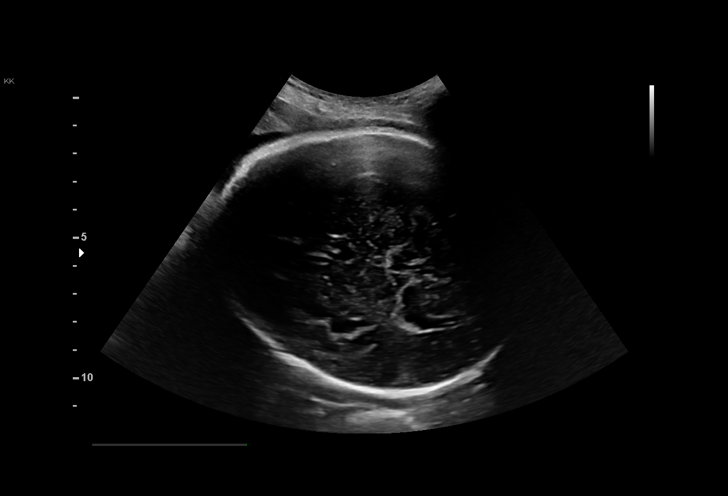
[im 51/53]
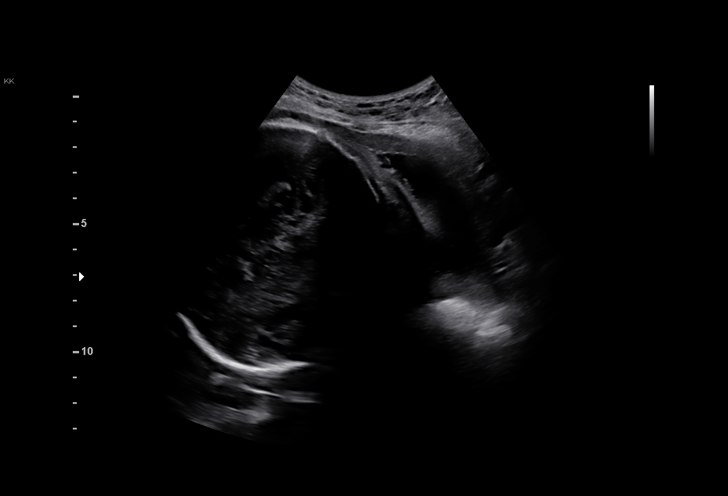

[13 of 28 positions shown; findings below may reference images not displayed]

[address]

 ----------------------------------------------------------------------

 ----------------------------------------------------------------------
Indications

  Hyperthyroid
  Fetal abnormality - other known or
  suspected (Lt polydactyly)
  Previous cesarean delivery, antepartum x1,
  VBAC x3
  Short interval between pregancies, 2nd
  trimester
  37 weeks gestation of pregnancy
  Encounter for other antenatal screening
  follow-up
 ----------------------------------------------------------------------
Vital Signs

                                                Height:        5'6"
Fetal Evaluation

 Num Of Fetuses:         1
 Fetal Heart Rate(bpm):  154
 Cardiac Activity:       Observed
 Presentation:           Cephalic
 Placenta:               Anterior Right Lateral
 P. Cord Insertion:      Previously Visualized

 Amniotic Fluid
 AFI FV:      Within normal limits

 AFI Sum(cm)     %Tile       Largest Pocket(cm)
 8.82            16

 RUQ(cm)                     LUQ(cm)        LLQ(cm)

Biometry

 BPD:      89.8  mm     G. Age:  36w 3d         36  %    CI:        76.66   %    70 - 86
                                                         FL/HC:      21.8   %    20.9 -
 HC:      324.9  mm     G. Age:  36w 6d         13  %    HC/AC:      1.01        0.92 -
 AC:      321.9  mm     G. Age:  36w 1d         25  %    FL/BPD:     79.0   %    71 - 87
 FL:       70.9  mm     G. Age:  36w 2d         21  %    FL/AC:      22.0   %    20 - 24

 Est. FW:    3165  gm      6 lb 6 oz     26  %
OB History

 Gravidity:    6         Term:   4        Prem:   0        SAB:   1
 TOP:          0       Ectopic:  0        Living: 4
Gestational Age

 U/S Today:     36w 3d                                        EDD:   10/16/19
 Best:          37w 4d     Det. By:  Early Exam  (02/10/19)   EDD:   10/08/19
Anatomy

 Cranium:               Previously seen        Aortic Arch:            Previously seen
 Cavum:                 Previously seen        Ductal Arch:            Previously seen
 Ventricles:            Appears normal         Diaphragm:              Previously seen
 Choroid Plexus:        Previously seen        Stomach:                Appears normal, left
                                                                       sided
 Cerebellum:            Previously seen        Abdomen:                Previously seen
 Posterior Fossa:       Previously seen        Abdominal Wall:         Previously seen
 Nuchal Fold:           Previously seen        Cord Vessels:           Appears normal (3
                                                                       vessel cord)
 Face:                  Orbits and profile     Kidneys:                Appear normal
                        previously seen
 Lips:                  Previously seen        Bladder:                Appears normal
 Thoracic:              Previously seen        Spine:                  Previously seen
 Heart:                 Appears normal         Upper Extremities:      Polydactyly(lt
                        (4CH, axis, and
                        situs)
                                                                       hand)

 RVOT:                  Previously seen        Lower Extremities:      Previously seen
 LVOT:                  Previously seen

 Other:  Polydactyly on left hand previously. Rt hand and 5th digit.
Cervix Uterus Adnexa

 Cervix
 Not visualized (advanced GA >62wks)
Comments

 This patient was seen for a follow-up ultrasound as an
 enlarged fetal bladder was noted during her last ultrasound
 exam.  She denies any problems since her last exam.
 She was informed that the fetal growth and amniotic fluid
 level appears appropriate for her gestational age.
 A normal-appearing fetal bladder was noted today.
 Follow-up as indicated.

## 2020-06-16 ENCOUNTER — Encounter: Payer: Self-pay | Admitting: Emergency Medicine

## 2020-06-16 ENCOUNTER — Ambulatory Visit
Admission: EM | Admit: 2020-06-16 | Discharge: 2020-06-16 | Disposition: A | Discharge status: Home / Self Care | Payer: Medicaid Other | Attending: Emergency Medicine | Admitting: Emergency Medicine

## 2020-06-16 ENCOUNTER — Other Ambulatory Visit: Payer: Self-pay

## 2020-06-16 DIAGNOSIS — Z1152 Encounter for screening for COVID-19: Secondary | ICD-10-CM | POA: Diagnosis not present

## 2020-06-16 DIAGNOSIS — J069 Acute upper respiratory infection, unspecified: Secondary | ICD-10-CM

## 2020-06-16 MED ORDER — CETIRIZINE HCL 10 MG PO TABS: 10.0000 mg | ORAL_TABLET | Freq: Every day | ORAL | 0 refills | Status: DC

## 2020-06-16 MED ORDER — FLUTICASONE PROPIONATE 50 MCG/ACT NA SUSP: 1.0000 | Freq: Every day | NASAL | 0 refills | Status: DC

## 2020-06-16 MED ORDER — BENZONATATE 100 MG PO CAPS: 100.0000 mg | ORAL_CAPSULE | Freq: Three times a day (TID) | ORAL | 0 refills | Status: DC

## 2020-06-16 MED ORDER — OLOPATADINE HCL 0.2 % OP SOLN: 1.0000 [drp] | Freq: Every day | OPHTHALMIC | 0 refills | Status: DC

## 2020-06-16 NOTE — ED Triage Notes (Signed)
Pt sts cough and nasal congestion with itchy eyes x 3 days

## 2020-06-16 NOTE — Discharge Instructions (Signed)

## 2020-06-16 NOTE — ED Provider Notes (Signed)
[facility]    CSN: [id number] Arrival date & time: 06/16/20  1505      History   Chief Complaint Chief Complaint  Patient presents with  . Cough    HPI Nicole Love is a 29 y.o. female  Presenting for Covid testing.  Endorsing dry cough, nasal congestion, bilateral itchy eyes x3 days.  Patient does endorse history of allergies, though not taking medications for that.  Denies difficulty breathing, chest pain or palpitations, lower leg swelling, nausea, vomiting, diarrhea.  No fever, known sick contacts.  Past Medical History:  Diagnosis Date  . Hyperthyroidism   . Pregnancy     Patient Active Problem List   Diagnosis Date Noted  . Post-dates pregnancy 10/14/2019  . History of VBAC 08/03/2019  . S/P tubal ligation 08/03/2019  . Asymptomatic bacteriuria during pregnancy 03/28/2019  . Supervision of other normal pregnancy, antepartum 03/23/2019  . Thyroid disease affecting pregnancy   . Previous cesarean delivery, antepartum 12/16/2017  . Hyperthyroidism 10/22/2017    Past Surgical History:  Procedure Laterality Date  . CESAREAN SECTION    . TUBAL LIGATION N/A 05/17/2018   Procedure: POST PARTUM TUBAL LIGATION;  Surgeon: Hermina Staggers, MD;  Location: [facility] SU[facility];  Service: Gynecology;  Laterality: N/A;    OB History    Gravida  6   Para  5   Term  5   Preterm      AB  1   Living  5     SAB  1   TAB      Ectopic      Multiple  0   Live Births  5            Home Medications    Prior to Admission medications   Medication Sig Start Date End Date Taking? Authorizing Provider  acetaminophen (TYLENOL) 325 MG tablet Take 2 tablets (650 mg total) by mouth every 4 (four) hours as needed (for pain scale < 4). 10/15/19   Fair, Hoyle Sauer, MD  AMBULATORY NON FORMULARY MEDICATION 1 Device by Other route once a week. Blood Pressure Cuff Medium Monitored Regularly at home ICD 10:Z34.90 03/23/19   Reva Bores, MD  benzonatate  (TESSALON) 100 MG capsule Take 1 capsule (100 mg total) by mouth every 8 (eight) hours. 06/16/20   Hall-Potvin, [place], PA-C  cetirizine (ZYRTEC ALLERGY) 10 MG tablet Take 1 tablet (10 mg total) by mouth daily. 06/16/20   Hall-Potvin, [place], PA-C  ferrous sulfate (FERROUSUL) 325 (65 FE) MG tablet Take 1 tablet (325 mg total) by mouth 2 (two) times daily. 08/05/19   Conan Bowens, MD  fluticasone [facility]) 50 MCG/ACT nasal spray Place 1 spray into both nostrils daily. 06/16/20   Hall-Potvin, [place], PA-C  ibuprofen (ADVIL) 600 MG tablet Take 1 tablet (600 mg total) by mouth every 6 (six) hours. 10/15/19   Fair, Hoyle Sauer, MD  Olopatadine HCl 0.2 % SOLN Apply 1 drop to eye daily. 06/16/20   Hall-Potvin, [place], PA-C  Prenatal Vit-Fe Fumarate-FA (PRENATAL PO) Take by mouth.    [provider]    Family History Family History  Problem Relation Age of Onset  . Diabetes Maternal Grandmother     Social History Social History   Tobacco Use  . Smoking status: Never Smoker  . Smokeless tobacco: Never Used  Vaping Use  . Vaping Use: Never used  Substance Use Topics  . Alcohol use: No  . Drug use: No  Allergies   Patient has no known allergies.   Review of Systems As per HPI   Physical Exam Triage Vital Signs ED Triage Vitals [06/16/20 1601]  Enc Vitals Group     BP 121/75     Pulse Rate 75     Resp 18     Temp 97.9 F (36.6 C)     Temp Source Oral     SpO2 98 %     Weight      Height      Head Circumference      Peak Flow      Pain Score 3     Pain Loc      Pain Edu?      Excl. in GC?    No data found.  Updated Vital Signs BP 121/75 (BP Location: Right Arm)   Pulse 75   Temp 97.9 F (36.6 C) (Oral)   Resp 18   SpO2 98%   Visual Acuity Right Eye Distance:   Left Eye Distance:   Bilateral Distance:    Right Eye Near:   Left Eye Near:    Bilateral Near:     Physical Exam Constitutional:      General: She is not in acute distress.     Appearance: She is obese. She is not ill-appearing or diaphoretic.  HENT:     Head: Normocephalic and atraumatic.     Mouth/Throat:     Mouth: Mucous membranes are moist.     Pharynx: Oropharynx is clear. No oropharyngeal exudate or posterior oropharyngeal erythema.  Eyes:     General: No scleral icterus.    Conjunctiva/sclera: Conjunctivae normal.     Pupils: Pupils are equal, round, and reactive to light.  Neck:     Comments: Trachea midline, negative JVD Cardiovascular:     Rate and Rhythm: Normal rate and regular rhythm.     Heart sounds: No murmur heard.  No gallop.   Pulmonary:     Effort: Pulmonary effort is normal. No respiratory distress.     Breath sounds: No wheezing, rhonchi or rales.  Musculoskeletal:     Cervical back: Neck supple. No tenderness.  Lymphadenopathy:     Cervical: No cervical adenopathy.  Skin:    Capillary Refill: Capillary refill takes less than 2 seconds.     Coloration: Skin is not jaundiced or pale.     Findings: No rash.  Neurological:     General: No focal deficit present.     Mental Status: She is alert and oriented to person, place, and time.      UC Treatments / Results  Labs (all labs ordered are listed, but only abnormal results are displayed) Labs Reviewed  NOVEL CORONAVIRUS, NAA    EKG   Radiology No results found.  Procedures Procedures (including critical care time)  Medications Ordered in UC Medications - No data to display  Initial Impression / Assessment and Plan / UC Course  I have reviewed the triage vital signs and the nursing notes.  Pertinent labs & imaging results that were available during my care of the patient were reviewed by me and considered in my medical decision making (see chart for details).     Patient afebrile, nontoxic, with SpO2 98%.  Covid PCR pending.  Patient to quarantine until results are back.  We will treat supportively as outlined below.  Return precautions discussed, patient  verbalized understanding and is agreeable to plan. Final Clinical Impressions(s) / UC Diagnoses   Final  diagnoses:  Encounter for screening for COVID-19  URI with cough and congestion     Discharge Instructions     Tessalon for cough. Start flonase, atrovent nasal spray for nasal congestion/drainage. You can use over the counter nasal saline rinse such as neti pot for nasal congestion. Keep hydrated, your urine should be clear to pale yellow in color. Tylenol/motrin for fever and pain. Monitor for any worsening of symptoms, chest pain, shortness of breath, wheezing, swelling of the throat, go to the emergency department for further evaluation needed.     ED Prescriptions    Medication Sig Dispense Auth. Provider   benzonatate (TESSALON) 100 MG capsule Take 1 capsule (100 mg total) by mouth every 8 (eight) hours. 21 capsule Hall-Potvin, [place], PA-C   cetirizine (ZYRTEC ALLERGY) 10 MG tablet Take 1 tablet (10 mg total) by mouth daily. 30 tablet Hall-Potvin, [place], PA-C   fluticasone (FLONASE) 50 MCG/ACT nasal spray Place 1 spray into both nostrils daily. 16 g Hall-Potvin, [place], PA-C   Olopatadine HCl 0.2 % SOLN Apply 1 drop to eye daily. 2.5 mL Hall-Potvin, [place], PA-C     PDMP not reviewed this encounter.   Hall-Potvin, [place], [place] 06/16/20 1652

## 2020-06-18 LAB — NOVEL CORONAVIRUS, NAA: SARS-CoV-2, NAA: NOT DETECTED

## 2020-06-18 LAB — SARS-COV-2, NAA 2 DAY TAT

## 2020-06-29 ENCOUNTER — Ambulatory Visit: Payer: Self-pay

## 2020-07-23 ENCOUNTER — Telehealth: Payer: Medicaid Other | Admitting: Emergency Medicine

## 2020-07-23 DIAGNOSIS — J069 Acute upper respiratory infection, unspecified: Secondary | ICD-10-CM | POA: Diagnosis not present

## 2020-07-23 MED ORDER — FLUTICASONE PROPIONATE 50 MCG/ACT NA SUSP: 1.0000 | Freq: Every day | NASAL | 0 refills | Status: DC

## 2020-07-23 NOTE — Progress Notes (Signed)
We are sorry you are not feeling well.  Here is how we plan to help!  Based on what you have shared with me, it looks like you may have a viral upper respiratory infection.  Upper respiratory infections are caused by a large number of viruses; however, rhinovirus is the most common cause.  It's hard to say if this is 100% viral, or partly allergic due to your environmental exposures.  In either case, I recommend continuing with the Cetirizine (Zyrtec), but I would add the nasal spray listed below as well.   Symptoms vary from person to person, with common symptoms including sore throat, cough, fatigue or lack of energy and feeling of general discomfort.  A low-grade fever of up to 100.4 may present, but is often uncommon.  Symptoms vary however, and are closely related to a person's age or underlying illnesses.  The most common symptoms associated with an upper respiratory infection are nasal discharge or congestion, cough, sneezing, headache and pressure in the ears and face.  These symptoms usually persist for about 3 to 10 days, but can last up to 2 weeks.  It is important to know that upper respiratory infections do not cause serious illness or complications in most cases.    Upper respiratory infections can be transmitted from person to person, with the most common method of transmission being a person's hands.  The virus is able to live on the skin and can infect other persons for up to 2 hours after direct contact.  Also, these can be transmitted when someone coughs or sneezes; thus, it is important to cover the mouth to reduce this risk.  To keep the spread of the illness at bay, good hand hygiene is very important.  This is an infection that is most likely caused by a virus. There are no specific treatments other than to help you with the symptoms until the infection runs its course.  We are sorry you are not feeling well.  Here is how we plan to help!   For nasal congestion, you may use an  oral decongestants such as Mucinex D or if you have glaucoma or high blood pressure use plain Mucinex.  Saline nasal spray or nasal drops can help and can safely be used as often as needed for congestion.  For your congestion, I have prescribed Fluticasone nasal spray one spray in each nostril twice a day  If you do not have a history of heart disease, hypertension, diabetes or thyroid disease, prostate/bladder issues or glaucoma, you may also use Sudafed to treat nasal congestion.  It is highly recommended that you consult with a pharmacist or your primary care physician to ensure this medication is safe for you to take.     If you have a cough, you may use cough suppressants such as Delsym and Robitussin.  If you have glaucoma or high blood pressure, you can also use Coricidin HBP.     If you have a sore or scratchy throat, use a saltwater gargle-  to  teaspoon of salt dissolved in a 4-ounce to 8-ounce glass of warm water.  Gargle the solution for approximately 15-30 seconds and then spit.  It is important not to swallow the solution.  You can also use throat lozenges/cough drops and Chloraseptic spray to help with throat pain or discomfort.  Warm or cold liquids can also be helpful in relieving throat pain.  For headache, pain or general discomfort, you can use Ibuprofen or Tylenol as  directed.   Some authorities believe that zinc sprays or the use of Echinacea may shorten the course of your symptoms.   HOME CARE  Only take medications as instructed by your medical team.  Be sure to drink plenty of fluids. Water is fine as well as fruit juices, sodas and electrolyte beverages. You may want to stay away from caffeine or alcohol. If you are nauseated, try taking small sips of liquids. How do you know if you are getting enough fluid? Your urine should be a pale yellow or almost colorless.  Get rest.  Taking a steamy shower or using a humidifier may help nasal congestion and ease sore throat  pain. You can place a towel over your head and breathe in the steam from hot water coming from a faucet.  Using a saline nasal spray works much the same way.  Cough drops, hard candies and sore throat lozenges may ease your cough.  Avoid close contacts especially the very young and the elderly  Cover your mouth if you cough or sneeze  Always remember to wash your hands.   GET HELP RIGHT AWAY IF:  You develop worsening fever.  If your symptoms do not improve within 10 days  You develop yellow or green discharge from your nose over 3 days.  You have coughing fits  You develop a severe head ache or visual changes.  You develop shortness of breath, difficulty breathing or start having chest pain  Your symptoms persist after you have completed your treatment plan  MAKE SURE YOU   Understand these instructions.  Will watch your condition.  Will get help right away if you are not doing well or get worse.  Your e-visit answers were reviewed by a board certified advanced clinical practitioner to complete your personal care plan. Depending upon the condition, your plan could have included both over the counter or prescription medications. Please review your pharmacy choice. If there is a problem, you may call our nursing hot line at and have the prescription routed to another pharmacy. Your safety is important to [place]. If you have drug allergies check your prescription carefully.   You can use MyChart to ask questions about todays visit, request a non-urgent call back, or ask for a work or school excuse for 24 hours related to this e-Visit. If it has been greater than 24 hours you will need to follow up with your provider, or enter a new e-Visit to address those concerns. You will get an e-mail in the next two days asking about your experience.  I hope that your e-visit has been valuable and will speed your recovery. Thank you for using e-visits.     Approximately 5 minutes was  used in reviewing the patient's chart, questionnaire, prescribing medications, and documentation.

## 2020-11-11 ENCOUNTER — Ambulatory Visit: Payer: Self-pay

## 2020-11-12 ENCOUNTER — Other Ambulatory Visit: Payer: Self-pay

## 2020-11-12 ENCOUNTER — Ambulatory Visit
Admission: EM | Admit: 2020-11-12 | Discharge: 2020-11-12 | Disposition: A | Discharge status: Home / Self Care | Payer: Medicaid Other | Attending: Family Medicine | Admitting: Family Medicine

## 2020-11-12 ENCOUNTER — Ambulatory Visit
Admission: EM | Admit: 2020-11-12 | Discharge: 2020-11-12 | Disposition: A | Discharge status: Home / Self Care | Payer: Medicaid Other

## 2020-11-12 DIAGNOSIS — N3001 Acute cystitis with hematuria: Secondary | ICD-10-CM | POA: Diagnosis not present

## 2020-11-12 DIAGNOSIS — N760 Acute vaginitis: Secondary | ICD-10-CM | POA: Diagnosis not present

## 2020-11-12 LAB — POCT URINALYSIS DIP (MANUAL ENTRY)
Bilirubin, UA: NEGATIVE
Blood, UA: NEGATIVE
Glucose, UA: NEGATIVE mg/dL
Ketones, POC UA: NEGATIVE mg/dL
Nitrite, UA: NEGATIVE
Protein Ur, POC: NEGATIVE mg/dL
Spec Grav, UA: 1.02 (ref 1.010–1.025)
Urobilinogen, UA: 1 E.U./dL
pH, UA: 7.5 (ref 5.0–8.0)

## 2020-11-12 LAB — POCT URINE PREGNANCY: Preg Test, Ur: NEGATIVE

## 2020-11-12 MED ORDER — NITROFURANTOIN MONOHYD MACRO 100 MG PO CAPS: 100.0000 mg | ORAL_CAPSULE | Freq: Two times a day (BID) | ORAL | 0 refills | Status: AC

## 2020-11-12 MED ORDER — FLUCONAZOLE 150 MG PO TABS: 150.0000 mg | ORAL_TABLET | Freq: Once | ORAL | 0 refills | Status: AC

## 2020-11-12 NOTE — ED Triage Notes (Signed)
Pt c/o using new body wash and feels she got possible yeast infection, reports itching/burning/discharge. Pt also reports possible UTI due to strong odor and lower left back pain. Pt states she seems to getting yeast infections and UTI's frequently. Pt denies any unprotected sexual encounters and has no concerns for STI.

## 2020-11-12 NOTE — Discharge Instructions (Signed)
Urine culture and vaginal sample both are pending and should result within 3 days.  If any additional changes to treatment warranted based on results we will notify you via MyChart and send the medications to your pharmacy.  Hydrate well with fluids.

## 2020-11-12 NOTE — ED Provider Notes (Signed)
[facility]    CSN: [id number] Arrival date & time: 11/12/20  1005      History   Chief Complaint Chief Complaint  Patient presents with  . Vaginal Itching  . Back Pain    HPI Nicole Love is a 30 y.o. female.   HPI  Patient presents today for evaluation of vaginal irritation, burning and itching and a thickened discharge.  Patient suspects she may have a yeast infection.  Patient reports she has had previous yeast infections and urinary tract infections occurring more frequently recently.  Denies unprotected sex.  No concern for STI.  Also endorses some left-sided back pain and has noted some changes in odor of her urine.  Past Medical History:  Diagnosis Date  . Hyperthyroidism   . Pregnancy     Patient Active Problem List   Diagnosis Date Noted  . Post-dates pregnancy 10/14/2019  . History of VBAC 08/03/2019  . S/P tubal ligation 08/03/2019  . Asymptomatic bacteriuria during pregnancy 03/28/2019  . Supervision of other normal pregnancy, antepartum 03/23/2019  . Thyroid disease affecting pregnancy   . Previous cesarean delivery, antepartum 12/16/2017  . Hyperthyroidism 10/22/2017    Past Surgical History:  Procedure Laterality Date  . CESAREAN SECTION    . TUBAL LIGATION N/A 05/17/2018   Procedure: POST PARTUM TUBAL LIGATION;  Surgeon: Hermina Staggers, MD;  Location: [facility] BIRTHING SUITES;  Service: Gynecology;  Laterality: N/A;    OB History    Gravida  6   Para  5   Term  5   Preterm      AB  1   Living  5     SAB  1   IAB      Ectopic      Multiple  0   Live Births  5            Home Medications    Prior to Admission medications   Medication Sig Start Date End Date Taking? Authorizing Provider  cetirizine (ZYRTEC ALLERGY) 10 MG tablet Take 1 tablet (10 mg total) by mouth daily. 06/16/20  Yes Hall-Potvin, [place], PA-C  ferrous sulfate (FERROUSUL) 325 (65 FE) MG tablet Take 1 tablet (325 mg total) by mouth 2 (two)  times daily. 08/05/19  Yes Conan Bowens, MD  fluticasone [facility]) 50 MCG/ACT nasal spray Place 1 spray into both nostrils daily. 07/23/20  Yes Roxy Horseman, PA-C  acetaminophen (TYLENOL) 325 MG tablet Take 2 tablets (650 mg total) by mouth every 4 (four) hours as needed (for pain scale < 4). 10/15/19   Fair, Hoyle Sauer, MD  AMBULATORY NON FORMULARY MEDICATION 1 Device by Other route once a week. Blood Pressure Cuff Medium Monitored Regularly at home ICD 10:Z34.90 03/23/19   Reva Bores, MD  benzonatate (TESSALON) 100 MG capsule Take 1 capsule (100 mg total) by mouth every 8 (eight) hours. 06/16/20   Hall-Potvin, [place], PA-C  ibuprofen (ADVIL) 600 MG tablet Take 1 tablet (600 mg total) by mouth every 6 (six) hours. 10/15/19   Fair, Hoyle Sauer, MD  Olopatadine HCl 0.2 % SOLN Apply 1 drop to eye daily. 06/16/20   Hall-Potvin, [place], PA-C  Prenatal Vit-Fe Fumarate-FA (PRENATAL PO) Take by mouth.    [provider]    Family History Family History  Problem Relation Age of Onset  . Diabetes Maternal Grandmother     Social History Social History   Tobacco Use  . Smoking status: Never Smoker  . Smokeless tobacco: Never  Used  Vaping Use  . Vaping Use: Never used  Substance Use Topics  . Alcohol use: No  . Drug use: No     Allergies   Patient has no known allergies. Review of Systems Review of Systems Pertinent negatives listed in HPI  Physical Exam Triage Vital Signs ED Triage Vitals  Enc Vitals Group     BP 11/12/20 1018 117/84     Pulse Rate 11/12/20 1018 72     Resp 11/12/20 1018 18     Temp 11/12/20 1018 98.1 F (36.7 C)     Temp Source 11/12/20 1018 Oral     SpO2 11/12/20 1018 98 %     Weight 11/12/20 1015 159 lb (72.1 kg)     Height 11/12/20 1015 5\' 6"  (1.676 m)     Head Circumference --      Peak Flow --      Pain Score 11/12/20 1015 8     Pain Loc --      Pain Edu? --      Excl. in GC? --    No data found.  Updated Vital Signs BP 117/84 (BP  Location: Left Arm)   Pulse 72   Temp 98.1 F (36.7 C) (Oral)   Resp 18   Ht 5\' 6"  (1.676 m)   Wt 159 lb (72.1 kg)   LMP 10/19/2020 (Approximate)   SpO2 98%   BMI 25.66 kg/m   Visual Acuity Right Eye Distance:   Left Eye Distance:   Bilateral Distance:    Right Eye Near:   Left Eye Near:    Bilateral Near:     Physical Exam General appearance: alert, well developed, well nourished, cooperative Head: Normocephalic, without obvious abnormality, atraumatic Respiratory: Respirations even and unlabored, normal respiratory rate Heart: rate and rhythm normal. No gallop or murmurs noted on exam  Abdomen: BS +, no distention, no rebound tenderness, no CVA tenderness Extremities: No gross deformities Skin: Skin color, texture, turgor normal. No rashes seen  Psych: Appropriate mood and affect. Neurologic: Mental status: Alert, oriented to person, place, and time, thought content appropriate.   UC Treatments / Results  Labs (all labs ordered are listed, but only abnormal results are displayed) Labs Reviewed  POCT URINALYSIS DIP (MANUAL ENTRY) - Abnormal; Notable for the following components:      Result Value   Leukocytes, UA Trace (*)    All other components within normal limits  POCT URINE PREGNANCY    EKG   Radiology No results found.  Procedures Procedures (including critical care time)  Medications Ordered in UC Medications - No data to display  Initial Impression / Assessment and Plan / UC Course  I have reviewed the triage vital signs and the nursing notes.  Pertinent labs & imaging results that were available during my care of the patient were reviewed by me and considered in my medical decision making (see chart for details).    Urine pregnancy negative.  UA with trace of bacteria and cloudy appearance we will treat empirically acute cystitis with Macrobid 100 mg twice daily for 5 days.  For vaginitis symptoms will prescribe Diflucan 150 mg once and repeat  in 3 days. Vaginal cytology pending. Final Clinical Impressions(s) / UC Diagnoses   Final diagnoses:  Vaginitis and vulvovaginitis  Acute cystitis with hematuria     Discharge Instructions     Urine culture and vaginal sample both are pending and should result within 3 days.  If any additional changes to  treatment warranted based on results we will notify you via MyChart and send the medications to your pharmacy.  Hydrate well with fluids.    ED Prescriptions    Medication Sig Dispense Auth. Provider   nitrofurantoin, macrocrystal-monohydrate, (MACROBID) 100 MG capsule Take 1 capsule (100 mg total) by mouth 2 (two) times daily for 5 days. 10 capsule Bing Neighbors, FNP   fluconazole (DIFLUCAN) 150 MG tablet Take 1 tablet (150 mg total) by mouth once for 1 dose. Repeat if needed 2 tablet Bing Neighbors, FNP     PDMP not reviewed this encounter.   Bing Neighbors, FNP 11/12/20 1106

## 2020-11-14 LAB — CERVICOVAGINAL ANCILLARY ONLY
Bacterial Vaginitis (gardnerella): POSITIVE — AB
Candida Glabrata: NEGATIVE
Candida Vaginitis: POSITIVE — AB
Chlamydia: NEGATIVE
Comment: NEGATIVE
Comment: NEGATIVE
Comment: NEGATIVE
Comment: NEGATIVE
Comment: NEGATIVE
Comment: NORMAL
Neisseria Gonorrhea: NEGATIVE
Trichomonas: NEGATIVE

## 2020-11-15 ENCOUNTER — Telehealth (HOSPITAL_COMMUNITY): Payer: Self-pay | Admitting: Emergency Medicine

## 2020-11-15 LAB — URINE CULTURE
Culture: >=100000 — AB
Special Requests: NORMAL

## 2020-11-15 MED ORDER — METRONIDAZOLE 500 MG PO TABS: 500.0000 mg | ORAL_TABLET | Freq: Two times a day (BID) | ORAL | 0 refills | Status: DC

## 2020-11-16 ENCOUNTER — Encounter: Payer: Self-pay | Admitting: Internal Medicine

## 2020-11-16 ENCOUNTER — Ambulatory Visit (INDEPENDENT_AMBULATORY_CARE_PROVIDER_SITE_OTHER): Payer: Medicaid Other | Admitting: Internal Medicine

## 2020-11-16 ENCOUNTER — Other Ambulatory Visit: Payer: Self-pay

## 2020-11-16 DIAGNOSIS — Z7689 Persons encountering health services in other specified circumstances: Secondary | ICD-10-CM | POA: Diagnosis not present

## 2020-11-16 DIAGNOSIS — B373 Candidiasis of vulva and vagina: Secondary | ICD-10-CM

## 2020-11-16 DIAGNOSIS — B3731 Acute candidiasis of vulva and vagina: Secondary | ICD-10-CM

## 2020-11-16 DIAGNOSIS — N390 Urinary tract infection, site not specified: Secondary | ICD-10-CM | POA: Diagnosis not present

## 2020-11-16 MED ORDER — NITROFURANTOIN MONOHYD MACRO 100 MG PO CAPS: ORAL_CAPSULE | ORAL | 2 refills | Status: DC

## 2020-11-16 MED ORDER — FLUCONAZOLE 150 MG PO TABS: 150.0000 mg | ORAL_TABLET | ORAL | 0 refills | Status: DC

## 2020-11-16 NOTE — Progress Notes (Signed)
Virtual Visit via Telephone Note  I connected with Nicole Love, on 11/16/2020 at 9:29 AM by telephone due to the COVID-19 pandemic and verified that I am speaking with the correct person using two identifiers.   Consent: I discussed the limitations, risks, security and privacy concerns of performing an evaluation and management service by telephone and the availability of in person appointments. I also discussed with the patient that there may be a patient responsible charge related to this service. The patient expressed understanding and agreed to proceed.   Location of Patient: Home   Location of Provider: Clinic    Persons participating in Telemedicine visit: Afia Messenger Dr. Juleen China    History of Present Illness: Patient has a visit to establish care. Has not been seen by provider since birth of her child a little over a year ago.   Has a history of hyperthyroidism. However, reports that her levels never became high enough to take medication during pregnancy and levels only seem to become abnormal during pregnancies.   Patient reports recurrent yeast infections and UTIs. Reports as soon as she is treated for these, she will have another two weeks later. Says alternates between whether she gets UTI or yeast infection first. She has seen provider for this but reports she has only done short term courses of medications. Reports this has been occurring since birth of her 4th child in 2019 but has worsened over the past couple months.     Past Medical History:  Diagnosis Date  . Hyperthyroidism   . Pregnancy    No Known Allergies  Current Outpatient Medications on File Prior to Visit  Medication Sig Dispense Refill  . benzonatate (TESSALON) 100 MG capsule Take 1 capsule (100 mg total) by mouth every 8 (eight) hours. 21 capsule 0  . cetirizine (ZYRTEC ALLERGY) 10 MG tablet Take 1 tablet (10 mg total) by mouth daily. 30 tablet 0  . ferrous sulfate  (FERROUSUL) 325 (65 FE) MG tablet Take 1 tablet (325 mg total) by mouth 2 (two) times daily. 60 tablet 1  . fluticasone (FLONASE) 50 MCG/ACT nasal spray Place 1 spray into both nostrils daily. 16 g 0  . ibuprofen (ADVIL) 600 MG tablet Take 1 tablet (600 mg total) by mouth every 6 (six) hours. 30 tablet 0  . metroNIDAZOLE (FLAGYL) 500 MG tablet Take 1 tablet (500 mg total) by mouth 2 (two) times daily. 14 tablet 0  . nitrofurantoin, macrocrystal-monohydrate, (MACROBID) 100 MG capsule Take 1 capsule (100 mg total) by mouth 2 (two) times daily for 5 days. 10 capsule 0  . Olopatadine HCl 0.2 % SOLN Apply 1 drop to eye daily. 2.5 mL 0  . acetaminophen (TYLENOL) 325 MG tablet Take 2 tablets (650 mg total) by mouth every 4 (four) hours as needed (for pain scale < 4). (Patient not taking: Reported on 11/16/2020) 30 tablet 0  . AMBULATORY NON FORMULARY MEDICATION 1 Device by Other route once a week. Blood Pressure Cuff Medium Monitored Regularly at home ICD 10:Z34.90 1 kit 0  . Prenatal Vit-Fe Fumarate-FA (PRENATAL PO) Take by mouth.     No current facility-administered medications on file prior to visit.    Observations/Objective: NAD. Speaking clearly.  Work of breathing normal.  Alert and oriented. Mood appropriate.   Assessment and Plan: 1. Encounter to establish care Reviewed patient's PMH, social history, surgical history, and medications.  Is overdue for annual exam, screening blood work, and health maintenance topics. Have asked patient  to return for visit to address these items.   2. Recurrent candidiasis of vagina This likely could be related to recurrent antibiotic use; however, patient reports sometimes yeast infection precedes UTI. Given that she will be using antibiotic therapy, will treat for presumed recurrence of yeast vaginitis with once weekly Fluconazole q6 months. Advised on appropriate vaginal hygiene and avoidance of fragrance soaps, detergents, etc.  - fluconazole (DIFLUCAN)  150 MG tablet; Take 1 tablet (150 mg total) by mouth once a week.  Dispense: 24 tablet; Refill: 0   3. Recurrent UTI Patient with h/o recurrent UTIs related to coitus. Discussed preventative methods including post-coital voiding, adequate water intake, avoidance of constipation, etc. Prescribed low dose Macrobid to use after sexual intercourse. However, after appointment was able to review that past 3 urine cultures have been indeterminate for sensitivities to Macrobid. If continues to have UTIs, would switch antibiotic prophylaxis for presumed resistance.  - nitrofurantoin, macrocrystal-monohydrate, (MACROBID) 100 MG capsule; Take one tablet after sexual intercourse.  Dispense: 30 capsule; Refill: 2   Follow Up Instructions: Annual exam    I discussed the assessment and treatment plan with the patient. The patient was provided an opportunity to ask questions and all were answered. The patient agreed with the plan and demonstrated an understanding of the instructions.   The patient was advised to call back or seek an in-person evaluation if the symptoms worsen or if the condition fails to improve as anticipated.     I provided 14 minutes total of non-face-to-face time during this encounter including median intraservice time, reviewing previous notes, investigations, ordering medications, medical decision making, coordinating care and patient verbalized understanding at the end of the visit.    Phill Myron, D.O. Primary Care at [facility]  11/16/2020, 9:29 AM

## 2021-02-01 ENCOUNTER — Other Ambulatory Visit: Payer: Self-pay

## 2021-02-01 DIAGNOSIS — B373 Candidiasis of vulva and vagina: Secondary | ICD-10-CM

## 2021-02-01 DIAGNOSIS — B3731 Acute candidiasis of vulva and vagina: Secondary | ICD-10-CM

## 2021-02-08 ENCOUNTER — Other Ambulatory Visit (HOSPITAL_COMMUNITY)
Admission: RE | Admit: 2021-02-08 | Discharge: 2021-02-08 | Disposition: A | Discharge status: Home / Self Care | Payer: Medicaid Other | Source: Ambulatory Visit | Attending: Internal Medicine | Admitting: Internal Medicine

## 2021-02-08 ENCOUNTER — Other Ambulatory Visit: Payer: Self-pay

## 2021-02-08 ENCOUNTER — Encounter: Payer: Self-pay | Admitting: Internal Medicine

## 2021-02-08 ENCOUNTER — Ambulatory Visit (INDEPENDENT_AMBULATORY_CARE_PROVIDER_SITE_OTHER): Payer: Medicaid Other | Admitting: Internal Medicine

## 2021-02-08 VITALS — BP 115/77 | HR 79 | Resp 16 | Wt 143.8 lb

## 2021-02-08 DIAGNOSIS — B373 Candidiasis of vulva and vagina: Secondary | ICD-10-CM | POA: Diagnosis not present

## 2021-02-08 DIAGNOSIS — N898 Other specified noninflammatory disorders of vagina: Secondary | ICD-10-CM | POA: Diagnosis not present

## 2021-02-08 MED ORDER — METRONIDAZOLE 0.75 % VA GEL: 1.0000 | Freq: Every day | VAGINAL | 0 refills | Status: DC

## 2021-02-08 NOTE — Progress Notes (Signed)
Pt states every since she gave birth to her last child she has been getting yeast infections a lot  Pt states she is not sexually active  Pt states she has tried otc and prescribed medications  Pt states she can't take Flagyl

## 2021-02-08 NOTE — Progress Notes (Signed)
  Subjective:    Nicole Love - 30 y.o. female MRN [id number]  Date of birth: 22-Apr-1991  HPI  Nicole Love is here for concerns about BV. Reports she thinks she still has an infection. Has been unable to tolerate PO flagyl due to the taste. She has tried breaking it and half and is still unable to swallow. Has never tried vaginal gel.     Health Maintenance:  Health Maintenance Due  Topic Date Due  . COVID-19 Vaccine (1) Never done  . Hepatitis C Screening  Never done    -  reports that she has never smoked. She has never used smokeless tobacco. - Review of Systems: Per HPI. - Past Medical History: Patient Active Problem List   Diagnosis Date Noted  . Post-dates pregnancy 10/14/2019  . History of VBAC 08/03/2019  . S/P tubal ligation 08/03/2019  . Asymptomatic bacteriuria during pregnancy 03/28/2019  . Supervision of other normal pregnancy, antepartum 03/23/2019  . Thyroid disease affecting pregnancy   . Previous cesarean delivery, antepartum 12/16/2017  . Hyperthyroidism 10/22/2017   - Medications: reviewed and updated   Objective:   Physical Exam BP 115/77   Pulse 79   Resp 16   Wt 143 lb 12.8 oz (65.2 kg)   SpO2 98%   BMI 23.21 kg/m  Physical Exam Constitutional:      General: She is not in acute distress.    Appearance: She is not diaphoretic.  Cardiovascular:     Rate and Rhythm: Normal rate.  Pulmonary:     Effort: Pulmonary effort is normal. No respiratory distress.  Musculoskeletal:        General: Normal range of motion.  Skin:    General: Skin is warm and dry.  Neurological:     Mental Status: She is alert and oriented to person, place, and time.  Psychiatric:        Mood and Affect: Affect normal.        Judgment: Judgment normal.            Assessment & Plan:   1. Vaginal discharge Will treat empirically for BV with MetroGel given intolerance to Flagyl. Culture pending.  - Cervicovaginal ancillary only - metroNIDAZOLE (METROGEL  VAGINAL) 0.75 % vaginal gel; Place 1 Applicatorful vaginally at bedtime.  Dispense: 70 g; Refill: 0   Marcy Siren, D.O. 02/08/2021, 9:49 AM Primary Care at [facility]

## 2021-02-11 LAB — CERVICOVAGINAL ANCILLARY ONLY
Bacterial Vaginitis (gardnerella): POSITIVE — AB
Candida Glabrata: NEGATIVE
Candida Vaginitis: NEGATIVE
Chlamydia: NEGATIVE
Comment: NEGATIVE
Comment: NEGATIVE
Comment: NEGATIVE
Comment: NEGATIVE
Comment: NEGATIVE
Comment: NORMAL
Neisseria Gonorrhea: NEGATIVE
Trichomonas: NEGATIVE

## 2021-03-06 ENCOUNTER — Ambulatory Visit
Admission: RE | Admit: 2021-03-06 | Discharge: 2021-03-06 | Disposition: A | Discharge status: Home / Self Care | Payer: Medicaid Other | Source: Ambulatory Visit | Attending: Emergency Medicine | Admitting: Emergency Medicine

## 2021-03-06 ENCOUNTER — Other Ambulatory Visit: Payer: Self-pay

## 2021-03-06 VITALS — BP 104/69 | HR 76 | Temp 98.0°F | Resp 18

## 2021-03-06 DIAGNOSIS — N760 Acute vaginitis: Secondary | ICD-10-CM

## 2021-03-06 DIAGNOSIS — B9689 Other specified bacterial agents as the cause of diseases classified elsewhere: Secondary | ICD-10-CM | POA: Insufficient documentation

## 2021-03-06 MED ORDER — FLUCONAZOLE 150 MG PO TABS: 150.0000 mg | ORAL_TABLET | Freq: Once | ORAL | 0 refills | Status: AC

## 2021-03-06 MED ORDER — CLINDAMYCIN HCL 300 MG PO CAPS: 300.0000 mg | ORAL_CAPSULE | Freq: Two times a day (BID) | ORAL | 0 refills | Status: AC

## 2021-03-06 NOTE — Discharge Instructions (Signed)
Clindamycin 300 mg twice daily for 1 week Swab results pending Follow up if not improving or worsening

## 2021-03-06 NOTE — ED Provider Notes (Signed)
[facility]    CSN: [id number] Arrival date & time: 03/06/21  0818      History   Chief Complaint Chief Complaint  Patient presents with  . Appointment    0900  . Vaginal Discharge    HPI Nicole Love is a 30 y.o. female presenting today for evaluation of vaginal discharge.  Reports over the past couple days she has had returning symptoms of her recurrent BV.  Recently seen by PCP earlier in the month and treated with MetroGel.  Reports that she does not tolerate oral metronidazole/Flagyl and typically cannot complete course of this due to adverse effects.  She declines being sexually active recently and denies any concern of pregnancy or STDs.  Reports symptoms only improved with the vaginal gel for approximately 3 days and then symptoms returned. HPI  Past Medical History:  Diagnosis Date  . Hyperthyroidism   . Pregnancy     Patient Active Problem List   Diagnosis Date Noted  . Post-dates pregnancy 10/14/2019  . History of VBAC 08/03/2019  . S/P tubal ligation 08/03/2019  . Asymptomatic bacteriuria during pregnancy 03/28/2019  . Supervision of other normal pregnancy, antepartum 03/23/2019  . Thyroid disease affecting pregnancy   . Previous cesarean delivery, antepartum 12/16/2017  . Hyperthyroidism 10/22/2017    Past Surgical History:  Procedure Laterality Date  . CESAREAN SECTION    . TUBAL LIGATION N/A 05/17/2018   Procedure: POST PARTUM TUBAL LIGATION;  Surgeon: Hermina Staggers, MD;  Location: [facility];  Service: Gynecology;  Laterality: N/A;    OB History    Gravida  6   Para  5   Term  5   Preterm  0   AB  1   Living  5     SAB  1   IAB  0   Ectopic  0   Multiple      Live Births  5            Home Medications    Prior to Admission medications   Medication Sig Start Date End Date Taking? Authorizing Provider  clindamycin (CLEOCIN) 300 MG capsule Take 1 capsule (300 mg total) by mouth in the morning and at  bedtime for 7 days. 03/06/21 03/13/21 Yes Sharon Stapel C, PA-C  fluconazole (DIFLUCAN) 150 MG tablet Take 1 tablet (150 mg total) by mouth once for 1 dose. 03/06/21 03/06/21 Yes Evolett Somarriba C, PA-C  AMBULATORY NON FORMULARY MEDICATION 1 Device by Other route once a week. Blood Pressure Cuff Medium Monitored Regularly at home ICD 10:Z34.90 03/23/19   Reva Bores, MD  fluticasone [facility]) 50 MCG/ACT nasal spray Place 1 spray into both nostrils daily. 07/23/20   Roxy Horseman, PA-C  Prenatal Vit-Fe Fumarate-FA (PRENATAL PO) Take by mouth.    [provider]  cetirizine (ZYRTEC ALLERGY) 10 MG tablet Take 1 tablet (10 mg total) by mouth daily. Patient not taking: Reported on 02/08/2021 06/16/20 03/06/21  Hall-Potvin, [place], PA-C  ferrous sulfate (FERROUSUL) 325 (65 FE) MG tablet Take 1 tablet (325 mg total) by mouth 2 (two) times daily. Patient not taking: Reported on 02/08/2021 08/05/19 03/06/21  Conan Bowens, MD    Family History Family History  Problem Relation Age of Onset  . Diabetes Maternal Grandmother     Social History Social History   Tobacco Use  . Smoking status: Never Smoker  . Smokeless tobacco: Never Used  Vaping Use  . Vaping Use: Never used  Substance Use  Topics  . Alcohol use: No  . Drug use: No     Allergies   Patient has no known allergies.   Review of Systems Review of Systems  Constitutional: Negative for fever.  Respiratory: Negative for shortness of breath.   Cardiovascular: Negative for chest pain.  Gastrointestinal: Negative for abdominal pain, diarrhea, nausea and vomiting.  Genitourinary: Positive for vaginal discharge. Negative for dysuria, flank pain, genital sores, hematuria, menstrual problem, vaginal bleeding and vaginal pain.  Musculoskeletal: Negative for back pain.  Skin: Negative for rash.  Neurological: Negative for dizziness, light-headedness and headaches.     Physical Exam Triage Vital Signs ED Triage Vitals [03/06/21  0834]  Enc Vitals Group     BP 104/69     Pulse Rate 76     Resp 18     Temp 98 F (36.7 C)     Temp Source Oral     SpO2 99 %     Weight      Height      Head Circumference      Peak Flow      Pain Score 2     Pain Loc      Pain Edu?      Excl. in GC?    No data found.  Updated Vital Signs BP 104/69 (BP Location: Left Arm)   Pulse 76   Temp 98 F (36.7 C) (Oral)   Resp 18   SpO2 99%   Visual Acuity Right Eye Distance:   Left Eye Distance:   Bilateral Distance:    Right Eye Near:   Left Eye Near:    Bilateral Near:     Physical Exam Vitals and nursing note reviewed.  Constitutional:      Appearance: She is well-developed.     Comments: No acute distress  HENT:     Head: Normocephalic and atraumatic.     Nose: Nose normal.  Eyes:     Conjunctiva/sclera: Conjunctivae normal.  Cardiovascular:     Rate and Rhythm: Normal rate.  Pulmonary:     Effort: Pulmonary effort is normal. No respiratory distress.  Abdominal:     General: There is no distension.  Musculoskeletal:        General: Normal range of motion.     Cervical back: Neck supple.  Skin:    General: Skin is warm and dry.  Neurological:     Mental Status: She is alert and oriented to person, place, and time.      UC Treatments / Results  Labs (all labs ordered are listed, but only abnormal results are displayed) Labs Reviewed  CERVICOVAGINAL ANCILLARY ONLY    EKG   Radiology No results found.  Procedures Procedures (including critical care time)  Medications Ordered in UC Medications - No data to display  Initial Impression / Assessment and Plan / UC Course  I have reviewed the triage vital signs and the nursing notes.  Pertinent labs & imaging results that were available during my care of the patient were reviewed by me and considered in my medical decision making (see chart for details).     Vaginal discharge-empirically treating for BV, will try clindamycin as  alternative, swab pending.  Discussed strict return precautions. Patient verbalized understanding and is agreeable with plan.  Final Clinical Impressions(s) / UC Diagnoses   Final diagnoses:  Bacterial vaginosis     Discharge Instructions     Clindamycin 300 mg twice daily for 1 week Swab results pending Follow  up if not improving or worsening    ED Prescriptions    Medication Sig Dispense Auth. Provider   clindamycin (CLEOCIN) 300 MG capsule Take 1 capsule (300 mg total) by mouth in the morning and at bedtime for 7 days. 14 capsule Marshayla Mitschke C, PA-C   fluconazole (DIFLUCAN) 150 MG tablet Take 1 tablet (150 mg total) by mouth once for 1 dose. 2 tablet Bodee Lafoe, [place] C, PA-C     PDMP not reviewed this encounter.   Lew Dawes, [place] 03/06/21 [phone]

## 2021-03-06 NOTE — ED Triage Notes (Signed)
Pt sts recently treated for BV and feels like the sx have returned

## 2021-03-07 LAB — CERVICOVAGINAL ANCILLARY ONLY
Bacterial Vaginitis (gardnerella): POSITIVE — AB
Candida Glabrata: NEGATIVE
Candida Vaginitis: NEGATIVE
Chlamydia: NEGATIVE
Comment: NEGATIVE
Comment: NEGATIVE
Comment: NEGATIVE
Comment: NEGATIVE
Comment: NEGATIVE
Comment: NORMAL
Neisseria Gonorrhea: NEGATIVE
Trichomonas: NEGATIVE

## 2021-03-24 ENCOUNTER — Ambulatory Visit: Payer: Self-pay

## 2021-03-25 ENCOUNTER — Ambulatory Visit: Payer: Self-pay

## 2021-03-28 ENCOUNTER — Ambulatory Visit: Payer: Self-pay

## 2021-03-29 ENCOUNTER — Other Ambulatory Visit: Payer: Self-pay

## 2021-03-29 ENCOUNTER — Ambulatory Visit
Admission: RE | Admit: 2021-03-29 | Discharge: 2021-03-29 | Disposition: A | Discharge status: Home / Self Care | Payer: Medicaid Other | Source: Ambulatory Visit | Attending: Emergency Medicine | Admitting: Emergency Medicine

## 2021-03-29 VITALS — BP 108/73 | HR 78 | Temp 98.3°F | Resp 18

## 2021-03-29 DIAGNOSIS — N760 Acute vaginitis: Secondary | ICD-10-CM | POA: Diagnosis not present

## 2021-03-29 LAB — POCT URINALYSIS DIP (MANUAL ENTRY)
Bilirubin, UA: NEGATIVE
Blood, UA: NEGATIVE
Glucose, UA: NEGATIVE mg/dL
Nitrite, UA: NEGATIVE
Protein Ur, POC: NEGATIVE mg/dL
Spec Grav, UA: >=1.03 — AB (ref 1.010–1.025)
Urobilinogen, UA: 0.2 E.U./dL
pH, UA: 6 (ref 5.0–8.0)

## 2021-03-29 LAB — POCT URINE PREGNANCY: Preg Test, Ur: NEGATIVE

## 2021-03-29 MED ORDER — TINIDAZOLE 500 MG PO TABS: 1.0000 g | ORAL_TABLET | Freq: Every day | ORAL | 0 refills | Status: AC

## 2021-03-29 NOTE — Discharge Instructions (Addendum)
Repeat vaginal swab pending Begin tinidazole 1 g daily x5 days for bacterial vaginosis Please follow-up with OB/GYN for further treatment of recurrent symptoms May try boric acid as well, try after having intercourse to help prevent recurrent symptoms Please read attached also for further triggers

## 2021-03-29 NOTE — ED Triage Notes (Signed)
Four days of vaginal discharge that has increased in the last 2 days. Onset yesterday of dark cloudy urine with an odor. Denies vaginal odor and itching.  Denies hematuria and dysuria. Denies urinary frequency and urgency.  Pt reports that she does not wear cotton underwear. Noting that she wears spandex material underwear, sweats often and panties are typically damp from sweating.   H/o recurrent BV and yeast infection. Pt feels that tx does not seem to resolve sxs.

## 2021-03-29 NOTE — ED Provider Notes (Signed)
[facility]    CSN: [id number] Arrival date & time: 03/29/21  1701      History   Chief Complaint Chief Complaint  Patient presents with   Vaginal Discharge   appointment 5pm    HPI Nicole Love is a 30 y.o. female presenting today for evaluation of vaginal discharge.  Reports vaginal discomfort and odor over the past few days.  Has history of recurrent BV, most recently here earlier this month and treated with clindamycin.  Does not tolerate oral tablets.  Has previously used MetroGel with only modest improvement in symptoms.  Reports symptoms often flare after having intercourse.  Denies any new partners.  HPI  Past Medical History:  Diagnosis Date   Hyperthyroidism    Pregnancy     Patient Active Problem List   Diagnosis Date Noted   Post-dates pregnancy 10/14/2019   History of VBAC 08/03/2019   S/P tubal ligation 08/03/2019   Asymptomatic bacteriuria during pregnancy 03/28/2019   Supervision of other normal pregnancy, antepartum 03/23/2019   Thyroid disease affecting pregnancy    Previous cesarean delivery, antepartum 12/16/2017   Hyperthyroidism 10/22/2017    Past Surgical History:  Procedure Laterality Date   CESAREAN SECTION     TUBAL LIGATION N/A 05/17/2018   Procedure: POST PARTUM TUBAL LIGATION;  Surgeon: Hermina Staggers, MD;  Location: [facility];  Service: Gynecology;  Laterality: N/A;    OB History     Gravida  6   Para  5   Term  5   Preterm  0   AB  1   Living  5      SAB  1   IAB  0   Ectopic  0   Multiple      Live Births  5            Home Medications    Prior to Admission medications   Medication Sig Start Date End Date Taking? Authorizing Provider  tinidazole (TINDAMAX) 500 MG tablet Take 2 tablets (1,000 mg total) by mouth daily with breakfast for 5 days. 03/29/21 04/03/21 Yes Rosealyn Little C, PA-C  AMBULATORY NON FORMULARY MEDICATION 1 Device by Other route once a week. Blood Pressure  Cuff Medium Monitored Regularly at home ICD 10:Z34.90 03/23/19   Reva Bores, MD  fluticasone [facility]) 50 MCG/ACT nasal spray Place 1 spray into both nostrils daily. 07/23/20   Roxy Horseman, PA-C  Prenatal Vit-Fe Fumarate-FA (PRENATAL PO) Take by mouth.    [provider]  cetirizine (ZYRTEC ALLERGY) 10 MG tablet Take 1 tablet (10 mg total) by mouth daily. Patient not taking: Reported on 02/08/2021 06/16/20 03/06/21  Hall-Potvin, [place], PA-C  ferrous sulfate (FERROUSUL) 325 (65 FE) MG tablet Take 1 tablet (325 mg total) by mouth 2 (two) times daily. Patient not taking: Reported on 02/08/2021 08/05/19 03/06/21  Conan Bowens, MD    Family History Family History  Problem Relation Age of Onset   Diabetes Maternal Grandmother     Social History Social History   Tobacco Use   Smoking status: Never   Smokeless tobacco: Never  Vaping Use   Vaping Use: Never used  Substance Use Topics   Alcohol use: No   Drug use: No     Allergies   Patient has no known allergies.   Review of Systems Review of Systems  Constitutional:  Negative for fever.  Respiratory:  Negative for shortness of breath.   Cardiovascular:  Negative for chest pain.  Gastrointestinal:  Negative for abdominal pain, diarrhea, nausea and vomiting.  Genitourinary:  Negative for dysuria, flank pain, genital sores, hematuria, menstrual problem, vaginal bleeding, vaginal discharge and vaginal pain.  Musculoskeletal:  Negative for back pain.  Skin:  Negative for rash.  Neurological:  Negative for dizziness, light-headedness and headaches.    Physical Exam Triage Vital Signs ED Triage Vitals  Enc Vitals Group     BP 03/29/21 1726 108/73     Pulse Rate 03/29/21 1726 78     Resp 03/29/21 1726 18     Temp 03/29/21 1726 98.3 F (36.8 C)     Temp Source 03/29/21 1726 Oral     SpO2 03/29/21 1726 97 %     Weight --      Height --      Head Circumference --      Peak Flow --      Pain Score 03/29/21 1734  0     Pain Loc --      Pain Edu? --      Excl. in GC? --    No data found.  Updated Vital Signs BP 108/73 (BP Location: Right Arm)   Pulse 78   Temp 98.3 F (36.8 C) (Oral)   Resp 18   LMP 03/13/2021 (Exact Date)   SpO2 97%   Visual Acuity Right Eye Distance:   Left Eye Distance:   Bilateral Distance:    Right Eye Near:   Left Eye Near:    Bilateral Near:     Physical Exam Vitals and nursing note reviewed.  Constitutional:      Appearance: She is well-developed.     Comments: No acute distress  HENT:     Head: Normocephalic and atraumatic.     Nose: Nose normal.  Eyes:     Conjunctiva/sclera: Conjunctivae normal.  Cardiovascular:     Rate and Rhythm: Normal rate.  Pulmonary:     Effort: Pulmonary effort is normal. No respiratory distress.  Abdominal:     General: There is no distension.  Musculoskeletal:        General: Normal range of motion.     Cervical back: Neck supple.  Skin:    General: Skin is warm and dry.  Neurological:     Mental Status: She is alert and oriented to person, place, and time.     UC Treatments / Results  Labs (all labs ordered are listed, but only abnormal results are displayed) Labs Reviewed  POCT URINALYSIS DIP (MANUAL ENTRY) - Abnormal; Notable for the following components:      Result Value   Ketones, POC UA trace (5) (*)    Spec Grav, UA >=1.030 (*)    Leukocytes, UA Trace (*)    All other components within normal limits  URINE CULTURE  POCT URINE PREGNANCY  CERVICOVAGINAL ANCILLARY ONLY    EKG   Radiology No results found.  Procedures Procedures (including critical care time)  Medications Ordered in UC Medications - No data to display  Initial Impression / Assessment and Plan / UC Course  I have reviewed the triage vital signs and the nursing notes.  Pertinent labs & imaging results that were available during my care of the patient were reviewed by me and considered in my medical decision making (see  chart for details).     Pregnancy test negative, UA not highly suggestive of UTI, will send for culture to definitively rule out, also swab pending.  Discussed with patient alternative options for  treating BV, opted to trial tinidazole as well as discussed boric acid supplements as she has already purchased these at home.  Encouraged to follow-up with OB/GYN given recurrent BV.  Discussed strict return precautions. Patient verbalized understanding and is agreeable with plan.  Final Clinical Impressions(s) / UC Diagnoses   Final diagnoses:  Vaginitis and vulvovaginitis     Discharge Instructions      Repeat vaginal swab pending Begin tinidazole 1 g daily x5 days for bacterial vaginosis Please follow-up with OB/GYN for further treatment of recurrent symptoms May try boric acid as well, try after having intercourse to help prevent recurrent symptoms Please read attached also for further triggers     ED Prescriptions     Medication Sig Dispense Auth. Provider   tinidazole (TINDAMAX) 500 MG tablet Take 2 tablets (1,000 mg total) by mouth daily with breakfast for 5 days. 10 tablet Raylinn Kosar, [employer] C, PA-C      PDMP not reviewed this encounter.   Lew Dawes, [place] 03/29/21 1829

## 2021-04-01 LAB — CERVICOVAGINAL ANCILLARY ONLY
Bacterial Vaginitis (gardnerella): NEGATIVE
Candida Glabrata: NEGATIVE
Candida Vaginitis: NEGATIVE
Chlamydia: NEGATIVE
Comment: NEGATIVE
Comment: NEGATIVE
Comment: NEGATIVE
Comment: NEGATIVE
Comment: NEGATIVE
Comment: NORMAL
Neisseria Gonorrhea: NEGATIVE
Trichomonas: NEGATIVE

## 2021-04-01 LAB — URINE CULTURE: Culture: <10000 — AB

## 2021-04-04 ENCOUNTER — Telehealth: Payer: Self-pay | Admitting: *Deleted

## 2021-06-01 ENCOUNTER — Ambulatory Visit: Payer: Self-pay

## 2021-06-03 ENCOUNTER — Ambulatory Visit: Payer: Self-pay

## 2021-06-08 ENCOUNTER — Ambulatory Visit: Payer: Self-pay

## 2021-06-15 ENCOUNTER — Encounter: Payer: Self-pay | Admitting: Emergency Medicine

## 2021-06-15 ENCOUNTER — Other Ambulatory Visit: Payer: Self-pay

## 2021-06-15 ENCOUNTER — Ambulatory Visit
Admission: EM | Admit: 2021-06-15 | Discharge: 2021-06-15 | Disposition: A | Discharge status: Home / Self Care | Payer: Medicaid Other | Attending: Family Medicine | Admitting: Family Medicine

## 2021-06-15 DIAGNOSIS — N898 Other specified noninflammatory disorders of vagina: Secondary | ICD-10-CM | POA: Insufficient documentation

## 2021-06-15 MED ORDER — CLINDAMYCIN HCL 300 MG PO CAPS: 300.0000 mg | ORAL_CAPSULE | Freq: Two times a day (BID) | ORAL | 0 refills | Status: DC

## 2021-06-15 NOTE — ED Triage Notes (Signed)
Patient c/o possibly having "BV", vaginal discharge, no color or odor to it.  Sx's been going on for a couple of days.

## 2021-06-15 NOTE — Discharge Instructions (Addendum)
We have sent testing for causes of vaginal infections. We will notify you of any positive results once they are received. If required, we will prescribe any medications you might need.  

## 2021-06-16 ENCOUNTER — Ambulatory Visit: Payer: Self-pay

## 2021-06-17 LAB — CERVICOVAGINAL ANCILLARY ONLY
Bacterial Vaginitis (gardnerella): POSITIVE — AB
Candida Glabrata: NEGATIVE
Candida Vaginitis: NEGATIVE
Chlamydia: NEGATIVE
Comment: NEGATIVE
Comment: NEGATIVE
Comment: NEGATIVE
Comment: NEGATIVE
Comment: NEGATIVE
Comment: NORMAL
Neisseria Gonorrhea: NEGATIVE
Trichomonas: NEGATIVE

## 2021-06-17 NOTE — ED Provider Notes (Signed)
  [facility]   [id number] 06/15/21 Arrival Time: 1004  ASSESSMENT & PLAN:  1. Vaginal discharge    Will tx empirically for BV. Cytology pending. Meds ordered this encounter  Medications   clindamycin (CLEOCIN) 300 MG capsule    Sig: Take 1 capsule (300 mg total) by mouth 2 (two) times daily.    Dispense:  14 capsule    Refill:  0      Discharge Instructions      We have sent testing for causes of vaginal infections. We will notify you of any positive results once they are received. If required, we will prescribe any medications you might need.     Without s/s of PID.  Labs Reviewed  CERVICOVAGINAL ANCILLARY ONLY   Will notify of any positive results. Instructed to refrain from sexual activity for at least seven days.  Reviewed expectations re: course of current medical issues. Questions answered. Outlined signs and symptoms indicating need for more acute intervention. Patient verbalized understanding. After Visit Summary given.   SUBJECTIVE:  Nicole Love is a 30 y.o. female who presents with complaint of vaginal discharge. H/O BV with same. Is sexually active. No specific aggravating or alleviating factors reported. Denies: urinary frequency, dysuria, and gross hematuria. Afebrile. No abdominal or pelvic pain. Normal PO intake wihout n/v. No genital rashes or lesions.  OTC treatment: none.   OBJECTIVE:  Vitals:   06/15/21 1015 06/15/21 1017  BP: 118/79   Pulse: 66   Temp: 98 F (36.7 C)   TempSrc: Oral   SpO2: 97%   Weight:  68 kg  Height:  5\' 5"  (1.651 m)     General appearance: alert, cooperative, appears stated age and no distress Lungs: unlabored respirations; speaks full sentences without difficulty Back: no CVA tenderness; FROM at waist Abdomen: soft, non-tender GU: deferred Skin: warm and dry Psychological: alert and cooperative; normal mood and affect.   Labs Reviewed  CERVICOVAGINAL ANCILLARY ONLY    No Known  Allergies  Past Medical History:  Diagnosis Date   Hyperthyroidism    Pregnancy    Family History  Problem Relation Age of Onset   Diabetes Maternal Grandmother    Social History   Socioeconomic History   Marital status: Significant Other    Spouse name: Not on file   Number of children: Not on file   Years of education: Not on file   Highest education level: Not on file  Occupational History   Not on file  Tobacco Use   Smoking status: Never   Smokeless tobacco: Never  Vaping Use   Vaping Use: Never used  Substance and Sexual Activity   Alcohol use: No   Drug use: No   Sexual activity: Yes    Birth control/protection: Surgical  Other Topics Concern   Not on file  Social History Narrative   ** Merged History Encounter **       Social Determinants of Health   Financial Resource Strain: Not on file  Food Insecurity: Not on file  Transportation Needs: Not on file  Physical Activity: Not on file  Stress: Not on file  Social Connections: Not on file  Intimate Partner Violence: Not on file           [place], MD 06/17/21 [phone]

## 2021-06-18 ENCOUNTER — Telehealth (HOSPITAL_COMMUNITY): Payer: Self-pay | Admitting: Emergency Medicine

## 2021-06-18 MED ORDER — FLUCONAZOLE 150 MG PO TABS: 150.0000 mg | ORAL_TABLET | Freq: Once | ORAL | 0 refills | Status: AC

## 2021-06-18 NOTE — Telephone Encounter (Signed)
Patient requested diflucan with recent antibiotic prescription.  Will send, per protocol.

## 2021-06-19 ENCOUNTER — Ambulatory Visit: Payer: Medicaid Other | Admitting: Family Medicine

## 2021-07-01 ENCOUNTER — Ambulatory Visit: Payer: Medicaid Other | Admitting: Family

## 2021-07-01 ENCOUNTER — Ambulatory Visit: Payer: Self-pay

## 2021-08-16 ENCOUNTER — Ambulatory Visit
Admission: EM | Admit: 2021-08-16 | Discharge: 2021-08-16 | Disposition: A | Discharge status: Home / Self Care | Payer: Medicaid Other | Attending: Internal Medicine | Admitting: Internal Medicine

## 2021-08-16 ENCOUNTER — Encounter: Payer: Self-pay | Admitting: Emergency Medicine

## 2021-08-16 ENCOUNTER — Other Ambulatory Visit: Payer: Self-pay

## 2021-08-16 DIAGNOSIS — N760 Acute vaginitis: Secondary | ICD-10-CM | POA: Insufficient documentation

## 2021-08-16 LAB — POCT URINALYSIS DIP (MANUAL ENTRY)
Bilirubin, UA: NEGATIVE
Blood, UA: NEGATIVE
Glucose, UA: NEGATIVE mg/dL
Ketones, POC UA: NEGATIVE mg/dL
Nitrite, UA: NEGATIVE
Protein Ur, POC: NEGATIVE mg/dL
Spec Grav, UA: 1.02 (ref 1.010–1.025)
Urobilinogen, UA: 0.2 E.U./dL
pH, UA: 8 (ref 5.0–8.0)

## 2021-08-16 MED ORDER — FLUCONAZOLE 150 MG PO TABS: 150.0000 mg | ORAL_TABLET | Freq: Once | ORAL | 0 refills | Status: AC

## 2021-08-16 MED ORDER — METRONIDAZOLE 500 MG PO TABS: 500.0000 mg | ORAL_TABLET | Freq: Two times a day (BID) | ORAL | 0 refills | Status: DC

## 2021-08-16 NOTE — ED Triage Notes (Signed)
States she didn't finish her BV meds, and the symptoms came back. Requesting to be re-tested/treated for BV. Also requesting referral for PCP and GYN

## 2021-08-16 NOTE — ED Provider Notes (Signed)
[facility]    CSN: [id number] Arrival date & time: 08/16/21  0814      History   Chief Complaint Chief Complaint  Patient presents with   Vaginal Discharge    HPI Nicole Love is a 30 y.o. female with a history of recurrent bacterial vaginosis comes to urgent care with 1 week history of thick vaginal discharge with occasional itching.  Patient was previously diagnosed with bacterial vaginosis but did not complete taking care prescribed medications.  She has some dysuria but no urgency or frequency.  No flank pain.  Vaginal discharge is not foul-smelling.  No abdominal pain.  Patient denies deep dyspareunia.   HPI  Past Medical History:  Diagnosis Date   Hyperthyroidism    Pregnancy     Patient Active Problem List   Diagnosis Date Noted   Post-dates pregnancy 10/14/2019   History of VBAC 08/03/2019   S/P tubal ligation 08/03/2019   Asymptomatic bacteriuria during pregnancy 03/28/2019   Supervision of other normal pregnancy, antepartum 03/23/2019   Thyroid disease affecting pregnancy    Previous cesarean delivery, antepartum 12/16/2017   Hyperthyroidism 10/22/2017    Past Surgical History:  Procedure Laterality Date   CESAREAN SECTION     TUBAL LIGATION N/A 05/17/2018   Procedure: POST PARTUM TUBAL LIGATION;  Surgeon: Hermina Staggers, MD;  Location: [facility];  Service: Gynecology;  Laterality: N/A;    OB History     Gravida  6   Para  5   Term  5   Preterm  0   AB  1   Living  5      SAB  1   IAB  0   Ectopic  0   Multiple      Live Births  5            Home Medications    Prior to Admission medications   Medication Sig Start Date End Date Taking? Authorizing Provider  fluconazole (DIFLUCAN) 150 MG tablet Take 1 tablet (150 mg total) by mouth once for 1 dose. 08/16/21 08/16/21 Yes Sophiah Rolin, Britta Mccreedy, MD  metroNIDAZOLE (FLAGYL) 500 MG tablet Take 1 tablet (500 mg total) by mouth 2 (two) times daily. 08/16/21   Yes Charlott Calvario, Britta Mccreedy, MD  AMBULATORY NON FORMULARY MEDICATION 1 Device by Other route once a week. Blood Pressure Cuff Medium Monitored Regularly at home ICD 10:Z34.90 03/23/19   Reva Bores, MD  fluticasone [facility]) 50 MCG/ACT nasal spray Place 1 spray into both nostrils daily. 07/23/20   Roxy Horseman, PA-C  Prenatal Vit-Fe Fumarate-FA (PRENATAL PO) Take by mouth.    [provider]  cetirizine (ZYRTEC ALLERGY) 10 MG tablet Take 1 tablet (10 mg total) by mouth daily. Patient not taking: Reported on 02/08/2021 06/16/20 03/06/21  Hall-Potvin, [place], PA-C  ferrous sulfate (FERROUSUL) 325 (65 FE) MG tablet Take 1 tablet (325 mg total) by mouth 2 (two) times daily. Patient not taking: Reported on 02/08/2021 08/05/19 03/06/21  Conan Bowens, MD    Family History Family History  Problem Relation Age of Onset   Diabetes Maternal Grandmother     Social History Social History   Tobacco Use   Smoking status: Never   Smokeless tobacco: Never  Vaping Use   Vaping Use: Never used  Substance Use Topics   Alcohol use: No   Drug use: No     Allergies   Patient has no known allergies.   Review of Systems Review of Systems  Gastrointestinal:  Positive for abdominal pain. Negative for nausea and vomiting.  Genitourinary:  Positive for dysuria and vaginal discharge. Negative for frequency, urgency, vaginal bleeding and vaginal pain.    Physical Exam Triage Vital Signs ED Triage Vitals  Enc Vitals Group     BP 08/16/21 0832 107/75     Pulse Rate 08/16/21 0832 81     Resp 08/16/21 0832 16     Temp 08/16/21 0832 97.7 F (36.5 C)     Temp Source 08/16/21 0832 Oral     SpO2 08/16/21 0832 96 %     Weight --      Height --      Head Circumference --      Peak Flow --      Pain Score 08/16/21 0835 0     Pain Loc --      Pain Edu? --      Excl. in [place]? --    No data found.  Updated Vital Signs BP 107/75 (BP Location: Right Arm)   Pulse 81   Temp 97.7 F (36.5 C)  (Oral)   Resp 16   SpO2 96%   Visual Acuity Right Eye Distance:   Left Eye Distance:   Bilateral Distance:    Right Eye Near:   Left Eye Near:    Bilateral Near:     Physical Exam Vitals and nursing note reviewed.  Constitutional:      General: She is not in acute distress.    Appearance: She is not ill-appearing.  Cardiovascular:     Rate and Rhythm: Normal rate and regular rhythm.     Pulses: Normal pulses.     Heart sounds: Normal heart sounds.  Pulmonary:     Effort: Pulmonary effort is normal.     Breath sounds: Normal breath sounds.  Abdominal:     General: Bowel sounds are normal.     Palpations: Abdomen is soft.  Neurological:     Mental Status: She is alert.     UC Treatments / Results  Labs (all labs ordered are listed, but only abnormal results are displayed) Labs Reviewed  POCT URINALYSIS DIP (MANUAL ENTRY) - Abnormal; Notable for the following components:      Result Value   Leukocytes, UA Trace (*)    All other components within normal limits  CERVICOVAGINAL ANCILLARY ONLY    EKG   Radiology No results found.  Procedures Procedures (including critical care time)  Medications Ordered in UC Medications - No data to display  Initial Impression / Assessment and Plan / UC Course  I have reviewed the triage vital signs and the nursing notes.  Pertinent labs & imaging results that were available during my care of the patient were reviewed by me and considered in my medical decision making (see chart for details).     1.  Acute vaginitis: Cervical vaginal swab for BV/vaginal yeast Fluconazole and metronidazole have been prescribed Point-of-care urinalysis is negative for UTI If symptoms worsen patient is advised to return to urgent care to be reevaluated We will call patient with recommendations if labs are abnormal. Final Clinical Impressions(s) / UC Diagnoses   Final diagnoses:  Acute vaginitis     Discharge Instructions       Please take medications as prescribed We will call you with recommendations if lab results are abnormal Return to urgent care if symptoms worsen.   ED Prescriptions     Medication Sig Dispense Auth. Provider   metroNIDAZOLE (FLAGYL)  500 MG tablet Take 1 tablet (500 mg total) by mouth 2 (two) times daily. 14 tablet [name] Zody, Britta Mccreedy, MD   fluconazole (DIFLUCAN) 150 MG tablet Take 1 tablet (150 mg total) by mouth once for 1 dose. 2 tablet Karlissa Aron, Britta Mccreedy, MD      PDMP not reviewed this encounter.   Merrilee Jansky, MD 08/16/21 [phone]

## 2021-08-16 NOTE — Discharge Instructions (Addendum)
Please take medications as prescribed We will call you with recommendations if lab results are abnormal Return to urgent care if symptoms worsen.

## 2021-08-19 LAB — CERVICOVAGINAL ANCILLARY ONLY
Bacterial Vaginitis (gardnerella): POSITIVE — AB
Candida Glabrata: NEGATIVE
Candida Vaginitis: NEGATIVE
Comment: NEGATIVE
Comment: NEGATIVE
Comment: NEGATIVE

## 2021-08-27 ENCOUNTER — Ambulatory Visit: Payer: Self-pay

## 2021-09-04 ENCOUNTER — Ambulatory Visit: Payer: Self-pay

## 2021-09-15 ENCOUNTER — Ambulatory Visit: Payer: Self-pay

## 2021-09-16 ENCOUNTER — Ambulatory Visit: Payer: Self-pay

## 2021-09-21 ENCOUNTER — Ambulatory Visit: Payer: Self-pay

## 2021-09-22 ENCOUNTER — Other Ambulatory Visit: Payer: Self-pay

## 2021-09-22 ENCOUNTER — Ambulatory Visit
Admission: RE | Admit: 2021-09-22 | Discharge: 2021-09-22 | Disposition: A | Discharge status: Home / Self Care | Payer: Medicaid Other | Source: Ambulatory Visit | Attending: Internal Medicine | Admitting: Internal Medicine

## 2021-09-22 VITALS — BP 130/87 | HR 93 | Temp 98.0°F | Resp 18

## 2021-09-22 DIAGNOSIS — Z113 Encounter for screening for infections with a predominantly sexual mode of transmission: Secondary | ICD-10-CM | POA: Diagnosis not present

## 2021-09-22 DIAGNOSIS — N898 Other specified noninflammatory disorders of vagina: Secondary | ICD-10-CM | POA: Insufficient documentation

## 2021-09-22 LAB — POCT URINALYSIS DIP (MANUAL ENTRY)
Bilirubin, UA: NEGATIVE
Blood, UA: NEGATIVE
Glucose, UA: NEGATIVE mg/dL
Ketones, POC UA: NEGATIVE mg/dL
Nitrite, UA: NEGATIVE
Protein Ur, POC: 30 mg/dL — AB
Spec Grav, UA: 1.015 (ref 1.010–1.025)
Urobilinogen, UA: 0.2 E.U./dL
pH, UA: 8.5 — AB (ref 5.0–8.0)

## 2021-09-22 LAB — POCT URINE PREGNANCY: Preg Test, Ur: NEGATIVE

## 2021-09-22 MED ORDER — METRONIDAZOLE 500 MG PO TABS: 500.0000 mg | ORAL_TABLET | Freq: Two times a day (BID) | ORAL | 0 refills | Status: DC

## 2021-09-22 NOTE — ED Triage Notes (Signed)
Pt reports Pain on Lt side near ovary and then had white vag discharge the patient reports she can feel pain in her hip.

## 2021-09-22 NOTE — ED Provider Notes (Signed)
[facility]    CSN: [id number] Arrival date & time: 09/22/21  1045      History   Chief Complaint Chief Complaint  Patient presents with   Vaginal Discharge    HPI Nicole Love is a 30 y.o. female.   Patient presents with white vaginal discharge, left lower abdominal pain, left "rib pain" that started approximately 1 week ago.  Denies any urinary burning, urinary frequency, irregular vaginal bleeding, hematuria, back pain, fever.  Denies any known exposure to STD but has had unprotected sexual intercourse recently.  Last menstrual cycle was 09/03/2021.  Pain is intermittent and described as sharp pain, but patient denies that she has any pain currently.  Pain is rated 8/10 on pain scale when it does occur.   Vaginal Discharge  Past Medical History:  Diagnosis Date   Hyperthyroidism    Pregnancy     Patient Active Problem List   Diagnosis Date Noted   Post-dates pregnancy 10/14/2019   History of VBAC 08/03/2019   S/P tubal ligation 08/03/2019   Asymptomatic bacteriuria during pregnancy 03/28/2019   Supervision of other normal pregnancy, antepartum 03/23/2019   Thyroid disease affecting pregnancy    Previous cesarean delivery, antepartum 12/16/2017   Hyperthyroidism 10/22/2017    Past Surgical History:  Procedure Laterality Date   CESAREAN SECTION     TUBAL LIGATION N/A 05/17/2018   Procedure: POST PARTUM TUBAL LIGATION;  Surgeon: Chancy Milroy, MD;  Location: [place];  Service: Gynecology;  Laterality: N/A;    OB History     Gravida  6   Para  5   Term  5   Preterm  0   AB  1   Living  5      SAB  1   IAB  0   Ectopic  0   Multiple      Live Births  5            Home Medications    Prior to Admission medications   Medication Sig Start Date End Date Taking? Authorizing Provider  metroNIDAZOLE (FLAGYL) 500 MG tablet Take 1 tablet (500 mg total) by mouth 2 (two) times daily. 09/22/21  Yes Taray Normoyle, [place] E,  FNP  AMBULATORY NON FORMULARY MEDICATION 1 Device by Other route once a week. Blood Pressure Cuff Medium Monitored Regularly at home ICD 10:Z34.90 03/23/19   Donnamae Jude, MD  fluticasone [facility]) 50 MCG/ACT nasal spray Place 1 spray into both nostrils daily. 07/23/20   Montine Circle, PA-C  Prenatal Vit-Fe Fumarate-FA (PRENATAL PO) Take by mouth.    [provider]  cetirizine (ZYRTEC ALLERGY) 10 MG tablet Take 1 tablet (10 mg total) by mouth daily. Patient not taking: Reported on 02/08/2021 06/16/20 03/06/21  Hall-Potvin, [place], PA-C  ferrous sulfate (FERROUSUL) 325 (65 FE) MG tablet Take 1 tablet (325 mg total) by mouth 2 (two) times daily. Patient not taking: Reported on 02/08/2021 08/05/19 03/06/21  Sloan Leiter, MD    Family History Family History  Problem Relation Age of Onset   Diabetes Maternal Grandmother     Social History Social History   Tobacco Use   Smoking status: Never   Smokeless tobacco: Never  Vaping Use   Vaping Use: Never used  Substance Use Topics   Alcohol use: No   Drug use: No     Allergies   Patient has no known allergies.   Review of Systems Review of Systems Per HPI  Physical Exam Triage  Vital Signs ED Triage Vitals  Enc Vitals Group     BP 09/22/21 1054 130/87     Pulse Rate 09/22/21 1054 93     Resp 09/22/21 1054 18     Temp 09/22/21 1054 98 F (36.7 C)     Temp src --      SpO2 09/22/21 1054 99 %     Weight --      Height --      Head Circumference --      Peak Flow --      Pain Score 09/22/21 1056 8     Pain Loc --      Pain Edu? --      Excl. in [place]? --    No data found.  Updated Vital Signs BP 130/87    Pulse 93    Temp 98 F (36.7 C)    Resp 18    LMP 09/02/2021    SpO2 99%   Visual Acuity Right Eye Distance:   Left Eye Distance:   Bilateral Distance:    Right Eye Near:   Left Eye Near:    Bilateral Near:     Physical Exam Exam conducted with a chaperone present.  Constitutional:      General:  She is not in acute distress.    Appearance: Normal appearance. She is diaphoretic. She is not toxic-appearing.  HENT:     Head: Normocephalic and atraumatic.  Eyes:     Extraocular Movements: Extraocular movements intact.     Conjunctiva/sclera: Conjunctivae normal.  Cardiovascular:     Rate and Rhythm: Normal rate and regular rhythm.     Pulses: Normal pulses.     Heart sounds: Normal heart sounds.  Pulmonary:     Effort: Pulmonary effort is normal. No respiratory distress.     Breath sounds: Normal breath sounds.  Abdominal:     General: Abdomen is flat. Bowel sounds are normal. There is no distension.     Palpations: Abdomen is soft.     Tenderness: There is no abdominal tenderness. There is no right CVA tenderness, left CVA tenderness, guarding or rebound. Negative signs include Murphy's sign, Rovsing's sign, McBurney's sign, psoas sign and obturator sign.  Genitourinary:    Vagina: No erythema or tenderness.     Cervix: No cervical motion tenderness or erythema.  Neurological:     General: No focal deficit present.     Mental Status: She is alert and oriented to person, place, and time. Mental status is at baseline.  Psychiatric:        Mood and Affect: Mood normal.        Behavior: Behavior normal.        Thought Content: Thought content normal.        Judgment: Judgment normal.     UC Treatments / Results  Labs (all labs ordered are listed, but only abnormal results are displayed) Labs Reviewed  POCT URINALYSIS DIP (MANUAL ENTRY) - Abnormal; Notable for the following components:      Result Value   pH, UA 8.5 (*)    Protein Ur, POC =30 (*)    Leukocytes, UA Trace (*)    All other components within normal limits  URINE CULTURE  POCT URINE PREGNANCY  CERVICOVAGINAL ANCILLARY ONLY    EKG   Radiology No results found.  Procedures Procedures (including critical care time)  Medications Ordered in UC Medications - No data to display  Initial Impression /  Assessment and Plan /  UC Course  I have reviewed the triage vital signs and the nursing notes.  Pertinent labs & imaging results that were available during my care of the patient were reviewed by me and considered in my medical decision making (see chart for details).     Urine pregnancy was negative.  Urinalysis not definitive for urinary tract infection.  Urine culture is pending.  Cervicovaginal swab pending.  Suggested the patient go to the hospital for further evaluation and management to rule out ovarian torsion, although there is low suspicion for this.  Patient declined going to the hospital.  Risks associated with not going to hospital were discussed with patient.  Pelvic exam was performed with no abdominal tenderness, adnexal tenderness, no cervical motion tenderness.  Low suspicion for PID on exam.  Patient does have recurrent bacterial vaginosis so will treat with metronidazole as this seems consistent with patient's physical exam.  Discussed strict return precautions and advised patient to go to the hospital if no improvement in symptoms or if they worsen in the next 24 to 48 hours.  Patient verbalized understanding and was agreeable with plan. Final Clinical Impressions(s) / UC Diagnoses   Final diagnoses:  Vaginal discharge  Screening examination for venereal disease     Discharge Instructions      It is suspected that you have bacterial vaginosis which is being treated with metronidazole antibiotic.  Please go to the hospital if no improvement in symptoms in the next 24 to 48 hours.    ED Prescriptions     Medication Sig Dispense Auth. Provider   metroNIDAZOLE (FLAGYL) 500 MG tablet Take 1 tablet (500 mg total) by mouth 2 (two) times daily. 14 tablet Tripp, Acie Fredrickson, [place]      PDMP not reviewed this encounter.   Gustavus Bryant, [place] 09/22/21 1150

## 2021-09-22 NOTE — Discharge Instructions (Signed)
It is suspected that you have bacterial vaginosis which is being treated with metronidazole antibiotic.  Please go to the hospital if no improvement in symptoms in the next 24 to 48 hours.

## 2021-09-23 LAB — URINE CULTURE: Culture: NO GROWTH

## 2021-09-23 LAB — CERVICOVAGINAL ANCILLARY ONLY
Bacterial Vaginitis (gardnerella): POSITIVE — AB
Candida Glabrata: NEGATIVE
Candida Vaginitis: NEGATIVE
Chlamydia: NEGATIVE
Comment: NEGATIVE
Comment: NEGATIVE
Comment: NEGATIVE
Comment: NEGATIVE
Comment: NEGATIVE
Comment: NORMAL
Neisseria Gonorrhea: POSITIVE — AB
Trichomonas: NEGATIVE

## 2021-09-25 ENCOUNTER — Ambulatory Visit
Admission: RE | Admit: 2021-09-25 | Discharge: 2021-09-25 | Disposition: A | Discharge status: Home / Self Care | Payer: Medicaid Other | Source: Ambulatory Visit | Attending: Internal Medicine | Admitting: Internal Medicine

## 2021-09-25 ENCOUNTER — Other Ambulatory Visit: Payer: Self-pay

## 2021-09-25 DIAGNOSIS — A549 Gonococcal infection, unspecified: Secondary | ICD-10-CM | POA: Diagnosis not present

## 2021-09-25 MED ORDER — CEFTRIAXONE SODIUM 1 G IJ SOLR
0.5000 g | Freq: Once | INTRAMUSCULAR | Status: AC
  Administered 2021-09-25: 12:00:00 0.5 g via INTRAMUSCULAR

## 2021-09-25 NOTE — ED Triage Notes (Signed)
Pt here for rn visit 2/2 pos sti result. Medication ordered and administered per protocol without complication. Education provided.

## 2021-10-12 ENCOUNTER — Ambulatory Visit: Payer: Self-pay

## 2021-10-19 ENCOUNTER — Ambulatory Visit: Payer: Self-pay

## 2021-10-21 ENCOUNTER — Ambulatory Visit
Admission: RE | Admit: 2021-10-21 | Discharge: 2021-10-21 | Disposition: A | Discharge status: Home / Self Care | Payer: Medicaid Other | Source: Ambulatory Visit | Attending: Physician Assistant | Admitting: Physician Assistant

## 2021-10-21 ENCOUNTER — Other Ambulatory Visit: Payer: Self-pay

## 2021-10-21 VITALS — BP 136/85 | HR 72 | Temp 98.0°F | Resp 16

## 2021-10-21 DIAGNOSIS — Z113 Encounter for screening for infections with a predominantly sexual mode of transmission: Secondary | ICD-10-CM | POA: Diagnosis not present

## 2021-10-21 NOTE — ED Provider Notes (Signed)
[facility]    CSN: [id number] Arrival date & time: 10/21/21  0953      History   Chief Complaint Chief Complaint  Patient presents with   SEXUALLY TRANSMITTED DISEASE    HPI Nicole Love is a 31 y.o. female.   Patient here today for evaluation of possible gonorrhea re-infection. She reports her symptoms are similar to prior gonorrhea infection and she does not feel her partner was treated for same after recent diagnosis. She reports suprapubic Love, vaginal discharge and spotting. She would like STD screening.   The history is provided by the patient.   Past Medical History:  Diagnosis Date   Hyperthyroidism    Pregnancy     Patient Active Problem List   Diagnosis Date Noted   Post-dates pregnancy 10/14/2019   History of VBAC 08/03/2019   S/P tubal ligation 08/03/2019   Asymptomatic bacteriuria during pregnancy 03/28/2019   Supervision of other normal pregnancy, antepartum 03/23/2019   Thyroid disease affecting pregnancy    Previous cesarean delivery, antepartum 12/16/2017   Hyperthyroidism 10/22/2017    Past Surgical History:  Procedure Laterality Date   CESAREAN SECTION     TUBAL LIGATION N/A 05/17/2018   Procedure: POST PARTUM TUBAL LIGATION;  Surgeon: Hermina Staggers, MD;  Location: [facility];  Service: Gynecology;  Laterality: N/A;    OB History     Gravida  6   Para  5   Term  5   Preterm  0   AB  1   Living  5      SAB  1   IAB  0   Ectopic  0   Multiple      Live Births  5            Home Medications    Prior to Admission medications   Medication Sig Start Date End Date Taking? Authorizing Provider  AMBULATORY NON FORMULARY MEDICATION 1 Device by Other route once a week. Blood Pressure Cuff Medium Monitored Regularly at home ICD 10:Z34.90 03/23/19   Reva Bores, MD  fluticasone [facility]) 50 MCG/ACT nasal spray Place 1 spray into both nostrils daily. 07/23/20   Roxy Horseman, PA-C   metroNIDAZOLE (FLAGYL) 500 MG tablet Take 1 tablet (500 mg total) by mouth 2 (two) times daily. 09/22/21   Gustavus Bryant, FNP  Prenatal Vit-Fe Fumarate-FA (PRENATAL PO) Take by mouth.    [provider]  cetirizine (ZYRTEC ALLERGY) 10 MG tablet Take 1 tablet (10 mg total) by mouth daily. Patient not taking: Reported on 02/08/2021 06/16/20 03/06/21  Hall-Potvin, [place], PA-C  ferrous sulfate (FERROUSUL) 325 (65 FE) MG tablet Take 1 tablet (325 mg total) by mouth 2 (two) times daily. Patient not taking: Reported on 02/08/2021 08/05/19 03/06/21  Conan Bowens, MD    Family History Family History  Problem Relation Age of Onset   Diabetes Maternal Grandmother     Social History Social History   Tobacco Use   Smoking status: Never   Smokeless tobacco: Never  Vaping Use   Vaping Use: Never used  Substance Use Topics   Alcohol use: No   Drug use: No     Allergies   Patient has no known allergies.   Review of Systems Review of Systems  Constitutional:  Negative for chills and fever.  Eyes:  Negative for discharge and redness.  Respiratory:  Negative for shortness of breath.   Gastrointestinal:  Positive for abdominal Love. Negative for nausea and  vomiting.  Genitourinary:  Positive for vaginal bleeding and vaginal discharge.  Musculoskeletal:  Negative for back Love.    Physical Exam Triage Vital Signs ED Triage Vitals  Enc Vitals Group     BP 10/21/21 1003 136/85     Pulse Rate 10/21/21 1003 72     Resp 10/21/21 1003 16     Temp 10/21/21 1003 98 F (36.7 C)     Temp Source 10/21/21 1003 Oral     SpO2 10/21/21 1003 99 %     Weight --      Height --      Head Circumference --      Peak Flow --      Love Score 10/21/21 1004 3     Love Loc --      Love Edu? --      Excl. in [place]? --    No data found.  Updated Vital Signs BP 136/85 (BP Location: Left Arm)    Pulse 72    Temp 98 F (36.7 C) (Oral)    Resp 16    SpO2 99%      Physical Exam Vitals and nursing  note reviewed.  Constitutional:      General: She is not in acute distress.    Appearance: Normal appearance. She is not ill-appearing.  HENT:     Head: Normocephalic and atraumatic.  Eyes:     Conjunctiva/sclera: Conjunctivae normal.  Cardiovascular:     Rate and Rhythm: Normal rate.  Pulmonary:     Effort: Pulmonary effort is normal.  Neurological:     Mental Status: She is alert.  Psychiatric:        Mood and Affect: Mood normal.        Behavior: Behavior normal.        Thought Content: Thought content normal.     UC Treatments / Results  Labs (all labs ordered are listed, but only abnormal results are displayed) Labs Reviewed  CERVICOVAGINAL ANCILLARY ONLY    EKG   Radiology No results found.  Procedures Procedures (including critical care time)  Medications Ordered in UC Medications - No data to display  Initial Impression / Assessment and Plan / UC Course  I have reviewed the triage vital signs and the nursing notes.  Pertinent labs & imaging results that were available during my care of the patient were reviewed by me and considered in my medical decision making (see chart for details).    STD screening ordered as requested. Will await results for further recommendation. Encouraged follow up with any further concerns.   Final Clinical Impressions(s) / UC Diagnoses   Final diagnoses:  Screening for STD (sexually transmitted disease)   Discharge Instructions   None    ED Prescriptions   None    PDMP not reviewed this encounter.   Tomi Bamberger, PA-C 10/21/21 1102

## 2021-10-21 NOTE — ED Triage Notes (Addendum)
C/o abdominal pain, spotting in between period, new vaginal discharge - states these are the same symptoms she had when she tested positive for gonorrhea. Reports suprapubic pain, 3/10. Denies changes to urine/stool, fever, change in appetite, weight loss. Symptoms on-going x 4 days. Believes her partner never got treated for his infection

## 2021-10-22 ENCOUNTER — Telehealth (HOSPITAL_COMMUNITY): Payer: Self-pay | Admitting: Emergency Medicine

## 2021-10-22 LAB — CERVICOVAGINAL ANCILLARY ONLY
Bacterial Vaginitis (gardnerella): POSITIVE — AB
Candida Glabrata: NEGATIVE
Candida Vaginitis: NEGATIVE
Chlamydia: NEGATIVE
Comment: NEGATIVE
Comment: NEGATIVE
Comment: NEGATIVE
Comment: NEGATIVE
Comment: NEGATIVE
Comment: NORMAL
Neisseria Gonorrhea: NEGATIVE
Trichomonas: NEGATIVE

## 2021-10-22 MED ORDER — METRONIDAZOLE 500 MG PO TABS: 500.0000 mg | ORAL_TABLET | Freq: Two times a day (BID) | ORAL | 0 refills | Status: DC

## 2021-11-20 ENCOUNTER — Ambulatory Visit: Payer: Self-pay

## 2021-11-21 ENCOUNTER — Ambulatory Visit: Payer: Self-pay

## 2021-11-22 ENCOUNTER — Ambulatory Visit: Payer: Self-pay

## 2021-11-23 ENCOUNTER — Ambulatory Visit: Payer: Self-pay

## 2021-12-02 ENCOUNTER — Ambulatory Visit: Payer: Self-pay

## 2021-12-03 ENCOUNTER — Ambulatory Visit: Payer: Self-pay

## 2021-12-06 ENCOUNTER — Ambulatory Visit: Payer: Self-pay

## 2021-12-07 ENCOUNTER — Ambulatory Visit: Payer: Self-pay

## 2021-12-08 ENCOUNTER — Ambulatory Visit
Admission: RE | Admit: 2021-12-08 | Discharge: 2021-12-08 | Disposition: A | Discharge status: Home / Self Care | Payer: Medicaid Other | Source: Ambulatory Visit | Attending: Physician Assistant | Admitting: Physician Assistant

## 2021-12-08 ENCOUNTER — Other Ambulatory Visit: Payer: Self-pay

## 2021-12-08 VITALS — BP 133/84 | HR 81 | Temp 97.6°F | Resp 18

## 2021-12-08 DIAGNOSIS — N898 Other specified noninflammatory disorders of vagina: Secondary | ICD-10-CM | POA: Insufficient documentation

## 2021-12-08 DIAGNOSIS — Z113 Encounter for screening for infections with a predominantly sexual mode of transmission: Secondary | ICD-10-CM | POA: Insufficient documentation

## 2021-12-08 LAB — POCT URINE PREGNANCY: Preg Test, Ur: NEGATIVE

## 2021-12-08 MED ORDER — METRONIDAZOLE 500 MG PO TABS: 500.0000 mg | ORAL_TABLET | Freq: Two times a day (BID) | ORAL | 0 refills | Status: DC

## 2021-12-08 NOTE — ED Provider Notes (Signed)
?[facility] ? ? ? ?CSN: [id number] ?Arrival date & time: 12/08/21  0857 ? ? ?  ? ?History   ?Chief Complaint ?Chief Complaint  ?Patient presents with  ? 9a appt  ? Vaginal Discharge  ? ? ?HPI ?Nicole Love is a 31 y.o. female.  ? ?Patient here today for evaluation of vaginal discharge has been ongoing for the last week.  She states she has recurrent BV typically that appears after she completes her menstrual period.  She denies any vaginal odor, itching or irritation.  She has not had any abnormal vaginal bleeding.  She denies any urinary symptoms. ? ?The history is provided by the patient.  ? ?Past Medical History:  ?Diagnosis Date  ? Hyperthyroidism   ? Pregnancy   ? ? ?Patient Active Problem List  ? Diagnosis Date Noted  ? Post-dates pregnancy 10/14/2019  ? History of VBAC 08/03/2019  ? S/P tubal ligation 08/03/2019  ? Asymptomatic bacteriuria during pregnancy 03/28/2019  ? Supervision of other normal pregnancy, antepartum 03/23/2019  ? Thyroid disease affecting pregnancy   ? Previous cesarean delivery, antepartum 12/16/2017  ? Hyperthyroidism 10/22/2017  ? ? ?Past Surgical History:  ?Procedure Laterality Date  ? CESAREAN SECTION    ? TUBAL LIGATION N/A 05/17/2018  ? Procedure: POST PARTUM TUBAL LIGATION;  Surgeon: Hermina Staggers, MD;  Location: [facility];  Service: Gynecology;  Laterality: N/A;  ? ? ?OB History   ? ? Gravida  ?6  ? Para  ?5  ? Term  ?5  ? Preterm  ?0  ? AB  ?1  ? Living  ?5  ?  ? ? SAB  ?1  ? IAB  ?0  ? Ectopic  ?0  ? Multiple  ?   ? Live Births  ?5  ?   ?  ?  ? ? ? ?Home Medications   ? ?Prior to Admission medications   ?Medication Sig Start Date End Date Taking? Authorizing Provider  ?metroNIDAZOLE (FLAGYL) 500 MG tablet Take 1 tablet (500 mg total) by mouth 2 (two) times daily. 12/08/21  Yes Tomi Bamberger, PA-C  ?AMBULATORY NON FORMULARY MEDICATION 1 Device by Other route once a week. Blood Pressure Cuff Medium ?Monitored Regularly at home ?ICD 10:Z34.90 03/23/19    Reva Bores, MD  ?fluticasone Aleda Grana) 50 MCG/ACT nasal spray Place 1 spray into both nostrils daily. 07/23/20   Roxy Horseman, PA-C  ?Prenatal Vit-Fe Fumarate-FA (PRENATAL PO) Take by mouth.    [provider]  ?cetirizine (ZYRTEC ALLERGY) 10 MG tablet Take 1 tablet (10 mg total) by mouth daily. ?Patient not taking: Reported on 02/08/2021 06/16/20 03/06/21  Hall-Potvin, [place], PA-C  ?ferrous sulfate (FERROUSUL) 325 (65 FE) MG tablet Take 1 tablet (325 mg total) by mouth 2 (two) times daily. ?Patient not taking: Reported on 02/08/2021 08/05/19 03/06/21  Conan Bowens, MD  ? ? ?Family History ?Family History  ?Problem Relation Age of Onset  ? Diabetes Maternal Grandmother   ? ? ?Social History ?Social History  ? ?Tobacco Use  ? Smoking status: Never  ? Smokeless tobacco: Never  ?Vaping Use  ? Vaping Use: Never used  ?Substance Use Topics  ? Alcohol use: No  ? Drug use: No  ? ? ? ?Allergies   ?Patient has no known allergies. ? ? ?Review of Systems ?Review of Systems  ?Constitutional:  Negative for chills and fever.  ?Eyes:  Negative for discharge and redness.  ?Gastrointestinal:  Negative for abdominal pain, nausea and  vomiting.  ?Genitourinary:  Positive for vaginal discharge. Negative for dysuria and vaginal bleeding.  ?Musculoskeletal:  Negative for back pain.  ? ? ?Physical Exam ?Triage Vital Signs ?ED Triage Vitals  ?Enc Vitals Group  ?   BP   ?   Pulse   ?   Resp   ?   Temp   ?   Temp src   ?   SpO2   ?   Weight   ?   Height   ?   Head Circumference   ?   Peak Flow   ?   Pain Score   ?   Pain Loc   ?   Pain Edu?   ?   Excl. in [place]?   ? ?No data found. ? ?Updated Vital Signs ?BP 133/84 (BP Location: Right Arm)   Pulse 81   Temp 97.6 ?F (36.4 ?C) (Oral)   Resp 18   LMP 12/02/2021 (Approximate)   SpO2 97%  ?   ? ?Physical Exam ?Vitals and nursing note reviewed.  ?Constitutional:   ?   General: She is not in acute distress. ?   Appearance: Normal appearance. She is not ill-appearing.  ?HENT:  ?    Head: Normocephalic and atraumatic.  ?Eyes:  ?   Conjunctiva/sclera: Conjunctivae normal.  ?Cardiovascular:  ?   Rate and Rhythm: Normal rate.  ?Pulmonary:  ?   Effort: Pulmonary effort is normal.  ?Neurological:  ?   Mental Status: She is alert.  ?Psychiatric:     ?   Mood and Affect: Mood normal.     ?   Behavior: Behavior normal.     ?   Thought Content: Thought content normal.  ? ? ? ?UC Treatments / Results  ?Labs ?(all labs ordered are listed, but only abnormal results are displayed) ?Labs Reviewed  ?POCT URINE PREGNANCY  ?CERVICOVAGINAL ANCILLARY ONLY  ? ? ?EKG ? ? ?Radiology ?No results found. ? ?Procedures ?Procedures (including critical care time) ? ?Medications Ordered in UC ?Medications - No data to display ? ?Initial Impression / Assessment and Plan / UC Course  ?I have reviewed the triage vital signs and the nursing notes. ? ?Pertinent labs & imaging results that were available during my care of the patient were reviewed by me and considered in my medical decision making (see chart for details). ? ?  ?Chart review reveals BV almost monthly.  Will treat with metronidazole while awaiting results of cervical vaginal swab, and recommended further evaluation by GYN given recurrent issues.  Patient expresses understanding.  Encouraged follow-up with any further concerns. ? ?Final Clinical Impressions(s) / UC Diagnoses  ? ?Final diagnoses:  ?Screening for STD (sexually transmitted disease)  ?Vaginal discharge  ? ?Discharge Instructions   ?None ?  ? ?ED Prescriptions   ? ? Medication Sig Dispense Auth. Provider  ? metroNIDAZOLE (FLAGYL) 500 MG tablet Take 1 tablet (500 mg total) by mouth 2 (two) times daily. 14 tablet Tomi Bamberger, PA-C  ? ?  ? ?PDMP not reviewed this encounter. ?  ?Tomi Bamberger, PA-C ?12/08/21 1103 ? ?

## 2021-12-08 NOTE — ED Triage Notes (Signed)
One week h/o vaginal discharge. Pt reports h/o recurrent bv that appears 1-2 days after her MP. Denies vaginal odor, itching and irritation. No abnormal vaginal bleeding. No urinary sxs. ?

## 2021-12-09 LAB — CERVICOVAGINAL ANCILLARY ONLY
Bacterial Vaginitis (gardnerella): POSITIVE — AB
Candida Glabrata: NEGATIVE
Candida Vaginitis: POSITIVE — AB
Chlamydia: NEGATIVE
Comment: NEGATIVE
Comment: NEGATIVE
Comment: NEGATIVE
Comment: NEGATIVE
Comment: NEGATIVE
Comment: NORMAL
Neisseria Gonorrhea: NEGATIVE
Trichomonas: NEGATIVE

## 2021-12-10 ENCOUNTER — Telehealth (HOSPITAL_COMMUNITY): Payer: Self-pay | Admitting: Emergency Medicine

## 2021-12-10 MED ORDER — FLUCONAZOLE 150 MG PO TABS: 150.0000 mg | ORAL_TABLET | Freq: Once | ORAL | 0 refills | Status: AC

## 2021-12-20 ENCOUNTER — Ambulatory Visit: Payer: Self-pay

## 2022-01-21 ENCOUNTER — Ambulatory Visit
Admission: RE | Admit: 2022-01-21 | Discharge: 2022-01-21 | Disposition: A | Discharge status: Home / Self Care | Payer: Medicaid Other | Source: Ambulatory Visit | Attending: Internal Medicine | Admitting: Internal Medicine

## 2022-01-21 ENCOUNTER — Ambulatory Visit (INDEPENDENT_AMBULATORY_CARE_PROVIDER_SITE_OTHER): Payer: Medicaid Other

## 2022-01-21 ENCOUNTER — Other Ambulatory Visit: Payer: Self-pay

## 2022-01-21 DIAGNOSIS — S51852A Open bite of left forearm, initial encounter: Secondary | ICD-10-CM

## 2022-01-21 DIAGNOSIS — M25511 Pain in right shoulder: Secondary | ICD-10-CM | POA: Diagnosis not present

## 2022-01-21 DIAGNOSIS — Z23 Encounter for immunization: Secondary | ICD-10-CM

## 2022-01-21 DIAGNOSIS — S51859A Open bite of unspecified forearm, initial encounter: Secondary | ICD-10-CM

## 2022-01-21 MED ORDER — TETANUS-DIPHTH-ACELL PERTUSSIS 5-2.5-18.5 LF-MCG/0.5 IM SUSY
0.5000 mL | PREFILLED_SYRINGE | Freq: Once | INTRAMUSCULAR | Status: AC
  Administered 2022-01-21: 0.5 mL via INTRAMUSCULAR

## 2022-01-21 MED ORDER — AMOXICILLIN-POT CLAVULANATE 875-125 MG PO TABS: 1.0000 | ORAL_TABLET | Freq: Two times a day (BID) | ORAL | 0 refills | Status: DC
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
## 2022-02-13 ENCOUNTER — Ambulatory Visit: Payer: Self-pay

## 2022-02-25 ENCOUNTER — Ambulatory Visit: Payer: Self-pay

## 2022-03-04 ENCOUNTER — Ambulatory Visit: Payer: Self-pay

## 2022-03-05 ENCOUNTER — Ambulatory Visit
Admission: RE | Admit: 2022-03-05 | Discharge: 2022-03-05 | Disposition: A | Discharge status: Home / Self Care | Payer: Medicaid Other | Source: Ambulatory Visit | Attending: Urgent Care | Admitting: Urgent Care

## 2022-03-05 VITALS — BP 141/73 | HR 76 | Temp 98.2°F | Resp 18

## 2022-03-05 DIAGNOSIS — N760 Acute vaginitis: Secondary | ICD-10-CM | POA: Insufficient documentation

## 2022-03-05 DIAGNOSIS — B9689 Other specified bacterial agents as the cause of diseases classified elsewhere: Secondary | ICD-10-CM | POA: Insufficient documentation

## 2022-03-05 DIAGNOSIS — Z113 Encounter for screening for infections with a predominantly sexual mode of transmission: Secondary | ICD-10-CM | POA: Diagnosis not present

## 2022-03-05 LAB — POCT URINALYSIS DIP (MANUAL ENTRY)
Bilirubin, UA: NEGATIVE
Glucose, UA: NEGATIVE mg/dL
Ketones, POC UA: NEGATIVE mg/dL
Nitrite, UA: NEGATIVE
Protein Ur, POC: NEGATIVE mg/dL
Spec Grav, UA: 1.02 (ref 1.010–1.025)
Urobilinogen, UA: 0.2 E.U./dL
pH, UA: 7 (ref 5.0–8.0)

## 2022-03-05 MED ORDER — METRONIDAZOLE 0.75 % VA GEL: 1.0000 | Freq: Two times a day (BID) | VAGINAL | 0 refills | Status: DC

## 2022-03-05 MED ORDER — TINIDAZOLE 500 MG PO TABS: 2.0000 g | ORAL_TABLET | Freq: Every day | ORAL | 0 refills | Status: AC

## 2022-03-05 NOTE — ED Triage Notes (Signed)
Pt c/o always having vaginal discharge after mensis that is dx as BV. Also requests sti screening.

## 2022-03-05 NOTE — Discharge Instructions (Addendum)
You were tested today for gonorrhea, chlamydia, trichomonas, BV, and yeast. We will call you with the results of your test once received. Due to your history of BV, please start tinidazole 2g (4 tabs) x 1 dose today, and again tomorrow. This medication is not on your formulary, please use GoodRx.com for a coupon. Please use the metronidazole vaginal gel preventatively around your menses. Please avoid all forms of intercourse until test results received, and if positive for any STI, all partners will need to complete entire course of antibiotics prior to resuming. As always, practice safer sexual practices by using protection each and every time, and limiting number of partners.

## 2022-03-06 LAB — CERVICOVAGINAL ANCILLARY ONLY
Bacterial Vaginitis (gardnerella): POSITIVE — AB
Candida Glabrata: NEGATIVE
Candida Vaginitis: NEGATIVE
Chlamydia: NEGATIVE
Comment: NEGATIVE
Comment: NEGATIVE
Comment: NEGATIVE
Comment: NEGATIVE
Comment: NEGATIVE
Comment: NORMAL
Neisseria Gonorrhea: NEGATIVE
Trichomonas: NEGATIVE

## 2022-03-08 NOTE — ED Provider Notes (Signed)
[facility]    CSN: [id number] Arrival date & time: 03/05/22  1058      History   Chief Complaint Chief Complaint  Patient presents with   Exposure to STD    Exposure to BV & have swollen lymph nodes - Entered by patient   bv    HPI Nicole Love is a 31 y.o. female.   Pt with recurrent BV, reports recurrence of same, similar symptoms as previous. Also requesting STI workup.   Vaginal Discharge Quality:  White Severity:  Mild Onset quality:  Gradual Timing:  Intermittent Progression:  Worsening Chronicity:  Recurrent Context: spontaneously (typically after menstrual cycle)   Relieved by:  Prescription medications Associated symptoms: no abdominal pain, no dyspareunia, no dysuria, no fever, no genital lesions, no nausea, no rash and no urinary frequency   Risk factors comment:  Long-standing history of recurrent BV  Past Medical History:  Diagnosis Date   Hyperthyroidism    Pregnancy     Patient Active Problem List   Diagnosis Date Noted   Post-dates pregnancy 10/14/2019   History of VBAC 08/03/2019   S/P tubal ligation 08/03/2019   Asymptomatic bacteriuria during pregnancy 03/28/2019   Supervision of other normal pregnancy, antepartum 03/23/2019   Thyroid disease affecting pregnancy    Previous cesarean delivery, antepartum 12/16/2017   Hyperthyroidism 10/22/2017    Past Surgical History:  Procedure Laterality Date   CESAREAN SECTION     TUBAL LIGATION N/A 05/17/2018   Procedure: POST PARTUM TUBAL LIGATION;  Surgeon: Chancy Milroy, MD;  Location: [facility];  Service: Gynecology;  Laterality: N/A;    OB History     Gravida  6   Para  5   Term  5   Preterm  0   AB  1   Living  5      SAB  1   IAB  0   Ectopic  0   Multiple      Live Births  5            Home Medications    Prior to Admission medications   Medication Sig Start Date End Date Taking? Authorizing Provider  AMBULATORY NON FORMULARY  MEDICATION 1 Device by Other route once a week. Blood Pressure Cuff Medium Monitored Regularly at home ICD 10:Z34.90 03/23/19   Donnamae Jude, MD  fluticasone [facility]) 50 MCG/ACT nasal spray Place 1 spray into both nostrils daily. 07/23/20   Montine Circle, PA-C  metroNIDAZOLE (METROGEL VAGINAL) 0.75 % vaginal gel Place 1 Applicatorful vaginally 2 (two) times daily. 03/05/22   Nigel Wessman, Loree Fee L, PA  Prenatal Vit-Fe Fumarate-FA (PRENATAL PO) Take by mouth.    [provider]  cetirizine (ZYRTEC ALLERGY) 10 MG tablet Take 1 tablet (10 mg total) by mouth daily. Patient not taking: Reported on 02/08/2021 06/16/20 03/06/21  Hall-Potvin, [place], PA-C  ferrous sulfate (FERROUSUL) 325 (65 FE) MG tablet Take 1 tablet (325 mg total) by mouth 2 (two) times daily. Patient not taking: Reported on 02/08/2021 08/05/19 03/06/21  Sloan Leiter, MD    Family History Family History  Problem Relation Age of Onset   Diabetes Maternal Grandmother     Social History Social History   Tobacco Use   Smoking status: Never   Smokeless tobacco: Never  Vaping Use   Vaping Use: Never used  Substance Use Topics   Alcohol use: No   Drug use: No     Allergies   Patient has no known  allergies.   Review of Systems Review of Systems  Constitutional:  Negative for fever.  Gastrointestinal:  Negative for abdominal pain and nausea.  Genitourinary:  Positive for vaginal discharge. Negative for dyspareunia and dysuria.    Physical Exam Triage Vital Signs ED Triage Vitals  Enc Vitals Group     BP 03/05/22 1139 (!) 141/73     Pulse Rate 03/05/22 1139 76     Resp 03/05/22 1139 18     Temp 03/05/22 1139 98.2 F (36.8 C)     Temp Source 03/05/22 1139 Oral     SpO2 03/05/22 1139 98 %     Weight --      Height --      Head Circumference --      Peak Flow --      Pain Score 03/05/22 1140 0     Pain Loc --      Pain Edu? --      Excl. in [place]? --    No data found.  Updated Vital Signs BP (!) 141/73  (BP Location: Right Arm)   Pulse 76   Temp 98.2 F (36.8 C) (Oral)   Resp 18   SpO2 98%   Visual Acuity Right Eye Distance:   Left Eye Distance:   Bilateral Distance:    Right Eye Near:   Left Eye Near:    Bilateral Near:     Physical Exam Vitals and nursing note reviewed.  Constitutional:      General: She is not in acute distress.    Appearance: Normal appearance. She is normal weight. She is not ill-appearing, toxic-appearing or diaphoretic.  HENT:     Head: Normocephalic and atraumatic.     Right Ear: External ear normal.     Left Ear: External ear normal.  Eyes:     General:        Right eye: No discharge.        Left eye: No discharge.     Extraocular Movements: Extraocular movements intact.     Conjunctiva/sclera: Conjunctivae normal.     Pupils: Pupils are equal, round, and reactive to light.  Cardiovascular:     Rate and Rhythm: Normal rate.  Pulmonary:     Effort: Pulmonary effort is normal. No respiratory distress.  Abdominal:     General: Abdomen is flat. Bowel sounds are normal. There is no distension.     Palpations: Abdomen is soft. There is no mass.     Tenderness: There is no abdominal tenderness. There is no right CVA tenderness, left CVA tenderness, guarding or rebound.     Hernia: No hernia is present.  Genitourinary:    Adnexa:        Right: No tenderness.         Left: No tenderness.       Comments: Internal exam deferred. No pelvic tenderness Musculoskeletal:     Cervical back: Normal range of motion.  Lymphadenopathy:     Cervical: No cervical adenopathy.  Neurological:     Mental Status: She is alert.     UC Treatments / Results  Labs (all labs ordered are listed, but only abnormal results are displayed) Labs Reviewed  POCT URINALYSIS DIP (MANUAL ENTRY) - Abnormal; Notable for the following components:      Result Value   Color, UA yellow (*)    Blood, UA trace-intact (*)    Leukocytes, UA Trace (*)    All other components  within normal limits  CERVICOVAGINAL  ANCILLARY ONLY    EKG   Radiology No results found.  Procedures Procedures (including critical care time)  Medications Ordered in UC Medications - No data to display  Initial Impression / Assessment and Plan / UC Course  I have reviewed the triage vital signs and the nursing notes.  Pertinent labs & imaging results that were available during my care of the patient were reviewed by me and considered in my medical decision making (see chart for details).     BV - pt with documented longstanding hx of recurrent BV. Sx are similar. Has only done flagyl in the past. Will do trial of tindamax instead. Will also try suppression prophylactically with metrogel prior to menses. Must follow up with gyne for refills if effective in prevention. Routine STI screening - completed via [facility] swab. Defers need for labs. No known exposure  Final Clinical Impressions(s) / UC Diagnoses   Final diagnoses:  Bacterial vaginosis  Routine screening for STI (sexually transmitted infection)     Discharge Instructions      You were tested today for gonorrhea, chlamydia, trichomonas, BV, and yeast. We will call you with the results of your test once received. Due to your history of BV, please start tinidazole 2g (4 tabs) x 1 dose today, and again tomorrow. This medication is not on your formulary, please use GoodRx.com for a coupon. Please use the metronidazole vaginal gel preventatively around your menses. Please avoid all forms of intercourse until test results received, and if positive for any STI, all partners will need to complete entire course of antibiotics prior to resuming. As always, practice safer sexual practices by using protection each and every time, and limiting number of partners.    ED Prescriptions     Medication Sig Dispense Auth. Provider   tinidazole (TINDAMAX) 500 MG tablet Take 4 tablets (2,000 mg total) by mouth daily with breakfast  for 2 days. 8 tablet Terease Marcotte L, PA   metroNIDAZOLE (METROGEL VAGINAL) 0.75 % vaginal gel  (Status: Discontinued) Place 1 Applicatorful vaginally 2 (two) times daily. 70 g Tymeshia Awan L, PA   metroNIDAZOLE (METROGEL VAGINAL) 0.75 % vaginal gel Place 1 Applicatorful vaginally 2 (two) times daily. 70 g Michaelle Bottomley L, [place]      PDMP not reviewed this encounter.   Chaney Malling, [place] 03/08/22 1206

## 2022-04-03 ENCOUNTER — Ambulatory Visit
Admission: RE | Admit: 2022-04-03 | Discharge: 2022-04-03 | Disposition: A | Discharge status: Home / Self Care | Payer: Medicaid Other | Source: Ambulatory Visit | Attending: Emergency Medicine | Admitting: Emergency Medicine

## 2022-04-03 ENCOUNTER — Ambulatory Visit (INDEPENDENT_AMBULATORY_CARE_PROVIDER_SITE_OTHER): Payer: Medicaid Other

## 2022-04-03 VITALS — BP 119/79 | HR 81 | Temp 98.0°F | Resp 18

## 2022-04-03 DIAGNOSIS — M7989 Other specified soft tissue disorders: Secondary | ICD-10-CM | POA: Diagnosis not present

## 2022-04-03 DIAGNOSIS — S93402A Sprain of unspecified ligament of left ankle, initial encounter: Secondary | ICD-10-CM | POA: Diagnosis not present

## 2022-04-03 DIAGNOSIS — S93602A Unspecified sprain of left foot, initial encounter: Secondary | ICD-10-CM | POA: Diagnosis not present

## 2022-04-03 DIAGNOSIS — S99912A Unspecified injury of left ankle, initial encounter: Secondary | ICD-10-CM | POA: Diagnosis not present

## 2022-04-03 DIAGNOSIS — M25572 Pain in left ankle and joints of left foot: Secondary | ICD-10-CM | POA: Diagnosis not present

## 2022-04-03 DIAGNOSIS — W57XXXA Bitten or stung by nonvenomous insect and other nonvenomous arthropods, initial encounter: Secondary | ICD-10-CM

## 2022-04-03 DIAGNOSIS — S00261A Insect bite (nonvenomous) of right eyelid and periocular area, initial encounter: Secondary | ICD-10-CM

## 2022-04-03 DIAGNOSIS — S99922A Unspecified injury of left foot, initial encounter: Secondary | ICD-10-CM | POA: Diagnosis not present

## 2022-04-03 MED ORDER — ERYTHROMYCIN 5 MG/GM OP OINT: TOPICAL_OINTMENT | OPHTHALMIC | 0 refills | Status: DC

## 2022-04-03 MED ORDER — IBUPROFEN 600 MG PO TABS: 600.0000 mg | ORAL_TABLET | Freq: Four times a day (QID) | ORAL | 0 refills | Status: AC | PRN
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
## 2022-04-03 NOTE — ED Provider Notes (Signed)
HPI  SUBJECTIVE:  Nicole Love is a 31 y.o. female who presents with left ankle and foot pain after catching her left foot in a cord yesterday.  She rolled her ankle outward, and felt a "pop".  She was able to weight-bear immediately after and has been able to do so since, but reports significant pain with it.  States that the pain is throbbing, constant, located in her midfoot, lateral ankle.  She tried Epsom salts soaks, ice, heating pad, and elastic bandage.  The ice and elastic bandage help.  Wearing shoes also helps.  Symptoms are worse with walking and active range of motion of the ankle.  She has no history of injury to the ankle or foot.  She also states that she was bitten by a bug on her right medial eyelid 3 days ago.  She reports itching, swelling of the upper eyelid.  She states that the swelling is improving, except in the medial corner.  No visual changes, photophobia.  She tried clear eyes drops with improvement of symptoms.  Symptoms are worse with palpation.  No history of MRSA.  LMP: June 22.  Denies possibility being pregnant.  PCP: [facility].  Sports medicine: Is establishing care in 2 weeks.    Past Medical History:  Diagnosis Date   Hyperthyroidism    Pregnancy     Past Surgical History:  Procedure Laterality Date   CESAREAN SECTION     TUBAL LIGATION N/A 05/17/2018   Procedure: POST PARTUM TUBAL LIGATION;  Surgeon: Chancy Milroy, MD;  Location: [facility];  Service: Gynecology;  Laterality: N/A;    Family History  Problem Relation Age of Onset   Diabetes Maternal Grandmother     Social History   Tobacco Use   Smoking status: Never   Smokeless tobacco: Never  Vaping Use   Vaping Use: Never used  Substance Use Topics   Alcohol use: No   Drug use: No    No current facility-administered medications for this encounter.  Current Outpatient Medications:    AMBULATORY NON FORMULARY MEDICATION, 1 Device by Other route once a week.  Blood Pressure Cuff Medium Monitored Regularly at home ICD 10:Z34.90, Disp: 1 kit, Rfl: 0   fluticasone (FLONASE) 50 MCG/ACT nasal spray, Place 1 spray into both nostrils daily., Disp: 16 g, Rfl: 0   metroNIDAZOLE (METROGEL VAGINAL) 0.75 % vaginal gel, Place 1 Applicatorful vaginally 2 (two) times daily., Disp: 70 g, Rfl: 0   Prenatal Vit-Fe Fumarate-FA (PRENATAL PO), Take by mouth., Disp: , Rfl:   No Known Allergies   ROS  As noted in HPI.   Physical Exam  BP 119/79 (BP Location: Left Arm)   Pulse 81   Temp 98 F (36.7 C) (Oral)   Resp 18   LMP 03/27/2022   SpO2 97%   Constitutional: Well developed, well nourished, no acute distress Eyes:  EOMI, conjunctiva normal bilaterally PERRLA.  No photophobia. Mildly tender swelling medial right upper eyelid.  No periorbital erythema, edema.  No incoming stye appreciated.  No pain with EOMs. HENT: Normocephalic, atraumatic,mucus membranes moist Respiratory: Normal inspiratory effort Cardiovascular: Normal rate GI: nondistended skin: No rash, skin intact Musculoskeletal: L Ankle: Proximal fibula NT , Distal fibula tender, Medial malleolus NT,  Deltoid ligaments medially NT ,  ATFL tender, calcaneofibular ligament  tender, posterior tablofibular ligament tender   Achilles NT, calcaneus NT,  Proximal 5th metatarsal tender, Midfoot tender, distal NVI with baseline sensation / motor to foot with DP  2+. Pain  with dorsiflexion/plantar flexion. Pain /  with inversion/eversion. - bruising. - squeeze test.  Ant drawer test stable . Pt able  to bear weight in dept.   Neurologic: Alert & oriented x 3, no focal neuro deficits Psychiatric: Speech and behavior appropriate   ED Course   Medications - No data to display  Orders Placed This Encounter  Procedures   DG Ankle Complete Left    Standing Status:   Standing    Number of Occurrences:   1    Order Specific Question:   Reason for Exam (SYMPTOM  OR DIAGNOSIS REQUIRED)    Answer:   Inversion  injury yesterday lateral malleolus tender, lateral ligaments tender.  Rule out fracture, effusion   DG Foot Complete Left    Standing Status:   Standing    Number of Occurrences:   1    Order Specific Question:   Reason for Exam (SYMPTOM  OR DIAGNOSIS REQUIRED)    Answer:   Inversion injury yesterday.  Midfoot, base of fifth metatarsal tender.  Rule out fracture.    No results found for this or any previous visit (from the past 24 hour(s)). DG Foot Complete Left  Result Date: 04/03/2022 CLINICAL DATA:  Trauma, pain EXAM: LEFT FOOT - COMPLETE 3+ VIEW COMPARISON:  None Available. FINDINGS: There is no evidence of fracture or dislocation. There is no evidence of arthropathy or other focal bone abnormality. Soft tissues are unremarkable. IMPRESSION: No fracture or dislocation is seen in the left foot. Electronically Signed   By: Elmer Picker M.D.   On: 04/03/2022 10:33   DG Ankle Complete Left  Result Date: 04/03/2022 CLINICAL DATA:  Trauma, pain EXAM: LEFT ANKLE COMPLETE - 3+ VIEW COMPARISON:  None Available. FINDINGS: No fracture or dislocation is seen. There is soft tissue swelling over the lateral malleolus. IMPRESSION: No fracture or dislocation is seen in the left ankle. Electronically Signed   By: Elmer Picker M.D.   On: 04/03/2022 10:33    ED Clinical Impression  1. Sprain of left ankle, unspecified ligament, initial encounter   2. Foot sprain, left, initial encounter   3. Insect bite of right eyelid, initial encounter      ED Assessment/Plan  1.  Left ankle and foot sprain.  Patient does not meet the [place] ankle rules.  Obtaining x-rays of ankle and foot due to bony tenderness.   Reviewed imaging independently.  No fracture, dislocation of foot or ankle.  See radiology report for full details.  Imaging negative for fracture.  Patient with a sprain of the foot and ankle.  Home with ASO, Tylenol/ibuprofen, ice.  Patient has an appointment with sports medicine in 2  weeks for her shoulder.  Follow-up with them if no better after 10 to 14 days.    2.  Insect bite medial upper right eyelid.  No evidence of periorbital cellulitis.  She has no history of MRSA.  Will send home with erythromycin ointment, warm compresses.  Discussed  imaging, MDM, treatment plan, and plan for follow-up with patient.  patient agrees with plan.   No orders of the defined types were placed in this encounter.     *This clinic note was created using Dragon dictation software. Therefore, there may be occasional mistakes despite careful proofreading.  ?    Melynda Ripple, MD 04/03/22 1049

## 2022-04-08 ENCOUNTER — Ambulatory Visit: Payer: Self-pay

## 2022-04-09 ENCOUNTER — Ambulatory Visit
Admission: RE | Admit: 2022-04-09 | Discharge: 2022-04-09 | Disposition: A | Discharge status: Home / Self Care | Payer: Medicaid Other | Source: Ambulatory Visit | Attending: Internal Medicine | Admitting: Internal Medicine

## 2022-04-09 VITALS — BP 124/80 | HR 75 | Temp 98.1°F | Resp 18 | Ht 67.0 in

## 2022-04-09 DIAGNOSIS — Z113 Encounter for screening for infections with a predominantly sexual mode of transmission: Secondary | ICD-10-CM | POA: Diagnosis not present

## 2022-04-09 DIAGNOSIS — L739 Follicular disorder, unspecified: Secondary | ICD-10-CM | POA: Diagnosis not present

## 2022-04-09 DIAGNOSIS — N898 Other specified noninflammatory disorders of vagina: Secondary | ICD-10-CM | POA: Insufficient documentation

## 2022-04-09 MED ORDER — CLINDAMYCIN HCL 150 MG PO CAPS: 450.0000 mg | ORAL_CAPSULE | Freq: Three times a day (TID) | ORAL | 0 refills | Status: AC

## 2022-04-09 NOTE — ED Triage Notes (Signed)
PT report bumps in groin area after shaved Has boil. Worse when sweating and form some pus Report pain, worse when touched by clothes Itch burning  Swap Deoderant on after shave Onset: 2 days ago   Request : swap for BB

## 2022-04-09 NOTE — ED Provider Notes (Signed)
[facility]    CSN: [id number] Arrival date & time: 04/09/22  0856      History   Chief Complaint Chief Complaint  Patient presents with   Rash    Shaved and now have a rash and pimple like bumps is groin area and bowel that's very irritated by clothing and sweat - Entered by patient    HPI Nicole Love is a 31 y.o. female.   Patient presents for multiple bumps in groin area that started after shaving about 2 days ago.  Patient reports that there is one that has been having some purulent drainage.  Denies fever, body aches, chills.  She also reports that she sweats excessively in groin area so symptoms may be attributed to this.  She is also concerned about bacterial vaginosis currently as she has recurrent bacterial vaginosis.  She reports that she always gets BV right before her menstrual cycle, which is about to start.  She has a slight change in her vaginal discharge but denies any associated vaginal odor, urinary symptoms, pelvic pain, abdominal pain, fever, back pain.  Denies any concern for STD or pregnancy.   Rash   Past Medical History:  Diagnosis Date   Hyperthyroidism    Pregnancy     Patient Active Problem List   Diagnosis Date Noted   Post-dates pregnancy 10/14/2019   History of VBAC 08/03/2019   S/P tubal ligation 08/03/2019   Asymptomatic bacteriuria during pregnancy 03/28/2019   Supervision of other normal pregnancy, antepartum 03/23/2019   Thyroid disease affecting pregnancy    Previous cesarean delivery, antepartum 12/16/2017   Hyperthyroidism 10/22/2017    Past Surgical History:  Procedure Laterality Date   CESAREAN SECTION     TUBAL LIGATION N/A 05/17/2018   Procedure: POST PARTUM TUBAL LIGATION;  Surgeon: Hermina Staggers, MD;  [facility]: [facility];  Service: Gynecology;  Laterality: N/A;    OB History     Gravida  6   Para  5   Term  5   Preterm  0   AB  1   Living  5      SAB  1   IAB  0   Ectopic  0    Multiple      Live Births  5            Home Medications    Prior to Admission medications   Medication Sig Start Date End Date Taking? Authorizing Provider  clindamycin (CLEOCIN) 150 MG capsule Take 3 capsules (450 mg total) by mouth 3 (three) times daily for 5 days. 04/09/22 04/14/22 Yes Caralina Nop, Acie Fredrickson, FNP  AMBULATORY NON FORMULARY MEDICATION 1 Device by Other route once a week. Blood Pressure Cuff Medium Monitored Regularly at home ICD 10:Z34.90 03/23/19   Reva Bores, MD  erythromycin ophthalmic ointment 1 cm ribbon to affected eyelid qid x 5 days 04/03/22   Domenick Gong, MD  fluticasone [facility]) 50 MCG/ACT nasal spray Place 1 spray into both nostrils daily. 07/23/20   Roxy Horseman, PA-C  ibuprofen (ADVIL) 600 MG tablet Take 1 tablet (600 mg total) by mouth every 6 (six) hours as needed. 04/03/22   Domenick Gong, MD  Prenatal Vit-Fe Fumarate-FA (PRENATAL PO) Take by mouth.    [provider]  cetirizine (ZYRTEC ALLERGY) 10 MG tablet Take 1 tablet (10 mg total) by mouth daily. Patient not taking: Reported on 02/08/2021 06/16/20 03/06/21  Hall-Potvin, [place], PA-C  ferrous sulfate (FERROUSUL) 325 (65 FE) MG tablet  Take 1 tablet (325 mg total) by mouth 2 (two) times daily. Patient not taking: Reported on 02/08/2021 08/05/19 03/06/21  Conan Bowens, MD    Family History Family History  Problem Relation Age of Onset   Diabetes Maternal Grandmother     Social History Social History   Tobacco Use   Smoking status: Never   Smokeless tobacco: Never  Vaping Use   Vaping Use: Never used  Substance Use Topics   Alcohol use: No   Drug use: No     Allergies   Patient has no known allergies.   Review of Systems Review of Systems Per HPI  Physical Exam Triage Vital Signs ED Triage Vitals  Enc Vitals Group     BP 04/09/22 0920 124/80     Pulse Rate 04/09/22 0920 75     Resp 04/09/22 0920 18     Temp 04/09/22 0920 98.1 F (36.7 C)     Temp Source  04/09/22 0920 Oral     SpO2 04/09/22 0920 98 %     Weight --      Height 04/09/22 0931 5\' 7"  (1.702 m)     Head Circumference --      Peak Flow --      Pain Score 04/09/22 0926 8     Pain Loc --      Pain Edu? --      Excl. in GC? --    No data found.  Updated Vital Signs BP 124/80 (BP Location: Left Arm)   Pulse 75   Temp 98.1 F (36.7 C) (Oral)   Resp 18   Ht 5\' 7"  (1.702 m)   LMP 03/27/2022   SpO2 98%   BMI 23.49 kg/m   Visual Acuity Right Eye Distance:   Left Eye Distance:   Bilateral Distance:    Right Eye Near:   Left Eye Near:    Bilateral Near:     Physical Exam Exam conducted with a chaperone present.  Constitutional:      General: She is not in acute distress.    Appearance: Normal appearance. She is not toxic-appearing or diaphoretic.  HENT:     Head: Normocephalic and atraumatic.  Eyes:     Extraocular Movements: Extraocular movements intact.     Conjunctiva/sclera: Conjunctivae normal.  Pulmonary:     Effort: Pulmonary effort is normal.  Genitourinary:      Comments: Multiple different infected hair follicles to mons pubis.  There is 1 that is slightly larger than the others at about 1 cm with surrounding induration to circled area on diagram.  No obvious purulent drainage noted. No vaginal discharge noted. Deferred pelvic exam with shared decision making. Self swab performed.  Neurological:     General: No focal deficit present.     Mental Status: She is alert and oriented to person, place, and time. Mental status is at baseline.  Psychiatric:        Mood and Affect: Mood normal.        Behavior: Behavior normal.        Thought Content: Thought content normal.        Judgment: Judgment normal.      UC Treatments / Results  Labs (all labs ordered are listed, but only abnormal results are displayed) Labs Reviewed  CERVICOVAGINAL ANCILLARY ONLY    EKG   Radiology No results found.  Procedures Procedures (including critical care  time)  Medications Ordered in UC Medications - No data to display  Initial Impression / Assessment and Plan / UC Course  I have reviewed the triage vital signs and the nursing notes.  Pertinent labs & imaging results that were available during my care of the patient were reviewed by me and considered in my medical decision making (see chart for details).     Patient has folliculitis on exam.  Will treat with clindamycin as this will cover for skin infection as well as recurrent bacterial vaginosis if present.  Cervicovaginal swab pending.  Will await results for any further treatment.  Also advised warm compresses to affected area on vaginal area.  Due to recurrent bacterial vaginosis, patient was advised to follow-up with gynecology at provided contact information as she has never seen a gynecologist for this.  Discussed return and ER precautions.  Patient verbalized understanding and was agreeable with plan. Final Clinical Impressions(s) / UC Diagnoses   Final diagnoses:  Folliculitis  Screening examination for venereal disease  Vaginal discharge     Discharge Instructions      You are being treated with an antibiotic for suspicion of bacterial vaginosis as well as skin infection of your vaginal area.  Please use warm compresses to vaginal area as well.  Recommend that you follow-up with gynecology at provided contact information for further evaluation and management as well given recurrent bacterial vaginosis.  Please refrain from sexual activity until test results and treatment are complete.  Your vaginal swab is pending.  Please take medication with food.    ED Prescriptions     Medication Sig Dispense Auth. Provider   clindamycin (CLEOCIN) 150 MG capsule Take 3 capsules (450 mg total) by mouth 3 (three) times daily for 5 days. 45 capsule Kress, Acie Fredrickson, [place]      PDMP not reviewed this encounter.   Gustavus Bryant, [place] 04/09/22 1002

## 2022-04-09 NOTE — Discharge Instructions (Signed)
You are being treated with an antibiotic for suspicion of bacterial vaginosis as well as skin infection of your vaginal area.  Please use warm compresses to vaginal area as well.  Recommend that you follow-up with gynecology at provided contact information for further evaluation and management as well given recurrent bacterial vaginosis.  Please refrain from sexual activity until test results and treatment are complete.  Your vaginal swab is pending.  Please take medication with food.

## 2022-04-10 LAB — CERVICOVAGINAL ANCILLARY ONLY
Bacterial Vaginitis (gardnerella): POSITIVE — AB
Candida Glabrata: NEGATIVE
Candida Vaginitis: NEGATIVE
Chlamydia: NEGATIVE
Comment: NEGATIVE
Comment: NEGATIVE
Comment: NEGATIVE
Comment: NEGATIVE
Comment: NEGATIVE
Comment: NORMAL
Neisseria Gonorrhea: NEGATIVE
Trichomonas: NEGATIVE

## 2022-06-04 ENCOUNTER — Ambulatory Visit: Payer: Self-pay

## 2022-06-05 ENCOUNTER — Ambulatory Visit: Payer: Self-pay

## 2022-06-06 ENCOUNTER — Ambulatory Visit
Admission: RE | Admit: 2022-06-06 | Discharge: 2022-06-06 | Disposition: A | Discharge status: Home / Self Care | Payer: Medicaid Other | Source: Ambulatory Visit | Attending: Physician Assistant | Admitting: Physician Assistant

## 2022-06-06 VITALS — BP 128/87 | HR 72 | Temp 97.9°F | Resp 16

## 2022-06-06 DIAGNOSIS — N898 Other specified noninflammatory disorders of vagina: Secondary | ICD-10-CM

## 2022-06-06 MED ORDER — FLUCONAZOLE 150 MG PO TABS: 150.0000 mg | ORAL_TABLET | Freq: Once | ORAL | 0 refills | Status: AC

## 2022-06-06 MED ORDER — METRONIDAZOLE 500 MG PO TABS
500.0000 mg | ORAL_TABLET | Freq: Two times a day (BID) | ORAL | 0 refills | Status: DC
Start: 2022-06-06 — End: 2023-09-10

## 2022-06-06 NOTE — ED Triage Notes (Signed)
Pt states white watery vaginal discharge for the past 2 days.

## 2022-06-06 NOTE — ED Provider Notes (Signed)
[facility]    CSN: [id number] Arrival date & time: 06/06/22  0901      History   Chief Complaint Chief Complaint  Patient presents with   Vaginal Discharge    Again someone called off and I had to take over . I will be here first thing in the morning when y'all open! - Entered by patient    HPI Nicole Love is a 31 y.o. female.   Patient here today for evaluation of white watery vaginal discharge that started 2 days ago.  She reports the symptoms are similar to prior BV infections.  She denies any other symptoms including abdominal pain, nausea or vomiting.  She has not had any known STD exposures.   The history is provided by the patient.  Vaginal Discharge Associated symptoms: no abdominal pain, no fever, no nausea and no vomiting     Past Medical History:  Diagnosis Date   Hyperthyroidism    Pregnancy     Patient Active Problem List   Diagnosis Date Noted   Post-dates pregnancy 10/14/2019   History of VBAC 08/03/2019   S/P tubal ligation 08/03/2019   Asymptomatic bacteriuria during pregnancy 03/28/2019   Supervision of other normal pregnancy, antepartum 03/23/2019   Thyroid disease affecting pregnancy    Previous cesarean delivery, antepartum 12/16/2017   Hyperthyroidism 10/22/2017    Past Surgical History:  Procedure Laterality Date   CESAREAN SECTION     TUBAL LIGATION N/A 05/17/2018   Procedure: POST PARTUM TUBAL LIGATION;  Surgeon: Hermina Staggers, MD;  [facility]: [facility];  Service: Gynecology;  Laterality: N/A;    OB History     Gravida  6   Para  5   Term  5   Preterm  0   AB  1   Living  5      SAB  1   IAB  0   Ectopic  0   Multiple      Live Births  5            Home Medications    Prior to Admission medications   Medication Sig Start Date End Date Taking? Authorizing Provider  fluconazole (DIFLUCAN) 150 MG tablet Take 1 tablet (150 mg total) by mouth once for 1 dose. 06/06/22 06/06/22 Yes Tomi Bamberger, PA-C  metroNIDAZOLE (FLAGYL) 500 MG tablet Take 1 tablet (500 mg total) by mouth 2 (two) times daily. 06/06/22  Yes Tomi Bamberger, PA-C  AMBULATORY NON FORMULARY MEDICATION 1 Device by Other route once a week. Blood Pressure Cuff Medium Monitored Regularly at home ICD 10:Z34.90 03/23/19   Reva Bores, MD  erythromycin ophthalmic ointment 1 cm ribbon to affected eyelid qid x 5 days 04/03/22   Domenick Gong, MD  fluticasone [facility]) 50 MCG/ACT nasal spray Place 1 spray into both nostrils daily. 07/23/20   Roxy Horseman, PA-C  ibuprofen (ADVIL) 600 MG tablet Take 1 tablet (600 mg total) by mouth every 6 (six) hours as needed. 04/03/22   Domenick Gong, MD  Prenatal Vit-Fe Fumarate-FA (PRENATAL PO) Take by mouth.    [provider]  cetirizine (ZYRTEC ALLERGY) 10 MG tablet Take 1 tablet (10 mg total) by mouth daily. Patient not taking: Reported on 02/08/2021 06/16/20 03/06/21  Hall-Potvin, [place], PA-C  ferrous sulfate (FERROUSUL) 325 (65 FE) MG tablet Take 1 tablet (325 mg total) by mouth 2 (two) times daily. Patient not taking: Reported on 02/08/2021 08/05/19 03/06/21  Conan Bowens, MD  Family History Family History  Problem Relation Age of Onset   Diabetes Maternal Grandmother     Social History Social History   Tobacco Use   Smoking status: Never   Smokeless tobacco: Never  Vaping Use   Vaping Use: Never used  Substance Use Topics   Alcohol use: No   Drug use: No     Allergies   Patient has no known allergies.   Review of Systems Review of Systems  Constitutional:  Negative for chills and fever.  Eyes:  Negative for discharge and redness.  Gastrointestinal:  Negative for abdominal pain, nausea and vomiting.  Genitourinary:  Positive for vaginal discharge. Negative for vaginal bleeding.     Physical Exam Triage Vital Signs ED Triage Vitals  Enc Vitals Group     BP 06/06/22 0913 128/87     Pulse Rate 06/06/22 0913 72     Resp 06/06/22  0913 16     Temp 06/06/22 0913 97.9 F (36.6 C)     Temp Source 06/06/22 0913 Oral     SpO2 06/06/22 0913 97 %     Weight --      Height --      Head Circumference --      Peak Flow --      Pain Score 06/06/22 0917 0     Pain Loc --      Pain Edu? --      Excl. in GC? --    No data found.  Updated Vital Signs BP 128/87 (BP Location: Left Arm)   Pulse 72   Temp 97.9 F (36.6 C) (Oral)   Resp 16   LMP 05/27/2022 (Exact Date)   SpO2 97%      Physical Exam Vitals and nursing note reviewed.  Constitutional:      General: She is not in acute distress.    Appearance: Normal appearance. She is not ill-appearing.  HENT:     Head: Normocephalic and atraumatic.  Eyes:     Conjunctiva/sclera: Conjunctivae normal.  Cardiovascular:     Rate and Rhythm: Normal rate.  Pulmonary:     Effort: Pulmonary effort is normal.  Neurological:     Mental Status: She is alert.  Psychiatric:        Mood and Affect: Mood normal.        Behavior: Behavior normal.        Thought Content: Thought content normal.      UC Treatments / Results  Labs (all labs ordered are listed, but only abnormal results are displayed) Labs Reviewed  CERVICOVAGINAL ANCILLARY ONLY    EKG   Radiology No results found.  Procedures Procedures (including critical care time)  Medications Ordered in UC Medications - No data to display  Initial Impression / Assessment and Plan / UC Course  I have reviewed the triage vital signs and the nursing notes.  Pertinent labs & imaging results that were available during my care of the patient were reviewed by me and considered in my medical decision making (see chart for details).    We will treat with metronidazole given history of BV and Diflucan prescribed at patient request given history of yeast infections after treatment with metronidazole.  Encouraged follow-up if symptoms do not improve or worsen.  Will await results for further recommendation.  Final  Clinical Impressions(s) / UC Diagnoses   Final diagnoses:  Vaginal discharge   Discharge Instructions   None    ED Prescriptions  Medication Sig Dispense Auth. Provider   metroNIDAZOLE (FLAGYL) 500 MG tablet Take 1 tablet (500 mg total) by mouth 2 (two) times daily. 14 tablet Erma Pinto F, PA-C   fluconazole (DIFLUCAN) 150 MG tablet Take 1 tablet (150 mg total) by mouth once for 1 dose. 1 tablet Tomi Bamberger, PA-C      PDMP not reviewed this encounter.   Tomi Bamberger, PA-C 06/06/22 1030

## 2022-06-10 ENCOUNTER — Telehealth: Payer: Self-pay | Admitting: Emergency Medicine

## 2022-06-10 LAB — CERVICOVAGINAL ANCILLARY ONLY
Bacterial Vaginitis (gardnerella): POSITIVE — AB
Candida Glabrata: NEGATIVE
Candida Vaginitis: NEGATIVE
Chlamydia: NEGATIVE
Comment: NEGATIVE
Comment: NEGATIVE
Comment: NEGATIVE
Comment: NEGATIVE
Comment: NEGATIVE
Comment: NORMAL
Neisseria Gonorrhea: NEGATIVE
Trichomonas: POSITIVE — AB

## 2022-08-13 ENCOUNTER — Ambulatory Visit: Payer: Self-pay

## 2022-09-12 ENCOUNTER — Ambulatory Visit: Payer: Self-pay

## 2022-09-15 ENCOUNTER — Ambulatory Visit
Admission: RE | Admit: 2022-09-15 | Discharge: 2022-09-15 | Disposition: A | Discharge status: Home / Self Care | Payer: Medicaid Other | Source: Ambulatory Visit | Attending: Internal Medicine | Admitting: Internal Medicine

## 2022-09-15 VITALS — BP 118/83 | HR 88 | Temp 97.9°F | Resp 18

## 2022-09-15 DIAGNOSIS — R21 Rash and other nonspecific skin eruption: Secondary | ICD-10-CM | POA: Diagnosis not present

## 2022-09-15 DIAGNOSIS — H9391 Unspecified disorder of right ear: Secondary | ICD-10-CM | POA: Diagnosis not present

## 2022-09-15 DIAGNOSIS — H6591 Unspecified nonsuppurative otitis media, right ear: Secondary | ICD-10-CM

## 2022-09-15 DIAGNOSIS — R238 Other skin changes: Secondary | ICD-10-CM

## 2022-09-15 MED ORDER — KETOCONAZOLE 2 % EX SHAM: 1.0000 | MEDICATED_SHAMPOO | CUTANEOUS | 0 refills | Status: AC

## 2022-09-15 MED ORDER — FLUTICASONE PROPIONATE 50 MCG/ACT NA SUSP: 1.0000 | Freq: Every day | NASAL | 0 refills | Status: AC

## 2022-09-15 MED ORDER — CETIRIZINE HCL 10 MG PO TABS: 10.0000 mg | ORAL_TABLET | Freq: Every day | ORAL | 0 refills | Status: AC

## 2022-09-15 MED ORDER — DOXYCYCLINE HYCLATE 100 MG PO CAPS: 100.0000 mg | ORAL_CAPSULE | Freq: Two times a day (BID) | ORAL | 0 refills | Status: AC

## 2022-09-15 NOTE — ED Triage Notes (Addendum)
Pt is present today with c/o right ear drainage x4 days ago   Pt states that she also rash on her hair line and bumps under her left armpit x3 days

## 2022-09-15 NOTE — Discharge Instructions (Signed)
You have fluid behind your eardrum which is being treated with cetirizine and Flonase.    You do have a bump on the ear which is being treated with an antibiotic that you take by mouth.  You have several infected hair follicles and areas of the skin so this can be treated with the same antibiotic used for your ear.  Take this medication with food to avoid nausea.  You have inflammation to your scalp which is being treated with the shampoo that has been sent to the pharmacy as well.  Please follow-up with dermatology for scalp and skin issues.

## 2022-09-15 NOTE — ED Provider Notes (Signed)
[facility]    CSN: [id number] Arrival date & time: 09/15/22  1008      History   Chief Complaint Chief Complaint  Patient presents with   Ear Drainage    Leaking clearish yellow fluids. Burning sensation then drains . Bump inside ear - Entered by patient   Rash    HPI Nicole Love is a 31 y.o. female.   Patient presents with several different chief complaints today.  Patient reports that she feels like she has some right ear drainage for about 4 days.  She is also concerned about a possible bump to the right ear as well that has been present for around the same time.  Denies any current associated upper respiratory symptoms, cough, fever.  Denies trauma or foreign body to the ear.  Patient also has rash that is itchy present to bilateral armpits, center of chest, underneath breasts that has been present for about 3 days.  She denies any changes to lotions, soaps, detergents, foods, etc.  She started using a new razor due to the bumps but reports that it has gotten worse.  She also reports that she has an itchy rash to her scalp as well that started around the same time.  Denies any changes to shampoos.  Denies any associated fever.  Denies history of any similar rashes.   Ear Drainage  Rash   Past Medical History:  Diagnosis Date   Hyperthyroidism    Pregnancy     Patient Active Problem List   Diagnosis Date Noted   Post-dates pregnancy 10/14/2019   History of VBAC 08/03/2019   S/P tubal ligation 08/03/2019   Asymptomatic bacteriuria during pregnancy 03/28/2019   Supervision of other normal pregnancy, antepartum 03/23/2019   Thyroid disease affecting pregnancy    Previous cesarean delivery, antepartum 12/16/2017   Hyperthyroidism 10/22/2017    Past Surgical History:  Procedure Laterality Date   CESAREAN SECTION     TUBAL LIGATION N/A 05/17/2018   Procedure: POST PARTUM TUBAL LIGATION;  Surgeon: Hermina StaggersErvin, Michael L, MD;  [facility]: [facility];   Service: Gynecology;  Laterality: N/A;    OB History     Gravida  6   Para  5   Term  5   Preterm  0   AB  1   Living  5      SAB  1   IAB  0   Ectopic  0   Multiple      Live Births  5            Home Medications    Prior to Admission medications   Medication Sig Start Date End Date Taking? Authorizing Provider  cetirizine (ZYRTEC) 10 MG tablet Take 1 tablet (10 mg total) by mouth daily. 09/15/22  Yes Rad Gramling, Rolly SalterHaley E, FNP  doxycycline (VIBRAMYCIN) 100 MG capsule Take 1 capsule (100 mg total) by mouth 2 (two) times daily. 09/15/22  Yes Thi Klich, Rolly SalterHaley E, FNP  fluticasone (FLONASE) 50 MCG/ACT nasal spray Place 1 spray into both nostrils daily. 09/15/22  Yes Alvia Jablonski, Rolly SalterHaley E, FNP  ketoconazole (NIZORAL) 2 % shampoo Apply 1 Application topically 2 (two) times a week. Apply 5 to 10 mL to wet scalp, lather, leave on 3 to 5 minutes, and rinse 09/15/22  Yes Dawsen Krieger, HollandaleHaley E, FNP  AMBULATORY NON FORMULARY MEDICATION 1 Device by Other route once a week. Blood Pressure Cuff Medium Monitored Regularly at home ICD 10:Z34.90 03/23/19   Reva BoresPratt, Tanya S, MD  erythromycin ophthalmic ointment 1 cm ribbon to affected eyelid qid x 5 days 04/03/22   Domenick Gong, MD  ibuprofen (ADVIL) 600 MG tablet Take 1 tablet (600 mg total) by mouth every 6 (six) hours as needed. 04/03/22   Domenick Gong, MD  metroNIDAZOLE (FLAGYL) 500 MG tablet Take 1 tablet (500 mg total) by mouth 2 (two) times daily. 06/06/22   Tomi Bamberger, PA-C  Prenatal Vit-Fe Fumarate-FA (PRENATAL PO) Take by mouth.    [provider]  ferrous sulfate (FERROUSUL) 325 (65 FE) MG tablet Take 1 tablet (325 mg total) by mouth 2 (two) times daily. Patient not taking: Reported on 02/08/2021 08/05/19 03/06/21  Conan Bowens, MD    Family History Family History  Problem Relation Age of Onset   Diabetes Maternal Grandmother     Social History Social History   Tobacco Use   Smoking status: Never   Smokeless tobacco:  Never  Vaping Use   Vaping Use: Never used  Substance Use Topics   Alcohol use: No   Drug use: No     Allergies   Patient has no known allergies.   Review of Systems Review of Systems Per HPI  Physical Exam Triage Vital Signs ED Triage Vitals  Enc Vitals Group     BP 09/15/22 1026 118/83     Pulse Rate 09/15/22 1026 88     Resp 09/15/22 1026 18     Temp 09/15/22 1026 97.9 F (36.6 C)     Temp src --      SpO2 09/15/22 1026 98 %     Weight --      Height --      Head Circumference --      Peak Flow --      Pain Score 09/15/22 1025 3     Pain Loc --      Pain Edu? --      Excl. in GC? --    No data found.  Updated Vital Signs BP 118/83   Pulse 88   Temp 97.9 F (36.6 C)   Resp 18   SpO2 98%   Visual Acuity Right Eye Distance:   Left Eye Distance:   Bilateral Distance:    Right Eye Near:   Left Eye Near:    Bilateral Near:     Physical Exam Constitutional:      General: She is not in acute distress.    Appearance: Normal appearance. She is not toxic-appearing or diaphoretic.  HENT:     Head: Normocephalic and atraumatic.     Ears:     Comments: Patient has slightly raised bump/lesion present to posterior right tragus.  No drainage noted.  No erythema noted.  Right external canal and remainder of external ear appears normal.  TM is intact with no discoloration but there does appear to be some mild fluid behind right TM. Eyes:     Extraocular Movements: Extraocular movements intact.     Conjunctiva/sclera: Conjunctivae normal.  Pulmonary:     Effort: Pulmonary effort is normal.  Skin:    Comments: Patient has scattered singular, maculopapular slightly raised lesions present throughout bilateral axilla.  Similar lesions present to center of chest directly between breasts as well as right upper abdomen directly beneath breasts. Patient has bra on entire time of physical exam.   Patient has a scaly rash scattered throughout scalp and hairline.  There  is some crustiness present scattered throughout certain areas of the rash as well.  No obvious purulent drainage noted.  Neurological:     General: No focal deficit present.     Mental Status: She is alert and oriented to person, place, and time. Mental status is at baseline.  Psychiatric:        Mood and Affect: Mood normal.        Behavior: Behavior normal.        Thought Content: Thought content normal.        Judgment: Judgment normal.      UC Treatments / Results  Labs (all labs ordered are listed, but only abnormal results are displayed) Labs Reviewed - No data to display  EKG   Radiology No results found.  Procedures Procedures (including critical care time)  Medications Ordered in UC Medications - No data to display  Initial Impression / Assessment and Plan / UC Course  I have reviewed the triage vital signs and the nursing notes.  Pertinent labs & imaging results that were available during my care of the patient were reviewed by me and considered in my medical decision making (see chart for details).     Patient has bump to external ear as well as fluid behind TM of right ear.  Remainder of external ear and external canal are mormal.  No obvious otitis media noted.  Will treat fluid behind TM with antihistamine and Flonase.  Bump to external ear is being treated with doxycycline as patient is taking this for other skin issues at this time so this should be helpful with this.  Patient has several different infected hair follicles and small abscesses throughout axilla bilaterally as well as surrounding breasts which is concerning for possible hidradenitis suppurativa.  Will treat with doxycycline.  Patient advised to take this medication with food.  Scalp irritation differential diagnoses include seborrheic dermatitis versus tinea infection.  Will treat with ketoconazole shampoo.  Patient was advised to follow-up with provided contact information for dermatology for  all skin complaints today.  Patient encouraged to follow-up if any symptoms persist or worsen.  She was given strict return precautions.  Patient verbalized understanding and was agreeable with plan. Final Clinical Impressions(s) / UC Diagnoses   Final diagnoses:  Fluid level behind tympanic membrane of right ear  Lesion of right ear  Scalp irritation  Rash and nonspecific skin eruption     Discharge Instructions      You have fluid behind your eardrum which is being treated with cetirizine and Flonase.    You do have a bump on the ear which is being treated with an antibiotic that you take by mouth.  You have several infected hair follicles and areas of the skin so this can be treated with the same antibiotic used for your ear.  Take this medication with food to avoid nausea.  You have inflammation to your scalp which is being treated with the shampoo that has been sent to the pharmacy as well.  Please follow-up with dermatology for scalp and skin issues.      ED Prescriptions     Medication Sig Dispense Auth. Provider   doxycycline (VIBRAMYCIN) 100 MG capsule Take 1 capsule (100 mg total) by mouth 2 (two) times daily. 20 capsule [place], [place] E, [place]   cetirizine (ZYRTEC) 10 MG tablet Take 1 tablet (10 mg total) by mouth daily. 30 tablet [place], [place] E, [place]   fluticasone [facility]) 50 MCG/ACT nasal spray Place 1 spray into both nostrils daily. 16 g Gustavus Bryant, [place]  ketoconazole (NIZORAL) 2 % shampoo Apply 1 Application topically 2 (two) times a week. Apply 5 to 10 mL to wet scalp, lather, leave on 3 to 5 minutes, and rinse 120 mL Gustavus Bryant, [place]      PDMP not reviewed this encounter.   Gustavus Bryant, [place] 09/15/22 1209

## 2023-04-20 ENCOUNTER — Inpatient Hospital Stay: Admission: RE | Admit: 2023-04-20 | Payer: Self-pay | Source: Ambulatory Visit

## 2023-04-21 ENCOUNTER — Ambulatory Visit (INDEPENDENT_AMBULATORY_CARE_PROVIDER_SITE_OTHER): Payer: Medicaid Other

## 2023-04-21 ENCOUNTER — Ambulatory Visit
Admission: EM | Admit: 2023-04-21 | Discharge: 2023-04-21 | Disposition: A | Discharge status: Home / Self Care | Payer: Medicaid Other | Attending: Family Medicine | Admitting: Family Medicine

## 2023-04-21 DIAGNOSIS — S62339A Displaced fracture of neck of unspecified metacarpal bone, initial encounter for closed fracture: Secondary | ICD-10-CM

## 2023-04-21 DIAGNOSIS — S62336A Displaced fracture of neck of fifth metacarpal bone, right hand, initial encounter for closed fracture: Secondary | ICD-10-CM

## 2023-04-21 DIAGNOSIS — S62392A Other fracture of third metacarpal bone, right hand, initial encounter for closed fracture: Secondary | ICD-10-CM | POA: Diagnosis not present

## 2023-04-21 MED ORDER — CYCLOBENZAPRINE HCL 5 MG PO TABS: 5.0000 mg | ORAL_TABLET | Freq: Three times a day (TID) | ORAL | 0 refills | Status: AC | PRN

## 2023-04-21 MED ORDER — NAPROXEN 375 MG PO TABS: 375.0000 mg | ORAL_TABLET | Freq: Two times a day (BID) | ORAL | 0 refills | Status: AC
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
## 2023-04-21 NOTE — Discharge Instructions (Addendum)
Start naproxen 375 mg twice daily with food for 7 days this will reduce swelling and pain related to injury.  For acute pain you may take 1 tablet of Flexeril every 8 hours as needed for pain.  Flexeril can cause severe drowsiness therefore only take if you are not operating a motor vehicle or working. As discussed you will need to follow-up with orthopedic specialist at [facility] to assume care and treatment of fracture.  Contact their office today to schedule a evaluation of your boxer fracture involving the right hand.  The orthopedic provider will handle write you out of work or applied any restrictions related to your injury after this current work note expires.
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
## 2023-04-30 DIAGNOSIS — M79641 Pain in right hand: Secondary | ICD-10-CM | POA: Insufficient documentation

## 2023-04-30 DIAGNOSIS — S62336A Displaced fracture of neck of fifth metacarpal bone, right hand, initial encounter for closed fracture: Secondary | ICD-10-CM | POA: Diagnosis not present

## 2023-05-19 DIAGNOSIS — M79641 Pain in right hand: Secondary | ICD-10-CM | POA: Diagnosis not present

## 2023-06-16 DIAGNOSIS — S62366D Nondisplaced fracture of neck of fifth metacarpal bone, right hand, subsequent encounter for fracture with routine healing: Secondary | ICD-10-CM | POA: Diagnosis not present

## 2023-06-16 DIAGNOSIS — S62309A Unspecified fracture of unspecified metacarpal bone, initial encounter for closed fracture: Secondary | ICD-10-CM | POA: Insufficient documentation

## 2023-07-02 ENCOUNTER — Ambulatory Visit: Payer: Self-pay

## 2023-07-09 ENCOUNTER — Ambulatory Visit: Payer: Self-pay

## 2023-09-10 ENCOUNTER — Telehealth: Payer: Medicaid Other | Admitting: Physician Assistant

## 2023-09-10 DIAGNOSIS — J069 Acute upper respiratory infection, unspecified: Secondary | ICD-10-CM

## 2023-09-10 DIAGNOSIS — R110 Nausea: Secondary | ICD-10-CM | POA: Diagnosis not present

## 2023-09-10 MED ORDER — PSEUDOEPH-BROMPHEN-DM 30-2-10 MG/5ML PO SYRP: 5.0000 mL | ORAL_SOLUTION | Freq: Four times a day (QID) | ORAL | 0 refills | Status: AC | PRN

## 2023-09-10 MED ORDER — BENZONATATE 100 MG PO CAPS: 100.0000 mg | ORAL_CAPSULE | Freq: Three times a day (TID) | ORAL | 0 refills | Status: AC | PRN

## 2023-09-10 MED ORDER — ONDANSETRON HCL 4 MG PO TABS: 4.0000 mg | ORAL_TABLET | Freq: Three times a day (TID) | ORAL | 0 refills | Status: AC | PRN

## 2023-09-10 NOTE — Progress Notes (Signed)
Virtual Visit Consent   Nicole Love, you are scheduled for a virtual visit with a [facility] provider today. Just as with appointments in the office, your consent must be obtained to participate. Your consent will be active for this visit and any virtual visit you may have with one of our providers in the next 365 days. If you have a MyChart account, a copy of this consent can be sent to you electronically.  As this is a virtual visit, video technology does not allow for your provider to perform a traditional examination. This may limit your provider's ability to fully assess your condition. If your provider identifies any concerns that need to be evaluated in person or the need to arrange testing (such as labs, EKG, etc.), we will make arrangements to do so. Although advances in technology are sophisticated, we cannot ensure that it will always work on either your end or our end. If the connection with a video visit is poor, the visit may have to be switched to a telephone visit. With either a video or telephone visit, we are not always able to ensure that we have a secure connection.  By engaging in this virtual visit, you consent to the provision of healthcare and authorize for your insurance to be billed (if applicable) for the services provided during this visit. Depending on your insurance coverage, you may receive a charge related to this service.  I need to obtain your verbal consent now. Are you willing to proceed with your visit today? Zaryiah Bendure Gundrum has provided verbal consent on 09/10/2023 for a virtual visit (video or telephone). Margaretann Loveless, PA-C  Date: 09/10/2023 8:56 AM  Virtual Visit via Video Note   I, Margaretann Loveless, connected with  Nicole Love  ([id number], Apr 11, 1991) on 09/10/23 at  8:45 AM EST by a video-enabled telemedicine application and verified that I am speaking with the correct person using two identifiers.  Location: Patient: Virtual Visit Location  Patient: Other: work; isolated Provider: Engineer, mining Provider: Home Office   I discussed the limitations of evaluation and management by telemedicine and the availability of in person appointments. The patient expressed understanding and agreed to proceed.    History of Present Illness: Nicole Love is a 32 y.o. who identifies as a female who was assigned female at birth, and is being seen today for URI symptoms.  HPI: URI  This is a new problem. The current episode started yesterday. The problem has been gradually worsening. There has been no fever. Associated symptoms include congestion, coughing, headaches, nausea, a plugged ear sensation (feels ringing), rhinorrhea, sinus pain and a sore throat. Pertinent negatives include no abdominal pain, diarrhea, ear pain or vomiting. Associated symptoms comments: Watery eyes. Treatments tried: dayquil. The treatment provided no relief.     Problems:  Patient Active Problem List   Diagnosis Date Noted   Post-dates pregnancy 10/14/2019   History of VBAC 08/03/2019   S/P tubal ligation 08/03/2019   Asymptomatic bacteriuria during pregnancy 03/28/2019   Supervision of other normal pregnancy, antepartum 03/23/2019   Thyroid disease affecting pregnancy    Previous cesarean delivery, antepartum 12/16/2017   Hyperthyroidism 10/22/2017    Allergies: No Known Allergies Medications:  Current Outpatient Medications:    benzonatate (TESSALON) 100 MG capsule, Take 1-2 capsules (100-200 mg total) by mouth 3 (three) times daily as needed., Disp: 30 capsule, Rfl: 0   brompheniramine-pseudoephedrine-DM 30-2-10 MG/5ML syrup, Take 5 mLs by mouth 4 (four) times  daily as needed., Disp: 120 mL, Rfl: 0   cetirizine (ZYRTEC) 10 MG tablet, Take 1 tablet (10 mg total) by mouth daily., Disp: 30 tablet, Rfl: 0   cyclobenzaprine (FLEXERIL) 5 MG tablet, Take 1 tablet (5 mg total) by mouth 3 (three) times daily as needed for muscle spasms., Disp: 20 tablet,  Rfl: 0   doxycycline (VIBRAMYCIN) 100 MG capsule, Take 1 capsule (100 mg total) by mouth 2 (two) times daily., Disp: 20 capsule, Rfl: 0   erythromycin ophthalmic ointment, 1 cm ribbon to affected eyelid qid x 5 days, Disp: 3.5 g, Rfl: 0   fluticasone (FLONASE) 50 MCG/ACT nasal spray, Place 1 spray into both nostrils daily., Disp: 16 g, Rfl: 0   ibuprofen (ADVIL) 600 MG tablet, Take 1 tablet (600 mg total) by mouth every 6 (six) hours as needed., Disp: 30 tablet, Rfl: 0   ketoconazole (NIZORAL) 2 % shampoo, Apply 1 Application topically 2 (two) times a week. Apply 5 to 10 mL to wet scalp, lather, leave on 3 to 5 minutes, and rinse, Disp: 120 mL, Rfl: 0   naproxen (NAPROSYN) 375 MG tablet, Take 1 tablet (375 mg total) by mouth 2 (two) times daily., Disp: 20 tablet, Rfl: 0   ondansetron (ZOFRAN) 4 MG tablet, Take 1 tablet (4 mg total) by mouth every 8 (eight) hours as needed for nausea or vomiting., Disp: 20 tablet, Rfl: 0  Observations/Objective: Patient is well-developed, well-nourished in no acute distress.  Resting comfortably  Head is normocephalic, atraumatic.  No labored breathing.  Speech is clear and coherent with logical content.  Patient is alert and oriented at baseline.    Assessment and Plan: 1. Viral URI with cough - brompheniramine-pseudoephedrine-DM 30-2-10 MG/5ML syrup; Take 5 mLs by mouth 4 (four) times daily as needed.  Dispense: 120 mL; Refill: 0 - benzonatate (TESSALON) 100 MG capsule; Take 1-2 capsules (100-200 mg total) by mouth 3 (three) times daily as needed.  Dispense: 30 capsule; Refill: 0  2. Nausea - ondansetron (ZOFRAN) 4 MG tablet; Take 1 tablet (4 mg total) by mouth every 8 (eight) hours as needed for nausea or vomiting.  Dispense: 20 tablet; Refill: 0  - Suspect viral URI - Symptomatic medications of choice over the counter as needed - Bromfed DM and Tessalon prescribed for cough - Zofran for nausea - Push fluids - Rest - Seek further evaluation if  symptoms change or worsen   Follow Up Instructions: I discussed the assessment and treatment plan with the patient. The patient was provided an opportunity to ask questions and all were answered. The patient agreed with the plan and demonstrated an understanding of the instructions.  A copy of instructions were sent to the patient via MyChart unless otherwise noted below.    The patient was advised to call back or seek an in-person evaluation if the symptoms worsen or if the condition fails to improve as anticipated.    Margaretann Loveless, PA-C

## 2023-09-10 NOTE — Patient Instructions (Signed)
Carola Rhine, thank you for joining Margaretann Loveless, PA-C for today's virtual visit.  While this provider is not your primary care provider (PCP), if your PCP is located in our provider database this encounter information will be shared with them immediately following your visit.   A [facility] MyChart account gives you access to today's visit and all your visits, tests, and labs performed at [facility] " click here if you don't have a [facility] MyChart account or go to [email or web address]  Consent: (Patient) Nicole Love provided verbal consent for this virtual visit at the beginning of the encounter.  Current Medications:  Current Outpatient Medications:    benzonatate (TESSALON) 100 MG capsule, Take 1-2 capsules (100-200 mg total) by mouth 3 (three) times daily as needed., Disp: 30 capsule, Rfl: 0   brompheniramine-pseudoephedrine-DM 30-2-10 MG/5ML syrup, Take 5 mLs by mouth 4 (four) times daily as needed., Disp: 120 mL, Rfl: 0   ondansetron (ZOFRAN) 4 MG tablet, Take 1 tablet (4 mg total) by mouth every 8 (eight) hours as needed for nausea or vomiting., Disp: 20 tablet, Rfl: 0   cetirizine (ZYRTEC) 10 MG tablet, Take 1 tablet (10 mg total) by mouth daily., Disp: 30 tablet, Rfl: 0   cyclobenzaprine (FLEXERIL) 5 MG tablet, Take 1 tablet (5 mg total) by mouth 3 (three) times daily as needed for muscle spasms., Disp: 20 tablet, Rfl: 0   doxycycline (VIBRAMYCIN) 100 MG capsule, Take 1 capsule (100 mg total) by mouth 2 (two) times daily., Disp: 20 capsule, Rfl: 0   erythromycin ophthalmic ointment, 1 cm ribbon to affected eyelid qid x 5 days, Disp: 3.5 g, Rfl: 0   fluticasone (FLONASE) 50 MCG/ACT nasal spray, Place 1 spray into both nostrils daily., Disp: 16 g, Rfl: 0   ibuprofen (ADVIL) 600 MG tablet, Take 1 tablet (600 mg total) by mouth every 6 (six) hours as needed., Disp: 30 tablet, Rfl: 0   ketoconazole (NIZORAL) 2 % shampoo, Apply 1 Application topically 2  (two) times a week. Apply 5 to 10 mL to wet scalp, lather, leave on 3 to 5 minutes, and rinse, Disp: 120 mL, Rfl: 0   naproxen (NAPROSYN) 375 MG tablet, Take 1 tablet (375 mg total) by mouth 2 (two) times daily., Disp: 20 tablet, Rfl: 0   Medications ordered in this encounter:  Meds ordered this encounter  Medications   brompheniramine-pseudoephedrine-DM 30-2-10 MG/5ML syrup    Sig: Take 5 mLs by mouth 4 (four) times daily as needed.    Dispense:  120 mL    Refill:  0    Order Specific Question:   Supervising Provider    Answer:   Merrilee Jansky [[id number]]   benzonatate (TESSALON) 100 MG capsule    Sig: Take 1-2 capsules (100-200 mg total) by mouth 3 (three) times daily as needed.    Dispense:  30 capsule    Refill:  0    Order Specific Question:   Supervising Provider    Answer:   LAMPTEY, PHILIP O [[id number]]   ondansetron (ZOFRAN) 4 MG tablet    Sig: Take 1 tablet (4 mg total) by mouth every 8 (eight) hours as needed for nausea or vomiting.    Dispense:  20 tablet    Refill:  0    Order Specific Question:   Supervising Provider    Answer:   Merrilee Jansky [id number]     *If you need refills on other medications prior to your next appointment, please  contact your pharmacy*  Follow-Up: Call back or seek an in-person evaluation if the symptoms worsen or if the condition fails to improve as anticipated.   Virtual Care [phone]  Other Instructions Viral Respiratory Infection A respiratory infection is an illness that affects part of the respiratory system, such as the lungs, nose, or throat. A respiratory infection that is caused by a virus is called a viral respiratory infection. Common types of viral respiratory infections include: A cold. The flu (influenza). A respiratory syncytial virus (RSV) infection. What are the causes? This condition is caused by a virus. The virus may spread through contact with droplets or direct contact with infected people or  their mucus or secretions. The virus may spread from person to person (is contagious). What are the signs or symptoms? Symptoms of this condition include: A stuffy or runny nose. A sore throat or cough. Shortness of breath or difficulty breathing. Yellow or green mucus (sputum). Other symptoms may include: A fever. Sweating or chills. Fatigue. Achy muscles. A headache. How is this diagnosed? This condition may be diagnosed based on: Your symptoms. A physical exam. Testing of secretions from the nose or throat. Chest X-ray. How is this treated? This condition may be treated with medicines, such as: Antiviral medicine. This may shorten the length of time a person has symptoms. Expectorants. These make it easier to cough up mucus. Decongestant nasal sprays. Acetaminophen or NSAIDs, such as ibuprofen, to relieve fever and pain. Antibiotic medicines are not prescribed for viral infections.This is because antibiotics are designed to kill bacteria. They do not kill viruses. Follow these instructions at home: Managing pain and congestion Take over-the-counter and prescription medicines only as told by your health care provider. If you have a sore throat, gargle with a mixture of salt and water 3-4 times a day or as needed. To make salt water, completely dissolve -1 tsp (3-6 g) of salt in 1 cup (237 mL) of warm water. Use nose drops made from salt water to ease congestion and soften raw skin around your nose. Take 2 tsp (10 mL) of honey at bedtime to lessen coughing at night. Do not give honey to children who are younger than 1 year. Drink enough fluid to keep your urine pale yellow. This helps prevent dehydration and helps loosen up mucus. General instructions  Rest as much as possible. Do not drink alcohol. Do not use any products that contain nicotine or tobacco. These products include cigarettes, chewing tobacco, and vaping devices, such as e-cigarettes. If you need help quitting,  ask your health care provider. Keep all follow-up visits. This is important. How is this prevented?     Get an annual flu shot. You may get the flu shot in late summer, fall, or winter. Ask your health care provider when you should get your flu shot. Avoid spreading your infection to other people. If you are sick: Wash your hands with soap and water often, especially after you cough or sneeze. Wash for at least 20 seconds. If soap and water are not available, use alcohol-based hand sanitizer. Cover your mouth when you cough. Cover your nose and mouth when you sneeze. Do not share cups or eating utensils. Clean commonly used objects often. Clean commonly touched surfaces. Stay home from work or school as told by your health care provider. Avoid contact with people who are sick during cold and flu season. This is generally fall and winter. Contact a health care provider if: Your symptoms last for  10 days or longer. Your symptoms get worse over time. You have severe sinus pain in your face or forehead. The glands in your jaw or neck become very swollen. You have shortness of breath. Get help right away if you: Feel pain or pressure in your chest. Have trouble breathing. Faint or feel like you will faint. Have severe and persistent vomiting. Feel confused or disoriented. These symptoms may represent a serious problem that is an emergency. Do not wait to see if the symptoms will go away. Get medical help right away. Call your local emergency services (911 in the [place] Do not drive yourself to the hospital. Summary A respiratory infection is an illness that affects part of the respiratory system, such as the lungs, nose, or throat. A respiratory infection that is caused by a virus is called a viral respiratory infection. Common types of viral respiratory infections include a cold, influenza, and respiratory syncytial virus (RSV) infection. Symptoms of this condition include a stuffy or runny  nose, cough, fatigue, achy muscles, sore throat, and fevers or chills. Antibiotic medicines are not prescribed for viral infections. This is because antibiotics are designed to kill bacteria. They are not effective against viruses. This information is not intended to replace advice given to you by your health care provider. Make sure you discuss any questions you have with your health care provider. Document Revised: 12/27/2020 Document Reviewed: 12/27/2020 Elsevier Patient Education  2024 Elsevier Inc.    If you have been instructed to have an in-person evaluation today at a local Urgent Care facility, please use the link below. It will take you to a list of all of our available [facility], including address, phone number and hours of operation. Please do not delay care.  [facility]  If you or a family member do not have a primary care provider, use the link below to schedule a visit and establish care. When you choose a [facility] primary care physician or advanced practice provider, you gain a long-term partner in health. Find a Primary Care Provider  Learn more about [facility]'s in-office and virtual care options: [facility] - Get Care Now

## 2023-11-23 ENCOUNTER — Ambulatory Visit: Payer: Self-pay

## 2023-12-01 ENCOUNTER — Ambulatory Visit: Payer: Self-pay

## 2023-12-02 ENCOUNTER — Ambulatory Visit: Payer: Self-pay

## 2024-01-27 ENCOUNTER — Ambulatory Visit: Payer: Self-pay

## 2024-01-28 ENCOUNTER — Ambulatory Visit
Admission: RE | Admit: 2024-01-28 | Discharge: 2024-01-28 | Disposition: A | Discharge status: Home / Self Care | Payer: Self-pay | Source: Ambulatory Visit

## 2024-01-28 VITALS — BP 122/75 | HR 88 | Temp 97.3°F | Resp 16 | Wt 175.0 lb

## 2024-01-28 DIAGNOSIS — H1032 Unspecified acute conjunctivitis, left eye: Secondary | ICD-10-CM

## 2024-01-28 DIAGNOSIS — H01014 Ulcerative blepharitis left upper eyelid: Secondary | ICD-10-CM

## 2024-01-28 MED ORDER — ERYTHROMYCIN 5 MG/GM OP OINT: TOPICAL_OINTMENT | OPHTHALMIC | 0 refills | Status: AC

## 2024-01-28 MED ORDER — AMOXICILLIN-POT CLAVULANATE 875-125 MG PO TABS: 1.0000 | ORAL_TABLET | Freq: Two times a day (BID) | ORAL | 0 refills | Status: AC

## 2024-01-28 NOTE — Discharge Instructions (Signed)
 Use erythromycin  eye ointment to lower lip eyelid of left eye twice daily for 5 days for treatment of conjunctivitis infection involving the inner eye.  For blepharitis which is an infection of the upper eyelid start Augmentin  take twice daily for 7 days.  Complete entire course of medication.  Apply warm compresses to reduce swelling.

## 2024-01-28 NOTE — ED Triage Notes (Signed)
 Left eye swollen in both corners ! Wasn't able to make previous appointment due to my job - Entered by patient  Pt c/o eye pain and swelling since 01/24/24 after attempting to install eyelashes. Pt denies contact with any known allergens but does state she tried a new brand of glue.

## 2024-01-28 NOTE — ED Provider Notes (Signed)
 [facility]    CSN: [id number] Arrival date & time: 01/28/24  1805      History   Chief Complaint Chief Complaint  Patient presents with   Eye Problem    Left eye swollen in both corners ! Wasn't able to make previous appointment due to my job - Entered by patient    HPI Nicole Love is a 33 y.o. female.    Eye Problem  Here with eye infection which has been present for 4 days.  Patient had instilled eyelashes and ripped them off and does not recall washing her hands.  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  Right upper eyelid is tender, reddened and swollen. Past Medical History:  Diagnosis Date   Hyperthyroidism    Pregnancy     Patient Active Problem List   Diagnosis Date Noted   Closed fracture of metacarpal bone of right hand 06/16/2023   Pain in right hand 04/30/2023   Post-dates pregnancy 10/14/2019   History of VBAC 08/03/2019   S/P tubal ligation 08/03/2019   Asymptomatic bacteriuria during pregnancy 03/28/2019   Supervision of other normal pregnancy, antepartum 03/23/2019   Thyroid  disease affecting pregnancy    Previous cesarean delivery, antepartum 12/16/2017   Hyperthyroidism 10/22/2017    Past Surgical History:  Procedure Laterality Date   CESAREAN SECTION     TUBAL LIGATION N/A 05/17/2018   Procedure: POST PARTUM TUBAL LIGATION;  Surgeon: Othelia Blinks, MD;  Location: [facility];  Service: Gynecology;  Laterality: N/A;    OB History     Gravida  6   Para  5   Term  5   Preterm  0   AB  1   Living  5      SAB  1   IAB  0   Ectopic  0   Multiple      Live Births  5            Home Medications    Prior to Admission medications   Medication Sig Start Date End Date Taking? Authorizing Provider  amoxicillin -clavulanate (AUGMENTIN ) 875-125 MG tablet Take 1 tablet by mouth every 12 (twelve) hours. 01/28/24  Yes Buena Carmine, NP   benzonatate  (TESSALON ) 100 MG capsule Take 1-2 capsules (100-200 mg total) by mouth 3 (three) times daily as needed. 09/10/23   Angelia Kelp, PA-C  brompheniramine-pseudoephedrine-DM 30-2-10 MG/5ML syrup Take 5 mLs by mouth 4 (four) times daily as needed. 09/10/23   Angelia Kelp, PA-C  cetirizine  (ZYRTEC ) 10 MG tablet Take 1 tablet (10 mg total) by mouth daily. 09/15/22   Dodson Freestone, FNP  cyclobenzaprine  (FLEXERIL ) 10 MG tablet     [provider]  cyclobenzaprine  (FLEXERIL ) 5 MG tablet Take 1 tablet (5 mg total) by mouth 3 (three) times daily as needed for muscle spasms. 04/21/23   Buena Carmine, NP  doxycycline  (VIBRAMYCIN ) 100 MG capsule Take 1 capsule (100 mg total) by mouth 2 (two) times daily. 09/15/22   Dodson Freestone, FNP  erythromycin  ophthalmic ointment 1 cm ribbon to affected eyelid BID x 5 days 01/28/24   Buena Carmine, NP  fluticasone  (FLONASE ) 50 MCG/ACT nasal spray Place 1 spray into both nostrils daily. 09/15/22   Dodson Freestone, FNP  ibuprofen  (ADVIL ) 600 MG tablet Take 1 tablet (600 mg total) by mouth every 6 (six) hours as needed. 04/03/22   Ethlyn Herd,  MD  ketoconazole  (NIZORAL ) 2 % shampoo Apply 1 Application topically 2 (two) times a week. Apply 5 to 10 mL to wet scalp, lather, leave on 3 to 5 minutes, and rinse 09/15/22   Mound, Taft E, FNP  naproxen  (NAPROSYN ) 375 MG tablet Take 1 tablet (375 mg total) by mouth 2 (two) times daily. 04/21/23   Buena Carmine, NP  ondansetron  (ZOFRAN ) 4 MG tablet Take 1 tablet (4 mg total) by mouth every 8 (eight) hours as needed for nausea or vomiting. 09/10/23   Angelia Kelp, PA-C  ferrous sulfate  (FERROUSUL) 325 (65 FE) MG tablet Take 1 tablet (325 mg total) by mouth 2 (two) times daily. Patient not taking: Reported on 02/08/2021 08/05/19 03/06/21  Jan Mcgill, MD    Family History Family History  Problem Relation Age of Onset   Diabetes Maternal Grandmother     Social History Social  History   Tobacco Use   Smoking status: Never   Smokeless tobacco: Never  Vaping Use   Vaping status: Never Used  Substance Use Topics   Alcohol use: No   Drug use: No     Allergies   Patient has no known allergies.   Review of Systems Review of Systems Pertinent negatives listed in HPI   Physical Exam Triage Vital Signs ED Triage Vitals  Encounter Vitals Group     BP 01/28/24 1910 122/75     Systolic BP Percentile --      Diastolic BP Percentile --      Pulse Rate 01/28/24 1910 88     Resp 01/28/24 1910 16     Temp 01/28/24 1910 (!) 97.3 F (36.3 C)     Temp Source 01/28/24 1910 Oral     SpO2 01/28/24 1910 98 %     Weight 01/28/24 1909 175 lb 0.7 oz (79.4 kg)     Height --      Head Circumference --      Peak Flow --      Pain Score 01/28/24 1908 7     Pain Loc --      Pain Education --      Exclude from Growth Chart --    No data found.  Updated Vital Signs BP 122/75 (BP Location: Left Arm)   Pulse 88   Temp (!) 97.3 F (36.3 C) (Oral)   Resp 16   Wt 175 lb 0.7 oz (79.4 kg)   LMP 01/09/2024 (Approximate)   SpO2 98%   BMI 27.42 kg/m   Visual Acuity Right Eye Distance:   Left Eye Distance:   Bilateral Distance:    Right Eye Near:   Left Eye Near:    Bilateral Near:     Physical Exam Vitals reviewed.  Constitutional:      Appearance: Normal appearance.  HENT:     Head: Normocephalic and atraumatic.     Nose: No congestion or rhinorrhea.  Eyes:     General:        Left eye: Discharge present.    Extraocular Movements: Extraocular movements intact.     Conjunctiva/sclera:     Right eye: Right conjunctiva is not injected. No chemosis, exudate or hemorrhage.    Left eye: Left conjunctiva is injected. Exudate and hemorrhage present.     Comments: Left upper lid swelling and erythema   Cardiovascular:     Rate and Rhythm: Normal rate and regular rhythm.  Pulmonary:     Effort: Pulmonary effort is normal.  Breath sounds: Normal breath  sounds.  Neurological:     Mental Status: She is alert.    UC Treatments / Results  Labs (all labs ordered are listed, but only abnormal results are displayed) Labs Reviewed - No data to display  EKG   Radiology No results found.  Procedures Procedures (including critical care time)  Medications Ordered in UC Medications - No data to display  Initial Impression / Assessment and Plan / UC Course  I have reviewed the triage vital signs and the nursing notes.  Pertinent labs & imaging results that were available during my care of the patient were reviewed by me and considered in my medical decision making (see chart for details).  Blepharitis ulcerated upper left eyelid treatment with Augmentin  twice daily for bacterial conjunctivitis involving left eye, erythromycin  eye ointment to the lower eyelid twice daily for 5 days.  Continue warm compresses.  Return precautions given if symptoms worsen or do not improve. Final C [facility] linical Impressions(s) / UC Diagnoses   Final diagnoses:  Ulcerative blepharitis of left upper eyelid  Acute bacterial conjunctivitis of left eye     Discharge Instructions      Use erythromycin  eye ointment to lower lip eyelid of left eye twice daily for 5 days for treatment of conjunctivitis infection involving the inner eye.  For blepharitis which is an infection of the upper eyelid start Augmentin  take twice daily for 7 days.  Complete entire course of medication.  Apply warm compresses to reduce swelling.     ED Prescriptions     Medication Sig Dispense Auth. Provider   erythromycin  ophthalmic ointment 1 cm ribbon to affected eyelid BID x 5 days 3.5 g Buena Carmine, NP   amoxicillin -clavulanate (AUGMENTIN ) 875-125 MG tablet Take 1 tablet by mouth every 12 (twelve) hours. 14 tablet Buena Carmine, NP      PDMP not reviewed this encounter.   Buena Carmine, NP 02/01/24 [phone]

## 2024-02-06 ENCOUNTER — Telehealth: Admitting: Family Medicine

## 2024-02-06 ENCOUNTER — Encounter

## 2024-02-06 DIAGNOSIS — N760 Acute vaginitis: Secondary | ICD-10-CM | POA: Diagnosis not present

## 2024-02-06 MED ORDER — FLUCONAZOLE 150 MG PO TABS: 150.0000 mg | ORAL_TABLET | Freq: Once | ORAL | 0 refills | Status: AC

## 2024-02-06 NOTE — Progress Notes (Signed)
 Virtual Visit Consent   Nicole Love, you are scheduled for a virtual visit with a [facility] provider today. Just as with appointments in the office, your consent must be obtained to participate. Your consent will be active for this visit and any virtual visit you may have with one of our providers in the next 365 days. If you have a MyChart account, a copy of this consent can be sent to you electronically.  As this is a virtual visit, video technology does not allow for your provider to perform a traditional examination. This may limit your provider's ability to fully assess your condition. If your provider identifies any concerns that need to be evaluated in person or the need to arrange testing (such as labs, EKG, etc.), we will make arrangements to do so. Although advances in technology are sophisticated, we cannot ensure that it will always work on either your end or our end. If the connection with a video visit is poor, the visit may have to be switched to a telephone visit. With either a video or telephone visit, we are not always able to ensure that we have a secure connection.  By engaging in this virtual visit, you consent to the provision of healthcare and authorize for your insurance to be billed (if applicable) for the services provided during this visit. Depending on your insurance coverage, you may receive a charge related to this service.  I need to obtain your verbal consent now. Are you willing to proceed with your visit today? Nicole Love has provided verbal consent on 02/06/2024 for a virtual visit (video or telephone). Nicole Love, [place]  Date: 02/06/2024 11:49 AM   Virtual Visit via Video Note   I, Nicole Love, connected with  Nicole Love  ([id number], Sep 07, 1991) on 02/06/24 at 11:45 AM EDT by a video-enabled telemedicine application and verified that I am speaking with the correct person using two identifiers.  Location: Patient: Virtual Visit Location Patient:  Home Provider: Virtual Visit Location Provider: Home Office   I discussed the limitations of evaluation and management by telemedicine and the availability of in person appointments. The patient expressed understanding and agreed to proceed.    History of Present Illness: Nicole Love is a 33 y.o. who identifies as a female who was assigned female at birth, and is being seen today for she recently was treated for an eye infection and treated with Amoxicillin  and now has yeast infection and BV.  Pt c/o vaginal discharge, burning with urination.  Pt states she has a white and clumpy discharge without an odor.  Pt states when she takes amoxicillin  she gets both yeast and BV infection. Pt states she is still taking amoxicillin .   HPI: HPI  Problems:  Patient Active Problem List   Diagnosis Date Noted   Closed fracture of metacarpal bone of right hand 06/16/2023   Pain in right hand 04/30/2023   Post-dates pregnancy 10/14/2019   History of VBAC 08/03/2019   S/P tubal ligation 08/03/2019   Asymptomatic bacteriuria during pregnancy 03/28/2019   Supervision of other normal pregnancy, antepartum 03/23/2019   Thyroid  disease affecting pregnancy    Previous cesarean delivery, antepartum 12/16/2017   Hyperthyroidism 10/22/2017    Allergies: No Known Allergies Medications:  Current Outpatient Medications:    fluconazole  (DIFLUCAN ) 150 MG tablet, Take 1 tablet (150 mg total) by mouth once for 1 dose. Take one tab orally today, if symptoms persist after 72 hours, may take second tab., Disp:  2 tablet, Rfl: 0   amoxicillin -clavulanate (AUGMENTIN ) 875-125 MG tablet, Take 1 tablet by mouth every 12 (twelve) hours., Disp: 14 tablet, Rfl: 0   benzonatate  (TESSALON ) 100 MG capsule, Take 1-2 capsules (100-200 mg total) by mouth 3 (three) times daily as needed., Disp: 30 capsule, Rfl: 0   brompheniramine-pseudoephedrine-DM 30-2-10 MG/5ML syrup, Take 5 mLs by mouth 4 (four) times daily as needed., Disp: 120  mL, Rfl: 0   cetirizine  (ZYRTEC ) 10 MG tablet, Take 1 tablet (10 mg total) by mouth daily., Disp: 30 tablet, Rfl: 0   cyclobenzaprine  (FLEXERIL ) 10 MG tablet, , Disp: , Rfl:    cyclobenzaprine  (FLEXERIL ) 5 MG tablet, Take 1 tablet (5 mg total) by mouth 3 (three) times daily as needed for muscle spasms., Disp: 20 tablet, Rfl: 0   doxycycline  (VIBRAMYCIN ) 100 MG capsule, Take 1 capsule (100 mg total) by mouth 2 (two) times daily., Disp: 20 capsule, Rfl: 0   erythromycin  ophthalmic ointment, 1 cm ribbon to affected eyelid BID x 5 days, Disp: 3.5 g, Rfl: 0   fluticasone  (FLONASE ) 50 MCG/ACT nasal spray, Place 1 spray into both nostrils daily., Disp: 16 g, Rfl: 0   ibuprofen  (ADVIL ) 600 MG tablet, Take 1 tablet (600 mg total) by mouth every 6 (six) hours as needed., Disp: 30 tablet, Rfl: 0   ketoconazole  (NIZORAL ) 2 % shampoo, Apply 1 Application topically 2 (two) times a week. Apply 5 to 10 mL to wet scalp, lather, leave on 3 to 5 minutes, and rinse, Disp: 120 mL, Rfl: 0   naproxen  (NAPROSYN ) 375 MG tablet, Take 1 tablet (375 mg total) by mouth 2 (two) times daily., Disp: 20 tablet, Rfl: 0   ondansetron  (ZOFRAN ) 4 MG tablet, Take 1 tablet (4 mg total) by mouth every 8 (eight) hours as needed for nausea or vomiting., Disp: 20 tablet, Rfl: 0  Observations/Objective: Patient is well-developed, well-nourished in no acute distress.  Resting comfortably at home.  Head is normocephalic, atraumatic.  No labored breathing.  Speech is clear and coherent with logical content.  Patient is alert and oriented at baseline.    Assessment and Plan: 1. Vaginosis (Primary) - fluconazole  (DIFLUCAN ) 150 MG tablet; Take 1 tablet (150 mg total) by mouth once for 1 dose. Take one tab orally today, if symptoms persist after 72 hours, may take second tab.  Dispense: 2 tablet; Refill: 0  -Start Fluconazole  -If symptoms persist, Pt to follow up with PCP or urgent care   Follow Up Instructions: I discussed the  assessment and treatment plan with the patient. The patient was provided an opportunity to ask questions and all were answered. The patient agreed with the plan and demonstrated an understanding of the instructions.  A copy of instructions were sent to the patient via MyChart unless otherwise noted below.    The patient was advised to call back or seek an in-person evaluation if the symptoms worsen or if the condition fails to improve as anticipated.    Nicole Roys, PA-C

## 2024-02-06 NOTE — Patient Instructions (Signed)
 Nicole Love, thank you for joining Nicole Roys, PA-C for today's virtual visit.  While this provider is not your primary care provider (PCP), if your PCP is located in our provider database this encounter information will be shared with them immediately following your visit.   A [facility] MyChart account gives you access to today's visit and all your visits, tests, and labs performed at [facility] " click here if you don't have a [facility] MyChart account or go to [email or web address]  Consent: (Patient) Nicole Love provided verbal consent for this virtual visit at the beginning of the encounter.  Current Medications:  Current Outpatient Medications:    fluconazole  (DIFLUCAN ) 150 MG tablet, Take 1 tablet (150 mg total) by mouth once for 1 dose. Take one tab orally today, if symptoms persist after 72 hours, may take second tab., Disp: 2 tablet, Rfl: 0   amoxicillin -clavulanate (AUGMENTIN ) 875-125 MG tablet, Take 1 tablet by mouth every 12 (twelve) hours., Disp: 14 tablet, Rfl: 0   benzonatate  (TESSALON ) 100 MG capsule, Take 1-2 capsules (100-200 mg total) by mouth 3 (three) times daily as needed., Disp: 30 capsule, Rfl: 0   brompheniramine-pseudoephedrine-DM 30-2-10 MG/5ML syrup, Take 5 mLs by mouth 4 (four) times daily as needed., Disp: 120 mL, Rfl: 0   cetirizine  (ZYRTEC ) 10 MG tablet, Take 1 tablet (10 mg total) by mouth daily., Disp: 30 tablet, Rfl: 0   cyclobenzaprine  (FLEXERIL ) 10 MG tablet, , Disp: , Rfl:    cyclobenzaprine  (FLEXERIL ) 5 MG tablet, Take 1 tablet (5 mg total) by mouth 3 (three) times daily as needed for muscle spasms., Disp: 20 tablet, Rfl: 0   doxycycline  (VIBRAMYCIN ) 100 MG capsule, Take 1 capsule (100 mg total) by mouth 2 (two) times daily., Disp: 20 capsule, Rfl: 0   erythromycin  ophthalmic ointment, 1 cm ribbon to affected eyelid BID x 5 days, Disp: 3.5 g, Rfl: 0   fluticasone  (FLONASE ) 50 MCG/ACT nasal spray, Place 1 spray into both  nostrils daily., Disp: 16 g, Rfl: 0   ibuprofen  (ADVIL ) 600 MG tablet, Take 1 tablet (600 mg total) by mouth every 6 (six) hours as needed., Disp: 30 tablet, Rfl: 0   ketoconazole  (NIZORAL ) 2 % shampoo, Apply 1 Application topically 2 (two) times a week. Apply 5 to 10 mL to wet scalp, lather, leave on 3 to 5 minutes, and rinse, Disp: 120 mL, Rfl: 0   naproxen  (NAPROSYN ) 375 MG tablet, Take 1 tablet (375 mg total) by mouth 2 (two) times daily., Disp: 20 tablet, Rfl: 0   ondansetron  (ZOFRAN ) 4 MG tablet, Take 1 tablet (4 mg total) by mouth every 8 (eight) hours as needed for nausea or vomiting., Disp: 20 tablet, Rfl: 0   Medications ordered in this encounter:  Meds ordered this encounter  Medications   fluconazole  (DIFLUCAN ) 150 MG tablet    Sig: Take 1 tablet (150 mg total) by mouth once for 1 dose. Take one tab orally today, if symptoms persist after 72 hours, may take second tab.    Dispense:  2 tablet    Refill:  0     *If you need refills on other medications prior to your next appointment, please contact your pharmacy*  Follow-Up: Call back or seek an in-person evaluation if the symptoms worsen or if the condition fails to improve as anticipated.   Virtual Care [phone]  Other Instructions Vaginal Yeast Infection, Adult  Vaginal yeast infection is a condition that causes vaginal discharge as well  as soreness, swelling, and redness (inflammation) of the vagina. This is a common condition. Some women get this infection frequently. What are the causes? This condition is caused by a change in the normal balance of the yeast (Candida) and normal bacteria that live in the vagina. This change causes an overgrowth of yeast, which causes the inflammation. What increases the risk? The condition is more likely to develop in women who: Take antibiotic medicines. Have diabetes. Take birth control pills. Are pregnant. Douche often. Have a weak body defense system (immune  system). Have been taking steroid medicines for a long time. Frequently wear tight clothing. What are the signs or symptoms? Symptoms of this condition include: White, thick, creamy vaginal discharge. Swelling, itching, redness, and irritation of the vagina. The lips of the vagina (labia) may be affected as well. Pain or a burning feeling while urinating. Pain during sex. How is this diagnosed? This condition is diagnosed based on: Your medical history. A physical exam. A pelvic exam. Your health care provider will examine a sample of your vaginal discharge under a microscope. Your health care provider may send this sample for testing to confirm the diagnosis. How is this treated? This condition is treated with medicine. Medicines may be over-the-counter or prescription. You may be told to use one or more of the following: Medicine that is taken by mouth (orally). Medicine that is applied as a cream (topically). Medicine that is inserted directly into the vagina (suppository). Follow these instructions at home: Take or apply over-the-counter and prescription medicines only as told by your health care provider. Do not use tampons until your health care provider approves. Do not have sex until your infection has cleared. Sex can prolong or worsen your symptoms of infection. Ask your health care provider when it is safe to resume sexual activity. Keep all follow-up visits. This is important. How is this prevented?  Do not wear tight clothes, such as pantyhose or tight pants. Wear breathable cotton underwear. Do not use douches, perfumed soap, creams, or powders. Wipe from front to back after using the toilet. If you have diabetes, keep your blood sugar levels under control. Ask your health care provider for other ways to prevent yeast infections. Contact a health care provider if: You have a fever. Your symptoms go away and then return. Your symptoms do not get better with  treatment. Your symptoms get worse. You have new symptoms. You develop blisters in or around your vagina. You have blood coming from your vagina and it is not your menstrual period. You develop pain in your abdomen. Summary Vaginal yeast infection is a condition that causes discharge as well as soreness, swelling, and redness (inflammation) of the vagina. This condition is treated with medicine. Medicines may be over-the-counter or prescription. Take or apply over-the-counter and prescription medicines only as told by your health care provider. Do not douche. Resume sexual activity or use of tampons as instructed by your health care provider. Contact a health care provider if your symptoms do not get better with treatment or your symptoms go away and then return. This information is not intended to replace advice given to you by your health care provider. Make sure you discuss any questions you have with your health care provider. Document Revised: 12/10/2020 Document Reviewed: 12/10/2020 Elsevier Patient Education  2024 [employer]   If you have been instructed to have an in-person evaluation today at a local Urgent Care facility, please use the link below. It will take you  to a list of all of our available [facility], including address, phone number and hours of operation. Please do not delay care.  [facility]  If you or a family member do not have a primary care provider, use the link below to schedule a visit and establish care. When you choose a [facility] primary care physician or advanced practice provider, you gain a long-term partner in health. Find a Primary Care Provider  Learn more about [facility]'s in-office and virtual care options:  - Get Care Now

## 2024-03-03 ENCOUNTER — Telehealth

## 2024-03-14 ENCOUNTER — Encounter: Payer: Self-pay | Admitting: Family

## 2024-03-14 ENCOUNTER — Encounter: Admitting: Family

## 2024-03-14 NOTE — Progress Notes (Signed)
 Erroneous encounter-disregard

## 2024-04-05 ENCOUNTER — Ambulatory Visit: Admitting: Family

## 2024-06-28 ENCOUNTER — Ambulatory Visit: Payer: Self-pay
